# Patient Record
Sex: Female | Born: 1950 | Race: Black or African American | Hispanic: No | Smoking: Never smoker
Health system: Southern US, Community
[De-identification: ages and names within clinical notes are randomized; demographics above are authoritative.]

## PROBLEM LIST (undated history)

## (undated) DIAGNOSIS — F410 Panic disorder [episodic paroxysmal anxiety] without agoraphobia: Secondary | ICD-10-CM

## (undated) DIAGNOSIS — Z9889 Other specified postprocedural states: Secondary | ICD-10-CM

## (undated) DIAGNOSIS — J45909 Unspecified asthma, uncomplicated: Secondary | ICD-10-CM

## (undated) DIAGNOSIS — M1990 Unspecified osteoarthritis, unspecified site: Secondary | ICD-10-CM

## (undated) DIAGNOSIS — M8580 Other specified disorders of bone density and structure, unspecified site: Secondary | ICD-10-CM

## (undated) DIAGNOSIS — F32A Depression, unspecified: Secondary | ICD-10-CM

## (undated) DIAGNOSIS — E669 Obesity, unspecified: Secondary | ICD-10-CM

## (undated) DIAGNOSIS — I10 Essential (primary) hypertension: Secondary | ICD-10-CM

## (undated) DIAGNOSIS — K219 Gastro-esophageal reflux disease without esophagitis: Secondary | ICD-10-CM

## (undated) DIAGNOSIS — K649 Unspecified hemorrhoids: Secondary | ICD-10-CM

## (undated) DIAGNOSIS — R002 Palpitations: Secondary | ICD-10-CM

## (undated) DIAGNOSIS — E7800 Pure hypercholesterolemia, unspecified: Secondary | ICD-10-CM

## (undated) DIAGNOSIS — F419 Anxiety disorder, unspecified: Secondary | ICD-10-CM

## (undated) DIAGNOSIS — F329 Major depressive disorder, single episode, unspecified: Secondary | ICD-10-CM

## (undated) DIAGNOSIS — F4310 Post-traumatic stress disorder, unspecified: Secondary | ICD-10-CM

## (undated) DIAGNOSIS — T7840XA Allergy, unspecified, initial encounter: Secondary | ICD-10-CM

## (undated) HISTORY — DX: Palpitations: R00.2

## (undated) HISTORY — DX: Anxiety disorder, unspecified: F41.9

## (undated) HISTORY — PX: EYE SURGERY: SHX253

## (undated) HISTORY — DX: Allergy, unspecified, initial encounter: T78.40XA

## (undated) HISTORY — DX: Major depressive disorder, single episode, unspecified: F32.9

## (undated) HISTORY — DX: Other specified disorders of bone density and structure, unspecified site: M85.80

## (undated) HISTORY — DX: Essential (primary) hypertension: I10

## (undated) HISTORY — DX: Depression, unspecified: F32.A

## (undated) HISTORY — DX: Other specified postprocedural states: Z98.890

## (undated) HISTORY — DX: Unspecified asthma, uncomplicated: J45.909

## (undated) HISTORY — DX: Unspecified osteoarthritis, unspecified site: M19.90

## (undated) HISTORY — PX: KNEE SURGERY: SHX244

## (undated) HISTORY — DX: Unspecified hemorrhoids: K64.9

---

## 1998-06-30 ENCOUNTER — Other Ambulatory Visit: Admission: RE | Admit: 1998-06-30 | Discharge: 1998-06-30 | Payer: Self-pay | Admitting: Family Medicine

## 2000-03-20 ENCOUNTER — Emergency Department (HOSPITAL_COMMUNITY): Admission: EM | Admit: 2000-03-20 | Discharge: 2000-03-20 | Payer: Self-pay

## 2001-03-22 ENCOUNTER — Other Ambulatory Visit: Admission: RE | Admit: 2001-03-22 | Discharge: 2001-03-22 | Payer: Self-pay | Admitting: Family Medicine

## 2002-05-23 ENCOUNTER — Encounter (INDEPENDENT_AMBULATORY_CARE_PROVIDER_SITE_OTHER): Payer: Self-pay | Admitting: Specialist

## 2002-05-23 ENCOUNTER — Ambulatory Visit (HOSPITAL_COMMUNITY): Admission: RE | Admit: 2002-05-23 | Discharge: 2002-05-23 | Payer: Self-pay | Admitting: *Deleted

## 2003-01-08 ENCOUNTER — Encounter: Payer: Self-pay | Admitting: Orthopaedic Surgery

## 2003-01-08 ENCOUNTER — Ambulatory Visit (HOSPITAL_COMMUNITY): Admission: RE | Admit: 2003-01-08 | Discharge: 2003-01-08 | Payer: Self-pay | Admitting: Orthopaedic Surgery

## 2004-07-12 ENCOUNTER — Ambulatory Visit: Payer: Self-pay | Admitting: Internal Medicine

## 2004-07-19 ENCOUNTER — Ambulatory Visit: Payer: Self-pay

## 2004-07-19 ENCOUNTER — Ambulatory Visit: Payer: Self-pay | Admitting: Internal Medicine

## 2004-08-19 ENCOUNTER — Ambulatory Visit: Payer: Self-pay | Admitting: Internal Medicine

## 2005-03-03 ENCOUNTER — Other Ambulatory Visit: Admission: RE | Admit: 2005-03-03 | Discharge: 2005-03-03 | Payer: Self-pay | Admitting: Family Medicine

## 2005-04-27 ENCOUNTER — Ambulatory Visit (HOSPITAL_COMMUNITY): Admission: RE | Admit: 2005-04-27 | Discharge: 2005-04-27 | Payer: Self-pay | Admitting: Gastroenterology

## 2006-06-16 LAB — HM COLONOSCOPY: HM Colonoscopy: NORMAL

## 2006-08-21 ENCOUNTER — Encounter: Admission: RE | Admit: 2006-08-21 | Discharge: 2006-08-21 | Payer: Self-pay | Admitting: Family Medicine

## 2007-09-19 ENCOUNTER — Encounter: Admission: RE | Admit: 2007-09-19 | Discharge: 2007-09-19 | Payer: Self-pay | Admitting: Nurse Practitioner

## 2007-09-27 ENCOUNTER — Encounter: Admission: RE | Admit: 2007-09-27 | Discharge: 2007-09-27 | Payer: Self-pay | Admitting: Family Medicine

## 2008-06-24 LAB — HM PAP SMEAR: HM Pap smear: NORMAL

## 2008-10-14 LAB — HM MAMMOGRAPHY: HM Mammogram: NORMAL

## 2008-10-17 ENCOUNTER — Encounter: Admission: RE | Admit: 2008-10-17 | Discharge: 2008-10-17 | Payer: Self-pay | Admitting: Family Medicine

## 2009-10-19 ENCOUNTER — Encounter: Admission: RE | Admit: 2009-10-19 | Discharge: 2009-10-19 | Payer: Self-pay | Admitting: Family Medicine

## 2009-10-27 ENCOUNTER — Encounter: Admission: RE | Admit: 2009-10-27 | Discharge: 2009-10-27 | Payer: Self-pay | Admitting: Nurse Practitioner

## 2009-12-28 ENCOUNTER — Ambulatory Visit (HOSPITAL_COMMUNITY): Admission: RE | Admit: 2009-12-28 | Discharge: 2009-12-28 | Payer: Self-pay | Admitting: General Surgery

## 2009-12-28 ENCOUNTER — Encounter: Admission: RE | Admit: 2009-12-28 | Discharge: 2009-12-28 | Payer: Self-pay | Admitting: General Surgery

## 2010-06-06 ENCOUNTER — Encounter: Payer: Self-pay | Admitting: Family Medicine

## 2010-07-30 LAB — COMPREHENSIVE METABOLIC PANEL
Albumin: 3.5 g/dL (ref 3.5–5.2)
GFR calc Af Amer: >60 mL/min (ref >60)
Glucose, Bld: 100 mg/dL — ABNORMAL HIGH (ref 70–99)
Potassium: 4.9 mEq/L (ref 3.5–5.1)

## 2010-07-30 LAB — CBC
HCT: 41.6 % (ref 36.0–46.0)
Hemoglobin: 13.9 g/dL (ref 12.0–15.0)
MCH: 29.3 pg (ref 26.0–34.0)
MCV: 87.6 fL (ref 78.0–100.0)
Platelets: 219 10*3/uL (ref 150–400)
RBC: 4.75 MIL/uL (ref 3.87–5.11)
RDW: 14.1 % (ref 11.5–15.5)
WBC: 5.8 10*3/uL (ref 4.0–10.5)

## 2010-07-30 LAB — SURGICAL PCR SCREEN: MRSA, PCR: NEGATIVE

## 2010-07-30 LAB — DIFFERENTIAL
Eosinophils Relative: 1 % (ref 0–5)
Monocytes Absolute: 0.7 10*3/uL (ref 0.1–1.0)
Monocytes Relative: 12 % (ref 3–12)

## 2010-09-21 ENCOUNTER — Encounter: Payer: Self-pay | Admitting: Nurse Practitioner

## 2010-09-21 DIAGNOSIS — K222 Esophageal obstruction: Secondary | ICD-10-CM

## 2010-09-21 DIAGNOSIS — E785 Hyperlipidemia, unspecified: Secondary | ICD-10-CM | POA: Insufficient documentation

## 2010-09-21 DIAGNOSIS — F32A Depression, unspecified: Secondary | ICD-10-CM | POA: Insufficient documentation

## 2010-09-21 DIAGNOSIS — F329 Major depressive disorder, single episode, unspecified: Secondary | ICD-10-CM | POA: Insufficient documentation

## 2010-09-21 DIAGNOSIS — K649 Unspecified hemorrhoids: Secondary | ICD-10-CM | POA: Insufficient documentation

## 2010-09-21 DIAGNOSIS — E669 Obesity, unspecified: Secondary | ICD-10-CM | POA: Insufficient documentation

## 2010-09-21 DIAGNOSIS — M519 Unspecified thoracic, thoracolumbar and lumbosacral intervertebral disc disorder: Secondary | ICD-10-CM | POA: Insufficient documentation

## 2010-09-21 DIAGNOSIS — J45909 Unspecified asthma, uncomplicated: Secondary | ICD-10-CM | POA: Insufficient documentation

## 2010-09-21 DIAGNOSIS — K224 Dyskinesia of esophagus: Secondary | ICD-10-CM | POA: Insufficient documentation

## 2010-09-21 DIAGNOSIS — M1990 Unspecified osteoarthritis, unspecified site: Secondary | ICD-10-CM

## 2010-09-21 DIAGNOSIS — M8580 Other specified disorders of bone density and structure, unspecified site: Secondary | ICD-10-CM

## 2010-09-21 DIAGNOSIS — I10 Essential (primary) hypertension: Secondary | ICD-10-CM | POA: Insufficient documentation

## 2010-09-22 ENCOUNTER — Other Ambulatory Visit (HOSPITAL_COMMUNITY): Payer: Self-pay | Admitting: Orthopaedic Surgery

## 2010-09-22 DIAGNOSIS — M25571 Pain in right ankle and joints of right foot: Secondary | ICD-10-CM

## 2010-09-27 ENCOUNTER — Ambulatory Visit (HOSPITAL_COMMUNITY)
Admission: RE | Admit: 2010-09-27 | Discharge: 2010-09-27 | Disposition: A | Discharge status: Home / Self Care | Payer: PRIVATE HEALTH INSURANCE | Source: Ambulatory Visit | Attending: Orthopaedic Surgery | Admitting: Orthopaedic Surgery

## 2010-09-27 DIAGNOSIS — M25571 Pain in right ankle and joints of right foot: Secondary | ICD-10-CM

## 2010-09-27 DIAGNOSIS — M7750 Other enthesopathy of unspecified foot: Secondary | ICD-10-CM | POA: Insufficient documentation

## 2010-09-27 DIAGNOSIS — M25579 Pain in unspecified ankle and joints of unspecified foot: Secondary | ICD-10-CM | POA: Insufficient documentation

## 2010-10-01 NOTE — Op Note (Signed)
NAMESYNTHIA, FAIRBANK             ACCOUNT NO.:  [id number]   MEDICAL RECORD NO.:  [id number]          PATIENT TYPE:  AMB   LOCATION:  ENDO                         FACILITY:  [facility]   PHYSICIAN:  Shirley Friar, MDDATE OF BIRTH:  09/28/1950   DATE OF PROCEDURE:  04/27/2005  DATE OF DISCHARGE:                                 OPERATIVE REPORT   REFERRING PHYSICIAN:  Magnus Sinning. Rice, M.D.   INDICATIONS:  Heme-positive stool.   MEDICATIONS:  Fentanyl 100 mcg, Versed 10 mg.   FINDINGS:  Rectal exam revealed small external hemorrhoids that were not  bleeding but on attempted colonoscope insertion, there was bright red blood  on one of the external hemorrhoids that spontaneously resolved.  Digital  rectal exam was normal.  A pediatric adjustable colonoscope was inserted  into a fair-prep colon and advanced to the cecum, where the ileocecal valve  and appendiceal orifice were identified.  The terminal ileum was intubated  and was normal in appearance.  On careful withdrawal, the colonoscope  revealed no polyps and no mucosal abnormalities.  Retroflexion was done and  revealed small internal hemorrhoids.  The insufflated air was withdrawn as  much as possible on withdrawal.  The colonoscope was withdrawn and confirmed  the above findings.   ASSESSMENT:  1.  External and internal hemorrhoids.  2.  Heme-positive stool, likely due to hemorrhoids or from digital rectal      exam performed in the office.  Patient would likely benefit from repeat      Hemoccult check on spontaneously passed stools.   PLAN:  1.  High fiber diet.  2.  Hemorrhoidal cream, as needed.  3.  Check CBC and do a Hemoccult check on spontaneously passed stools.  If      this Hemoccult check is positive or if anemic, then will consider upper      endoscopy, but at this time, I see no indication to do an upper      endoscopy.  4.  Repeat colonoscopy in five years.  5.  Patient should follow up with her primary  care physician to have      Hemoccult cards checked and blood count done.  Will follow up with me as      needed.      Shirley Friar, MD  Electronically Signed     VCS/MEDQ  D:  04/27/2005  T:  04/27/2005  Job:  [id number]   cc:   Magnus Sinning. Rice, M.D.  Fax: [phone]

## 2011-02-22 ENCOUNTER — Other Ambulatory Visit (HOSPITAL_COMMUNITY): Payer: Self-pay | Admitting: Orthopaedic Surgery

## 2011-02-22 DIAGNOSIS — M25562 Pain in left knee: Secondary | ICD-10-CM

## 2011-02-24 ENCOUNTER — Ambulatory Visit (HOSPITAL_COMMUNITY)
Admission: RE | Admit: 2011-02-24 | Discharge: 2011-02-24 | Disposition: A | Discharge status: Home / Self Care | Payer: PRIVATE HEALTH INSURANCE | Source: Ambulatory Visit | Attending: Orthopaedic Surgery | Admitting: Orthopaedic Surgery

## 2011-02-24 DIAGNOSIS — M25562 Pain in left knee: Secondary | ICD-10-CM

## 2011-02-24 DIAGNOSIS — M25569 Pain in unspecified knee: Secondary | ICD-10-CM | POA: Insufficient documentation

## 2012-08-08 ENCOUNTER — Ambulatory Visit (INDEPENDENT_AMBULATORY_CARE_PROVIDER_SITE_OTHER): Payer: Medicare Other | Admitting: Nurse Practitioner

## 2012-08-08 ENCOUNTER — Encounter: Payer: Self-pay | Admitting: Nurse Practitioner

## 2012-08-08 VITALS — BP 132/88 | HR 89 | Temp 96.6°F | Ht 66.0 in | Wt 273.0 lb

## 2012-08-08 DIAGNOSIS — I10 Essential (primary) hypertension: Secondary | ICD-10-CM

## 2012-08-08 DIAGNOSIS — F329 Major depressive disorder, single episode, unspecified: Secondary | ICD-10-CM

## 2012-08-08 DIAGNOSIS — E669 Obesity, unspecified: Secondary | ICD-10-CM

## 2012-08-08 DIAGNOSIS — E785 Hyperlipidemia, unspecified: Secondary | ICD-10-CM

## 2012-08-08 DIAGNOSIS — F411 Generalized anxiety disorder: Secondary | ICD-10-CM

## 2012-08-08 DIAGNOSIS — G4700 Insomnia, unspecified: Secondary | ICD-10-CM

## 2012-08-08 LAB — COMPLETE METABOLIC PANEL WITH GFR
AST: 12 U/L (ref 0–37)
Albumin: 3.9 g/dL (ref 3.5–5.2)
Alkaline Phosphatase: 51 U/L (ref 39–117)
BUN: 11 mg/dL (ref 6–23)
CO2: 24 mEq/L (ref 19–32)
Creat: 0.92 mg/dL (ref 0.50–1.10)
GFR, Est African American: 78 mL/min
GFR, Est Non African American: 67 mL/min
Glucose, Bld: 103 mg/dL — ABNORMAL HIGH (ref 70–99)
Potassium: 4.2 mEq/L (ref 3.5–5.3)
Sodium: 134 mEq/L — ABNORMAL LOW (ref 135–145)
Total Protein: 6.5 g/dL (ref 6.0–8.3)

## 2012-08-08 MED ORDER — ALPRAZOLAM 0.5 MG PO TABS: 0.5000 mg | ORAL_TABLET | Freq: Three times a day (TID) | ORAL | Status: DC | PRN

## 2012-08-08 NOTE — Progress Notes (Signed)
Subjective:    Patient ID: Lauren Harmon, female    DOB: 09/15/1950, 62 y.o.   MRN: [id number]  Hypertension This is a chronic problem. The current episode started more than 1 year ago. The problem has been waxing and waning since onset. The problem is controlled. Pertinent negatives include no blurred vision, chest pain, headaches, palpitations, peripheral edema or shortness of breath. Risk factors for coronary artery disease include dyslipidemia, obesity and post-menopausal state. Past treatments include beta blockers, calcium channel blockers and diuretics. The current treatment provides moderate improvement. Compliance problems include diet and exercise.   Hyperlipidemia This is a chronic problem. The problem is controlled. She has no history of obesity. There are no known factors aggravating her hyperlipidemia. Pertinent negatives include no chest pain, leg pain, myalgias or shortness of breath. Current antihyperlipidemic treatment includes statins. The current treatment provides significant improvement of lipids. Compliance problems include adherence to diet and adherence to exercise.  Risk factors for coronary artery disease include hypertension.  Asthma There is no cough, hoarse voice, shortness of breath or wheezing. This is a chronic problem. The current episode started more than 1 year ago. The problem has been unchanged. Pertinent negatives include no chest pain, headaches, myalgias, rhinorrhea, sneezing, sore throat or trouble swallowing. Her symptoms are aggravated by lying down. Her symptoms are alleviated by nothing. Her past medical history is significant for asthma.  Insomnia Valium 5mg   2 QHS and patient says it is not helping. Wakes up at night. Gets jittery GAD Valium during the day doesn't help. Still stays very nervous. Wants to try something different.  Depression Venlafaxine 75mg  TID. Working ok. May change dose later so doesn't have to take 3X per day.   Review of  Systems  Constitutional: Negative.   HENT: Negative.  Negative for sore throat, hoarse voice, rhinorrhea, sneezing and trouble swallowing.   Eyes: Negative.  Negative for blurred vision.  Respiratory: Negative for cough, shortness of breath and wheezing.   Cardiovascular: Negative for chest pain and palpitations.  Gastrointestinal: Positive for nausea (patient describes as a nervous stomach).  Genitourinary: Negative.   Musculoskeletal: Negative.  Negative for myalgias.  Skin: Negative.   Allergic/Immunologic: Negative.   Neurological: Negative for headaches.  Psychiatric/Behavioral: Positive for sleep disturbance and agitation.   Allergies  Allergen Reactions  . Wellbutrin (Bupropion Hcl) Nausea Only    Outpatient Encounter Prescriptions as of 08/08/2012  Medication Sig Dispense Refill  . albuterol (PROAIR HFA) 108 (90 BASE) MCG/ACT inhaler Inhale 2 puffs into the lungs every 6 (six) hours as needed.        Marland Kitchen amLODipine-benazepril (LOTREL) 5-20 MG per capsule Take 1 capsule by mouth daily.        Marland Kitchen aspirin 325 MG tablet Take 325 mg by mouth daily.        Marland Kitchen atorvastatin (LIPITOR) 40 MG tablet Take 40 mg by mouth daily.        . cyclobenzaprine (FLEXERIL) 10 MG tablet Take 10 mg by mouth 3 (three) times daily as needed.        . diazepam (VALIUM) 5 MG tablet Take 5 mg by mouth every 12 (twelve) hours as needed.        Marland Kitchen esomeprazole (NEXIUM) 40 MG capsule Take 40 mg by mouth daily before breakfast.        . furosemide (LASIX) 20 MG tablet Take 20 mg by mouth 2 (two) times daily.        Marland Kitchen HYDROcodone-acetaminophen (VICODIN ES) 7.5-750 MG  per tablet Take 1 tablet by mouth every 8 (eight) hours as needed.        . montelukast (SINGULAIR) 10 MG tablet Take 10 mg by mouth at bedtime.       . theophylline (UNIPHYL) 400 MG 24 hr tablet Take 400 mg by mouth daily.        Marland Kitchen venlafaxine (EFFEXOR-XR) 150 MG 24 hr capsule Take 150 mg by mouth daily.        . celecoxib (CELEBREX) 200 MG capsule  Take 200 mg by mouth 2 (two) times daily.         No facility-administered encounter medications on file as of 08/08/2012.    Past Medical History  Diagnosis Date  . Anxiety   . H/O: knee surgery   . Hypertension   . Allergy   . Depression   . Asthma   . Osteoarthritis   . Palpitations   . Osteopenia   . Hemorrhoid     Past Surgical History  Procedure Laterality Date  . Eye surgery      History   Social History  . Marital Status: Divorced    Spouse Name: N/A    Number of Children: N/A  . Years of Education: N/A   Occupational History  . Not on file.   Social History Main Topics  . Smoking status: Never Smoker   . Smokeless tobacco: Not on file  . Alcohol Use: No  . Drug Use: No  . Sexually Active: No   Other Topics Concern  . Not on file   Social History Narrative  . No narrative on file          Objective:   Physical Exam  Constitutional: She is oriented to person, place, and time. She appears well-nourished.  HENT:  Head: Normocephalic.  Right Ear: External ear normal.  Left Ear: External ear normal.  Nose: Nose normal.  Mouth/Throat: Oropharynx is clear and moist.  Eyes: Conjunctivae and EOM are normal. Pupils are equal, round, and reactive to light.  Neck: Normal range of motion. Neck supple. No JVD present. Carotid bruit is not present.  Cardiovascular: Normal rate, regular rhythm, normal heart sounds and intact distal pulses.   Pulmonary/Chest: Effort normal and breath sounds normal.  Abdominal: Soft. Bowel sounds are normal. She exhibits no mass. There is no tenderness.  Musculoskeletal: Normal range of motion.  Lymphadenopathy:    She has no cervical adenopathy.  Neurological: She is alert and oriented to person, place, and time.  Skin: Skin is warm and dry.  Psychiatric: Her behavior is normal. Judgment and thought content normal.  Affect sad.    BP 146/89  Pulse 89  Temp(Src) 96.6 F (35.9 C) (Oral)  LMP 08/09/2002         Assessment & Plan:  Health Maintenance updated .Hypertension - Plan: COMPLETE METABOLIC PANEL WITH GFR  Hyperlipidemia - Plan: NMR Lipoprofile with Lipids  Depression  Obesity  GAD (generalized anxiety disorder) - Plan: ALPRAZolam (XANAX) 0.5 MG tablet  Insomnia - Plan: ALPRAZolam (XANAX) 0.5 MG tablet  Continue all meds Valium Changed to xanax- Patient will let me know if helps Stress management discussed Bedtime ritual discussed  Mary-Margaret [name], FNP

## 2012-08-08 NOTE — Patient Instructions (Signed)
Diet for Gastroesophageal Reflux Disease, Adult Reflux (acid reflux) is when acid from your stomach flows up into the esophagus. When acid comes in contact with the esophagus, the acid causes irritation and soreness (inflammation) in the esophagus. When reflux happens often or so severely that it causes damage to the esophagus, it is called gastroesophageal reflux disease (GERD). Nutrition therapy can help ease the discomfort of GERD. FOODS OR DRINKS TO AVOID OR LIMIT  Smoking or chewing tobacco. Nicotine is one of the most potent stimulants to acid production in the gastrointestinal tract.  Caffeinated and decaffeinated coffee and black tea.  Regular or low-calorie carbonated beverages or energy drinks (caffeine-free carbonated beverages are allowed).   Strong spices, such as black pepper, white pepper, red pepper, cayenne, curry powder, and chili powder.  Peppermint or spearmint.  Chocolate.  High-fat foods, including meats and fried foods. Extra added fats including oils, butter, salad dressings, and nuts. Limit these to less than 8 tsp per day.  Fruits and vegetables if they are not tolerated, such as citrus fruits or tomatoes.  Alcohol.  Any food that seems to aggravate your condition. If you have questions regarding your diet, call your caregiver or a registered dietitian. OTHER THINGS THAT MAY HELP GERD INCLUDE:   Eating your meals slowly, in a relaxed setting.  Eating 5 to 6 small meals per day instead of 3 large meals.  Eliminating food for a period of time if it causes distress.  Not lying down until 3 hours after eating a meal.  Keeping the head of your bed raised 6 to 9 inches (15 to 23 cm) by using a foam wedge or blocks under the legs of the bed. Lying flat may make symptoms worse.  Being physically active. Weight loss may be helpful in reducing reflux in overweight or obese adults.  Wear loose fitting clothing EXAMPLE MEAL PLAN This meal plan is approximately  2,000 calories based on https://www.bernard.org/ meal planning guidelines. Breakfast   cup cooked oatmeal.  1 cup strawberries.  1 cup low-fat milk.  1 oz almonds. Snack  1 cup cucumber slices.  6 oz yogurt (made from low-fat or fat-free milk). Lunch  2 slice whole-wheat bread.  2 oz sliced [place].  2 tsp mayonnaise.  1 cup blueberries.  1 cup snap peas. Snack  6 whole-wheat crackers.  1 oz string cheese. Dinner   cup brown rice.  1 cup mixed veggies.  1 tsp olive oil.  3 oz grilled fish. Document Released: 05/02/2005 Document Revised: 07/25/2011 Document Reviewed: 03/18/2011 [facility] Patient Information 2013 [place], [place]. Depression, Adult Depression refers to feeling sad, low, down in the dumps, blue, gloomy, or empty. In general, there are two kinds of depression: 1. Depression that we all experience from time to time because of upsetting life experiences, including the loss of a job or the ending of a relationship (normal sadness or normal grief). This kind of depression is considered normal, is short lived, and resolves within a few days to 2 weeks. (Depression experienced after the loss of a loved one is called bereavement. Bereavement often lasts longer than 2 weeks but normally gets better with time.) 2. Clinical depression, which lasts longer than normal sadness or normal grief or interferes with your ability to function at home, at work, and in school. It also interferes with your personal relationships. It affects almost every aspect of your life. Clinical depression is an illness. Symptoms of depression also can be caused by conditions other than normal sadness  and grief or clinical depression. Examples of these conditions are listed as follows:  Physical illness Some physical illnesses, including underactive thyroid gland (hypothyroidism), severe anemia, specific types of cancer, diabetes, uncontrolled seizures, heart and lung problems, strokes, and chronic  pain are commonly associated with symptoms of depression.  Side effects of some prescription medicine In some people, certain types of prescription medicine can cause symptoms of depression.  Substance abuse Abuse of alcohol and illicit drugs can cause symptoms of depression. SYMPTOMS Symptoms of normal sadness and normal grief include the following:  Feeling sad or crying for short periods of time.  Not caring about anything (apathy).  Difficulty sleeping or sleeping too much.  No longer able to enjoy the things you used to enjoy.  Desire to be by oneself all the time (social isolation).  Lack of energy or motivation.  Difficulty concentrating or remembering.  Change in appetite or weight.  Restlessness or agitation. Symptoms of clinical depression include the same symptoms of normal sadness or normal grief and also the following symptoms:  Feeling sad or crying all the time.  Feelings of guilt or worthlessness.  Feelings of hopelessness or helplessness.  Thoughts of suicide or the desire to harm yourself (suicidal ideation).  Loss of touch with reality (psychotic symptoms). Seeing or hearing things that are not real (hallucinations) or having false beliefs about your life or the people around you (delusions and paranoia). DIAGNOSIS  The diagnosis of clinical depression usually is based on the severity and duration of the symptoms. Your caregiver also will ask you questions about your medical history and substance use to find out if physical illness, use of prescription medicine, or substance abuse is causing your depression. Your caregiver also may order blood tests. TREATMENT  Typically, normal sadness and normal grief do not require treatment. However, sometimes antidepressant medicine is prescribed for bereavement to ease the depressive symptoms until they resolve. The treatment for clinical depression depends on the severity of your symptoms but typically includes  antidepressant medicine, counseling with a mental health professional, or a combination of both. Your caregiver will help to determine what treatment is best for you. Depression caused by physical illness usually goes away with appropriate medical treatment of the illness. If prescription medicine is causing depression, talk with your caregiver about stopping the medicine, decreasing the dose, or substituting another medicine. Depression caused by abuse of alcohol or illicit drugs abuse goes away with abstinence from these substances. Some adults need professional help in order to stop drinking or using drugs. SEEK IMMEDIATE CARE IF:  You have thoughts about hurting yourself or others.  You lose touch with reality (have psychotic symptoms).  You are taking medicine for depression and have a serious side effect. FOR MORE INFORMATION National Alliance on Mental Illness: www.nami.[employer]: http://www.maynard.net/ Document Released: 04/29/2000 Document Revised: 11/01/2011 Document Reviewed: 08/01/2011 [facility] Patient Information 2013 [place], [place].

## 2012-08-09 LAB — NMR LIPOPROFILE WITH LIPIDS
HDL Particle Number: 51 umol/L (ref >=30.5)
LDL (calc): 73 mg/dL (ref <100)
Small LDL Particle Number: 471 nmol/L (ref <=527)
Triglycerides: 50 mg/dL (ref <150)
VLDL Size: 48.2 nm — ABNORMAL HIGH (ref >=46.6)

## 2012-08-14 ENCOUNTER — Telehealth: Payer: Self-pay | Admitting: Nurse Practitioner

## 2012-08-14 NOTE — Telephone Encounter (Signed)
PT STILL C/O PROBLEMS WITH STOMACH FOR 1 MONTH. UPSET  STOMACH IN THE MORNING. NAUSEA IN THE MORNING. BLACK STOOL. SAW YOU LAST WEEK. WHAT DOES SHE NEED TO DO.

## 2012-08-14 NOTE — Telephone Encounter (Signed)
Just seen patient several days ago and we discussed this. GERD- Pateint says she feels better after she eats something. Told patient to put saltine crackers at bedside and eat a few before she gets out of bed in AM.

## 2012-08-14 NOTE — Telephone Encounter (Signed)
PLEASE ADVICE

## 2012-08-15 NOTE — Telephone Encounter (Signed)
Pt just wants to go to the er to be seen. She's worried about the black stool, and otc stomach relief is not helping her "nervous stomach".

## 2012-08-16 ENCOUNTER — Telehealth: Payer: Self-pay | Admitting: Nurse Practitioner

## 2012-08-16 ENCOUNTER — Other Ambulatory Visit: Payer: Self-pay | Admitting: Nurse Practitioner

## 2012-08-16 DIAGNOSIS — I712 Thoracic aortic aneurysm, without rupture: Secondary | ICD-10-CM

## 2012-08-16 NOTE — Telephone Encounter (Signed)
Spoke with patient and sent patient for CT of chest. Patient is aware.

## 2012-08-16 NOTE — Telephone Encounter (Signed)
Please Advise

## 2012-08-21 ENCOUNTER — Encounter: Payer: Self-pay | Admitting: Family Medicine

## 2012-08-22 ENCOUNTER — Telehealth: Payer: Self-pay | Admitting: Nurse Practitioner

## 2012-08-22 NOTE — Telephone Encounter (Signed)
Please advise 

## 2012-08-23 NOTE — Telephone Encounter (Signed)
Lauren Harmon talked to patient today and informed patient that her insurance is pending review for approval of CT  As soon as we get approval we will schedule her CT

## 2012-08-24 ENCOUNTER — Other Ambulatory Visit (HOSPITAL_COMMUNITY): Payer: PRIVATE HEALTH INSURANCE

## 2012-08-29 ENCOUNTER — Telehealth: Payer: Self-pay | Admitting: Nurse Practitioner

## 2012-08-29 ENCOUNTER — Ambulatory Visit (HOSPITAL_COMMUNITY): Payer: PRIVATE HEALTH INSURANCE

## 2012-08-30 ENCOUNTER — Telehealth: Payer: Self-pay | Admitting: Nurse Practitioner

## 2012-08-30 NOTE — Telephone Encounter (Signed)
Please advise 

## 2012-08-30 NOTE — Telephone Encounter (Signed)
No patient didn't say she needed it uped! At appointment

## 2012-09-03 ENCOUNTER — Telehealth: Payer: Self-pay | Admitting: *Deleted

## 2012-09-04 ENCOUNTER — Other Ambulatory Visit: Payer: Self-pay | Admitting: Nurse Practitioner

## 2012-09-04 DIAGNOSIS — G4700 Insomnia, unspecified: Secondary | ICD-10-CM

## 2012-09-04 DIAGNOSIS — F411 Generalized anxiety disorder: Secondary | ICD-10-CM

## 2012-09-04 MED ORDER — ALPRAZOLAM 0.5 MG PO TABS: 0.5000 mg | ORAL_TABLET | Freq: Three times a day (TID) | ORAL | Status: DC | PRN

## 2012-09-04 MED ORDER — VENLAFAXINE HCL ER 75 MG PO CP24: 75.0000 mg | ORAL_CAPSULE | Freq: Every day | ORAL | Status: DC

## 2012-09-04 MED ORDER — VENLAFAXINE HCL ER 75 MG PO CP24: ORAL_CAPSULE | ORAL | Status: DC

## 2012-09-04 NOTE — Telephone Encounter (Signed)
Na- 4/16-pt may call back if appt still needed

## 2012-09-04 NOTE — Telephone Encounter (Signed)
Mmm to address 

## 2012-09-04 NOTE — Telephone Encounter (Signed)
PT AWARE. MMM CAN YOU PLEASE SIGN OFF ON THE ALPRAZOLAM AND I HAVE ALREADY CALLED IT IN. IT JUST WONT LET ME CLOSE ENCOUNTER

## 2012-09-04 NOTE — Telephone Encounter (Signed)
Patient was told has to take to doses of effexor. A 150mg  and a 75mg .

## 2012-09-04 NOTE — Telephone Encounter (Signed)
Pt wanted antidepressant dose increased but MMM says it wasn't discussed at last office visit and it can be addressed at next visit.  Patient aware of this.

## 2012-09-04 NOTE — Telephone Encounter (Signed)
Last seen 04/06/12   Do not see Alprazolam on med list  Been getting Valium 5 mg one BID and 2 qhs  If you approve have nurse call in and notify patient

## 2012-09-04 NOTE — Telephone Encounter (Signed)
Call in Xanax rx. Patient aware

## 2012-09-05 ENCOUNTER — Ambulatory Visit (HOSPITAL_COMMUNITY)
Admission: RE | Admit: 2012-09-05 | Discharge: 2012-09-05 | Disposition: A | Discharge status: Home / Self Care | Payer: PRIVATE HEALTH INSURANCE | Source: Ambulatory Visit | Attending: Nurse Practitioner | Admitting: Nurse Practitioner

## 2012-09-05 ENCOUNTER — Other Ambulatory Visit: Payer: Self-pay

## 2012-09-05 DIAGNOSIS — I712 Thoracic aortic aneurysm, without rupture, unspecified: Secondary | ICD-10-CM | POA: Insufficient documentation

## 2012-09-05 DIAGNOSIS — I10 Essential (primary) hypertension: Secondary | ICD-10-CM | POA: Insufficient documentation

## 2012-09-05 MED ORDER — VENLAFAXINE HCL ER 75 MG PO CP24: ORAL_CAPSULE | ORAL | Status: DC

## 2012-09-05 MED ORDER — IOHEXOL 300 MG/ML  SOLN
80.0000 mL | Freq: Once | INTRAMUSCULAR | Status: AC | PRN
  Administered 2012-09-05: 80 mL via INTRAVENOUS

## 2012-09-07 ENCOUNTER — Other Ambulatory Visit: Payer: Self-pay | Admitting: Family Medicine

## 2012-09-10 NOTE — Telephone Encounter (Signed)
LAST REFILL 08/08/12. LAST OV 04/06/12

## 2012-09-24 NOTE — Telephone Encounter (Signed)
Pt has not been notified of ct results.

## 2012-09-24 NOTE — Telephone Encounter (Signed)
negative

## 2012-09-24 NOTE — Telephone Encounter (Signed)
Pt aware of results 

## 2012-09-24 NOTE — Telephone Encounter (Signed)
This has already been addressed. Why am I getting again?

## 2012-09-26 ENCOUNTER — Other Ambulatory Visit: Payer: Self-pay | Admitting: Nurse Practitioner

## 2012-09-26 NOTE — Telephone Encounter (Signed)
WEEK EARLY ON XANAX. IF APPROVED WILL NEED TO CHANGE TO PHONE IN. LAST RF 09/04/12 FOR #90 AT [facility].

## 2012-09-27 ENCOUNTER — Other Ambulatory Visit: Payer: Self-pay | Admitting: Nurse Practitioner

## 2012-09-27 NOTE — Telephone Encounter (Signed)
Pt aware of meds called [facility]

## 2012-09-27 NOTE — Telephone Encounter (Signed)
Please phone in Rx for xanax

## 2012-10-05 ENCOUNTER — Other Ambulatory Visit: Payer: Self-pay | Admitting: Nurse Practitioner

## 2012-10-23 ENCOUNTER — Other Ambulatory Visit: Payer: Self-pay | Admitting: Nurse Practitioner

## 2012-10-24 ENCOUNTER — Other Ambulatory Visit: Payer: Self-pay | Admitting: Nurse Practitioner

## 2012-10-25 NOTE — Telephone Encounter (Signed)
Last seen 08/08/12, last filled 09/16/12. [facility]

## 2012-10-25 NOTE — Telephone Encounter (Signed)
Called into pharmacy

## 2012-10-25 NOTE — Telephone Encounter (Signed)
Please call in rx for alprazolam 0 refills

## 2012-10-26 ENCOUNTER — Ambulatory Visit (INDEPENDENT_AMBULATORY_CARE_PROVIDER_SITE_OTHER): Payer: Medicare Other | Admitting: Nurse Practitioner

## 2012-10-26 ENCOUNTER — Encounter: Payer: Self-pay | Admitting: Nurse Practitioner

## 2012-10-26 VITALS — BP 160/94 | HR 99 | Temp 97.8°F | Ht 64.0 in | Wt 273.0 lb

## 2012-10-26 DIAGNOSIS — F411 Generalized anxiety disorder: Secondary | ICD-10-CM

## 2012-10-26 DIAGNOSIS — G4700 Insomnia, unspecified: Secondary | ICD-10-CM

## 2012-10-26 MED ORDER — ALPRAZOLAM 1 MG PO TABS: 1.0000 mg | ORAL_TABLET | Freq: Every evening | ORAL | Status: DC | PRN

## 2012-10-26 MED ORDER — ZOLPIDEM TARTRATE 5 MG PO TABS: 5.0000 mg | ORAL_TABLET | Freq: Every evening | ORAL | Status: DC | PRN

## 2012-10-26 MED ORDER — ALPRAZOLAM 1 MG PO TABS: 1.0000 mg | ORAL_TABLET | Freq: Three times a day (TID) | ORAL | Status: DC | PRN

## 2012-10-26 NOTE — Progress Notes (Signed)
  Subjective:    Patient ID: Lauren Harmon, female    DOB: 12-16-1950, 62 y.o.   MRN: [id number]  HPI Patient in today to discuss her nerves- She has been having problems since her boyfriend died 2 years ago- we have tried many things and switched many meds around- She is currently on Xanax 0.5 TID and effexor 75mg  TID. SHe says that has a nervous stomach in the mornings- Wakes her up every  morning around 4 Am. Not sleeping good at all- Xanax worked good for a month but now not helping.     Review of Systems  All other systems reviewed and are negative.       Objective:   Physical Exam  Constitutional: She is oriented to person, place, and time. She appears well-developed and well-nourished.  Cardiovascular: Normal rate and normal heart sounds.   Pulmonary/Chest: Effort normal and breath sounds normal.  Neurological: She is alert and oriented to person, place, and time.  Psychiatric: Judgment and thought content normal. Her mood appears anxious. Her affect is angry. She is agitated. Cognition and memory are normal. She exhibits a depressed mood.     BP 160/94  Pulse 99  Temp(Src) 97.8 F (36.6 C) (Oral)  Ht 5\' 4"  (1.626 m)  Wt 273 lb (123.832 kg)  BMI 46.84 kg/m2  LMP 08/09/2002      Assessment & Plan:  1. GAD (generalized anxiety disorder) Patient not taking meds as she should- She expexts the meds to fix everything and I can't get her to understand that some things she needs to do. Increase xanax to 1mg  TID- take at 7am- 1pm- and 7pm  2. Insomnia ambien 5 mg- take at 9pm -10pm nightly Bedtime ritual  Mary-Margaret [name], FNP

## 2012-10-26 NOTE — Patient Instructions (Signed)

## 2012-10-26 NOTE — Addendum Note (Signed)
Addended by: Bennie Pierini on: 10/26/2012 09:37 AM   Modules accepted: Orders, Medications

## 2012-10-30 ENCOUNTER — Other Ambulatory Visit: Payer: Self-pay | Admitting: Nurse Practitioner

## 2012-11-09 ENCOUNTER — Other Ambulatory Visit (INDEPENDENT_AMBULATORY_CARE_PROVIDER_SITE_OTHER): Payer: Medicare Other

## 2012-11-09 ENCOUNTER — Encounter: Payer: Medicare Other | Admitting: Nurse Practitioner

## 2012-11-09 DIAGNOSIS — E785 Hyperlipidemia, unspecified: Secondary | ICD-10-CM

## 2012-11-09 LAB — NMR LIPOPROFILE WITH LIPIDS
HDL Particle Number: 47.3 umol/L (ref >=30.5)
HDL-C: 69 mg/dL (ref >=40)
LDL Size: 21.1 nm (ref >20.5)
Large HDL-P: 12.4 umol/L (ref >=4.8)
Large VLDL-P: 2.4 nmol/L (ref <=2.7)
Small LDL Particle Number: 421 nmol/L (ref <=527)

## 2012-11-09 LAB — COMPREHENSIVE METABOLIC PANEL
ALT: 12 U/L (ref 0–35)
AST: 14 U/L (ref 0–37)
Albumin: 3.9 g/dL (ref 3.5–5.2)
Alkaline Phosphatase: 45 U/L (ref 39–117)
Chloride: 99 mEq/L (ref 96–112)
Potassium: 4.3 mEq/L (ref 3.5–5.3)
Sodium: 134 mEq/L — ABNORMAL LOW (ref 135–145)
Total Protein: 6.5 g/dL (ref 6.0–8.3)

## 2012-11-09 NOTE — Progress Notes (Signed)
Pt came in for labs only 

## 2012-11-09 NOTE — Progress Notes (Signed)
  Subjective:    Patient ID: Lauren Harmon, female    DOB: Oct 01, 1950, 62 y.o.   MRN: [id number]  HPI    Review of Systems     Objective:   Physical Exam        Assessment & Plan:  Labs only encounter

## 2012-11-14 ENCOUNTER — Telehealth: Payer: Self-pay | Admitting: Nurse Practitioner

## 2012-11-14 MED ORDER — PROMETHAZINE HCL 12.5 MG PO TABS: 12.5000 mg | ORAL_TABLET | Freq: Three times a day (TID) | ORAL | Status: DC | PRN

## 2012-11-14 NOTE — Telephone Encounter (Signed)
Phenergan rx sent to pharmacy 

## 2012-11-20 ENCOUNTER — Other Ambulatory Visit: Payer: Self-pay | Admitting: Nurse Practitioner

## 2012-11-22 ENCOUNTER — Telehealth: Payer: Self-pay | Admitting: Nurse Practitioner

## 2012-11-22 NOTE — Telephone Encounter (Signed)
Last seen and filled 10/26/12. [facility]

## 2012-11-22 NOTE — Telephone Encounter (Signed)
rx corrected with pharmacy

## 2012-11-22 NOTE — Telephone Encounter (Signed)
Please call in xanax rx with 1 refill 

## 2012-11-22 NOTE — Telephone Encounter (Signed)
Called in by [place] R.

## 2012-11-27 ENCOUNTER — Other Ambulatory Visit: Payer: Self-pay | Admitting: Nurse Practitioner

## 2012-11-28 NOTE — Telephone Encounter (Signed)
Ordered 11/14/12, but only for #20

## 2012-11-29 ENCOUNTER — Other Ambulatory Visit: Payer: Self-pay | Admitting: Nurse Practitioner

## 2012-12-05 ENCOUNTER — Other Ambulatory Visit: Payer: Self-pay | Admitting: Nurse Practitioner

## 2012-12-10 ENCOUNTER — Telehealth: Payer: Self-pay | Admitting: *Deleted

## 2012-12-10 NOTE — Telephone Encounter (Signed)
Will have to be seen to discuss 

## 2012-12-10 NOTE — Telephone Encounter (Signed)
PT SAID SHE STILL HAVING PANIC ATTACKS.

## 2012-12-11 ENCOUNTER — Telehealth: Payer: Self-pay | Admitting: Nurse Practitioner

## 2012-12-11 MED ORDER — VENLAFAXINE HCL ER 150 MG PO CP24: 150.0000 mg | ORAL_CAPSULE | Freq: Two times a day (BID) | ORAL | Status: DC

## 2012-12-11 NOTE — Telephone Encounter (Signed)
rx sent to pharmacy- will see if insurance will cover change- Please explain to her that she will only be taking BID now instead of TID

## 2012-12-11 NOTE — Telephone Encounter (Signed)
Since we had to decrease the mg of the effexor she is having more panic attacks. She wants to put it back up to the effexor xr at 150 what she was on previously.

## 2012-12-12 ENCOUNTER — Telehealth: Payer: Self-pay | Admitting: Nurse Practitioner

## 2012-12-12 NOTE — Telephone Encounter (Signed)
Pt aware of new med directions

## 2012-12-12 NOTE — Telephone Encounter (Signed)
Dup note  

## 2012-12-13 ENCOUNTER — Telehealth: Payer: Self-pay | Admitting: Nurse Practitioner

## 2012-12-13 NOTE — Telephone Encounter (Signed)
Wanted to know if she can take her effexor tid with her nerve meds. Advised to take only bid per original order. Patient verbalizes understanding

## 2012-12-17 ENCOUNTER — Other Ambulatory Visit: Payer: Self-pay | Admitting: Nurse Practitioner

## 2012-12-18 NOTE — Telephone Encounter (Signed)
Patient has already spoken with somebody. She said she was good on medication

## 2012-12-21 ENCOUNTER — Encounter: Payer: Self-pay | Admitting: Nurse Practitioner

## 2012-12-21 ENCOUNTER — Ambulatory Visit (INDEPENDENT_AMBULATORY_CARE_PROVIDER_SITE_OTHER): Payer: Medicare Other | Admitting: Nurse Practitioner

## 2012-12-21 VITALS — BP 155/97 | HR 97 | Temp 96.9°F | Ht 64.0 in | Wt 273.0 lb

## 2012-12-21 DIAGNOSIS — L03211 Cellulitis of face: Secondary | ICD-10-CM

## 2012-12-21 DIAGNOSIS — L500 Allergic urticaria: Secondary | ICD-10-CM

## 2012-12-21 MED ORDER — PREDNISONE 20 MG PO TABS: ORAL_TABLET | ORAL | Status: DC

## 2012-12-21 MED ORDER — METHYLPREDNISOLONE ACETATE 80 MG/ML IJ SUSP
80.0000 mg | Freq: Once | INTRAMUSCULAR | Status: AC
  Administered 2012-12-21: 80 mg via INTRAMUSCULAR

## 2012-12-21 MED ORDER — CIPROFLOXACIN HCL 500 MG PO TABS: 500.0000 mg | ORAL_TABLET | Freq: Two times a day (BID) | ORAL | Status: DC

## 2012-12-21 NOTE — Progress Notes (Signed)
  Subjective:    Patient ID: Ander Slade, female    DOB: Jan 21, 1951, 62 y.o.   MRN: [id number]  HPI Patient in C/o left facial swelling and whelps on left upper arm- Got stung by a yellow jacket on her left side of face last Saturday and was red- Didn't start swelling until last night- woke up this morning with increased swelling and itching.    Review of Systems  Constitutional: Negative for fever.  HENT: Positive for facial swelling (left side). Negative for drooling, trouble swallowing and voice change.   Respiratory: Negative for choking and shortness of breath.   Cardiovascular: Negative for chest pain, palpitations and leg swelling.  Skin: Positive for rash.       Objective:   Physical Exam  Constitutional: She is oriented to person, place, and time. She appears well-developed and well-nourished.  HENT:  right facial swelling with lower lid drooping and left upper and lower lip swelling  Eyes: Conjunctivae and EOM are normal. Pupils are equal, round, and reactive to light.  Cardiovascular: Normal rate and normal heart sounds.   Pulmonary/Chest: Effort normal and breath sounds normal.  Neurological: She is alert and oriented to person, place, and time.  Skin:  Erythematous maculo papular lesion on left cheek. Large whelp left upper arm and right upper inner thigh.     BP 155/97  Pulse 97  Temp(Src) 96.9 F (36.1 C) (Oral)  Ht 5\' 4"  (1.626 m)  Wt 273 lb (123.832 kg)  BMI 46.84 kg/m2  LMP 08/09/2002       Assessment & Plan:  1. Allergic urticaria benadrly OTC Q6 hours for itching Avoid scratching - methylPREDNISolone acetate (DEPO-MEDROL) injection 80 mg; Inject 1 mL (80 mg total) into the muscle once. - predniSONE (DELTASONE) 20 MG tablet; 2 pills sametime daily for 5 days-DO NOT START UNTIL SATURDAY  Dispense: 10 tablet; Refill: 0  2. Facial cellulitis *Cool COmpresses If worsens tonight RTO in AM for recheck - ciprofloxacin (CIPRO) 500 MG tablet; Take 1  tablet (500 mg total) by mouth 2 (two) times daily.  Dispense: 20 tablet; Refill: 0  Mary-Margaret [name], FNP

## 2012-12-21 NOTE — Patient Instructions (Signed)

## 2012-12-31 ENCOUNTER — Other Ambulatory Visit: Payer: Self-pay | Admitting: Nurse Practitioner

## 2013-01-01 ENCOUNTER — Other Ambulatory Visit: Payer: Self-pay | Admitting: Nurse Practitioner

## 2013-01-01 ENCOUNTER — Telehealth: Payer: Self-pay | Admitting: Nurse Practitioner

## 2013-01-01 NOTE — Telephone Encounter (Signed)
Pt aware,no extra pill to be taken.

## 2013-01-03 ENCOUNTER — Other Ambulatory Visit: Payer: Self-pay | Admitting: Nurse Practitioner

## 2013-01-03 NOTE — Telephone Encounter (Signed)
Last seen 12/21/12 MMM  Last filled 12/17/12 #20

## 2013-01-08 ENCOUNTER — Other Ambulatory Visit: Payer: Self-pay | Admitting: Nurse Practitioner

## 2013-01-11 ENCOUNTER — Other Ambulatory Visit: Payer: Self-pay | Admitting: Nurse Practitioner

## 2013-01-14 ENCOUNTER — Other Ambulatory Visit: Payer: Self-pay | Admitting: Nurse Practitioner

## 2013-01-15 ENCOUNTER — Encounter: Payer: Self-pay | Admitting: Family Medicine

## 2013-01-15 ENCOUNTER — Ambulatory Visit (INDEPENDENT_AMBULATORY_CARE_PROVIDER_SITE_OTHER): Payer: Medicare Other | Admitting: Family Medicine

## 2013-01-15 VITALS — BP 138/85 | HR 76 | Temp 98.0°F | Ht 64.0 in | Wt 264.0 lb

## 2013-01-15 DIAGNOSIS — F411 Generalized anxiety disorder: Secondary | ICD-10-CM

## 2013-01-15 DIAGNOSIS — M549 Dorsalgia, unspecified: Secondary | ICD-10-CM

## 2013-01-15 MED ORDER — ALPRAZOLAM 1 MG PO TABS: 1.0000 mg | ORAL_TABLET | Freq: Three times a day (TID) | ORAL | Status: DC | PRN

## 2013-01-15 MED ORDER — CYCLOBENZAPRINE HCL 10 MG PO TABS: 10.0000 mg | ORAL_TABLET | Freq: Three times a day (TID) | ORAL | Status: DC | PRN

## 2013-01-15 NOTE — Patient Instructions (Addendum)
Back Pain, Adult  Low back pain is very common. About 1 in 5 people have back pain. The cause of low back pain is rarely dangerous. The pain often gets better over time. About half of people with a sudden onset of back pain feel better in just 2 weeks. About 8 in 10 people feel better by 6 weeks.   CAUSES  Some common causes of back pain include:  · Strain of the muscles or ligaments supporting the spine.  · Wear and tear (degeneration) of the spinal discs.  · Arthritis.  · Direct injury to the back.  DIAGNOSIS  Most of the time, the direct cause of low back pain is not known. However, back pain can be treated effectively even when the exact cause of the pain is unknown. Answering your caregiver's questions about your overall health and symptoms is one of the most accurate ways to make sure the cause of your pain is not dangerous. If your caregiver needs more information, he or she may order lab work or imaging tests (X-rays or MRIs). However, even if imaging tests show changes in your back, this usually does not require surgery.  HOME CARE INSTRUCTIONS  For many people, back pain returns. Since low back pain is rarely dangerous, it is often a condition that people can learn to manage on their own.   · Remain active. It is stressful on the back to sit or stand in one place. Do not sit, drive, or stand in one place for more than 30 minutes at a time. Take short walks on level surfaces as soon as pain allows. Try to increase the length of time you walk each day.  · Do not stay in bed. Resting more than 1 or 2 days can delay your recovery.  · Do not avoid exercise or work. Your body is made to move. It is not dangerous to be active, even though your back may hurt. Your back will likely heal faster if you return to being active before your pain is gone.  · Pay attention to your body when you  bend and lift. Many people have less discomfort when lifting if they bend their knees, keep the load close to their bodies, and  avoid twisting. Often, the most comfortable positions are those that put less stress on your recovering back.  · Find a comfortable position to sleep. Use a firm mattress and lie on your side with your knees slightly bent. If you lie on your back, put a pillow under your knees.  · Only take over-the-counter or prescription medicines as directed by your caregiver. Over-the-counter medicines to reduce pain and inflammation are often the most helpful. Your caregiver may prescribe muscle relaxant drugs. These medicines help dull your pain so you can more quickly return to your normal activities and healthy exercise.  · Put ice on the injured area.  · Put ice in a plastic bag.  · Place a towel between your skin and the bag.  · Leave the ice on for 15-20 minutes, 3-4 times a day for the first 2 to 3 days. After that, ice and heat may be alternated to reduce pain and spasms.  · Ask your caregiver about trying back exercises and gentle massage. This may be of some benefit.  · Avoid feeling anxious or stressed. Stress increases muscle tension and can worsen back pain. It is important to recognize when you are anxious or stressed and learn ways to manage it. Exercise is a great option.  SEEK MEDICAL CARE IF:  · You have pain that is not relieved with rest or   medicine.  · You have pain that does not improve in 1 week.  · You have new symptoms.  · You are generally not feeling well.  SEEK IMMEDIATE MEDICAL CARE IF:   · You have pain that radiates from your back into your legs.  · You develop new bowel or bladder control problems.  · You have unusual weakness or numbness in your arms or legs.  · You develop nausea or vomiting.  · You develop abdominal pain.  · You feel faint.  Document Released: 05/02/2005 Document Revised: 11/01/2011 Document Reviewed: 09/20/2010  Exit[employer]® Patient Information ©2014 [employer].

## 2013-01-15 NOTE — Progress Notes (Signed)
  Subjective:    Patient ID: Lauren Harmon, female    DOB: 1951/01/14, 62 y.o.   MRN: [id number]  HPI This 62 y.o. female presents for evaluation of needing a refill on her xanax.  She states she called In last week to have it refilled and she didn't get it.  She takes xanax 1mg  po tid.  She has back pain and wants refills on her cyclobenzaprine.     Review of Systems C/o anxiety and back pain. No chest pain, SOB, HA, dizziness, vision change, N/V, diarrhea, constipation, dysuria, urinary urgency or frequency or rash.     Objective:   Physical Exam Vital signs noted  Well developed well nourished female.  HEENT - Head atraumatic Normocephalic                Eyes - PERRLA, Conjuctiva - clear Sclera- Clear EOMI                Ears - EAC's Wnl TM's Wnl Gross Hearing WNL                Nose - Nares patent                 Throat - oropharanx wnl Respiratory - Lungs CTA bilateral Cardiac - RRR S1 and S2 without murmur GI - Abdomen soft Nontender and bowel sounds active x 4 Extremities - No edema. Neuro - Grossly intact.       Assessment & Plan:  Anxiety state, unspecified - Plan: ALPRAZolam (XANAX) 1 MG tablet po tid prn.  Discussed With patient she should take tid prn.  Continue with effexor xr 150mg  po bid.  Back pain - Plan: cyclobenzaprine (FLEXERIL) 10 MG tablet po tid prn.

## 2013-01-17 ENCOUNTER — Other Ambulatory Visit: Payer: Self-pay | Admitting: Family Medicine

## 2013-01-21 ENCOUNTER — Other Ambulatory Visit: Payer: Self-pay | Admitting: Nurse Practitioner

## 2013-01-23 NOTE — Telephone Encounter (Signed)
Last seen 01/15/13  B  [place]   If approved route to nurse to call in

## 2013-01-24 NOTE — Telephone Encounter (Signed)
LAST RF 01/03/13. LAST OV 01/15/13.

## 2013-02-07 ENCOUNTER — Telehealth: Payer: Self-pay | Admitting: Nurse Practitioner

## 2013-02-07 NOTE — Telephone Encounter (Signed)
No she is alredy on maximum dose of xanax- patient need sto see psych to see if they can help her- give her list of psych people she can go to

## 2013-02-08 NOTE — Telephone Encounter (Signed)
Pt has appt on Monday 

## 2013-02-08 NOTE — Telephone Encounter (Signed)
Appt on Monday and she will discuss her anxiety with provider.

## 2013-02-11 ENCOUNTER — Encounter: Payer: Self-pay | Admitting: Nurse Practitioner

## 2013-02-11 ENCOUNTER — Ambulatory Visit (INDEPENDENT_AMBULATORY_CARE_PROVIDER_SITE_OTHER): Payer: Medicare Other | Admitting: Nurse Practitioner

## 2013-02-11 VITALS — BP 184/88 | HR 99 | Temp 96.7°F | Ht 64.0 in | Wt 265.0 lb

## 2013-02-11 DIAGNOSIS — I10 Essential (primary) hypertension: Secondary | ICD-10-CM

## 2013-02-11 DIAGNOSIS — G4700 Insomnia, unspecified: Secondary | ICD-10-CM

## 2013-02-11 DIAGNOSIS — F411 Generalized anxiety disorder: Secondary | ICD-10-CM | POA: Insufficient documentation

## 2013-02-11 DIAGNOSIS — E785 Hyperlipidemia, unspecified: Secondary | ICD-10-CM

## 2013-02-11 DIAGNOSIS — F329 Major depressive disorder, single episode, unspecified: Secondary | ICD-10-CM

## 2013-02-11 MED ORDER — ATORVASTATIN CALCIUM 40 MG PO TABS: 40.0000 mg | ORAL_TABLET | Freq: Every day | ORAL | Status: DC

## 2013-02-11 MED ORDER — AMLODIPINE BESY-BENAZEPRIL HCL 5-20 MG PO CAPS: 1.0000 | ORAL_CAPSULE | Freq: Every day | ORAL | Status: DC

## 2013-02-11 MED ORDER — ZOLPIDEM TARTRATE 5 MG PO TABS: 5.0000 mg | ORAL_TABLET | Freq: Every evening | ORAL | Status: DC | PRN

## 2013-02-11 MED ORDER — FUROSEMIDE 20 MG PO TABS: 20.0000 mg | ORAL_TABLET | Freq: Two times a day (BID) | ORAL | Status: DC

## 2013-02-11 MED ORDER — MONTELUKAST SODIUM 10 MG PO TABS: 10.0000 mg | ORAL_TABLET | Freq: Every day | ORAL | Status: DC

## 2013-02-11 MED ORDER — ESOMEPRAZOLE MAGNESIUM 40 MG PO CPDR: 40.0000 mg | DELAYED_RELEASE_CAPSULE | Freq: Every day | ORAL | Status: DC

## 2013-02-11 MED ORDER — ALPRAZOLAM 1 MG PO TABS: 1.0000 mg | ORAL_TABLET | Freq: Three times a day (TID) | ORAL | Status: DC | PRN

## 2013-02-11 NOTE — Patient Instructions (Addendum)

## 2013-02-11 NOTE — Progress Notes (Signed)
Subjective:    Patient ID: Lauren Harmon, female    DOB: 11-Mar-1951, 62 y.o.   MRN: [id number]  Anxiety Presents for follow-up visit. Symptoms include depressed mood, excessive worry, hyperventilation, insomnia, irritability, nervous/anxious behavior, palpitations, panic and restlessness. Patient reports no nausea, shortness of breath or suicidal ideas. Symptoms occur constantly. The severity of symptoms is moderate. The symptoms are aggravated by family issues and work stress. The quality of sleep is fair. Nighttime awakenings: one to two.   Risk factors include a major life event. Her past medical history is significant for anxiety/panic attacks and depression. Past treatments include benzodiazephines and SSRIs. The treatment provided moderate relief. Compliance with prior treatments has been good.  Hypertension This is a chronic problem. The current episode started more than 1 year ago. The problem has been rapidly worsening since onset. The problem is uncontrolled. Associated symptoms include anxiety, malaise/fatigue, palpitations and peripheral edema. Pertinent negatives include no shortness of breath. Risk factors for coronary artery disease include dyslipidemia, family history, post-menopausal state, obesity, stress and sedentary lifestyle. Past treatments include calcium channel blockers and ACE inhibitors. The current treatment provides moderate improvement. Compliance problems include exercise.  There is no history of a thyroid problem. There is no history of sleep apnea.  Gastrophageal Reflux She reports no coughing, no heartburn, no nausea or no sore throat. This is a chronic problem. The current episode started more than 1 year ago. The problem occurs rarely. The problem has been resolved. The symptoms are aggravated by certain foods. Pertinent negatives include no muscle weakness. Risk factors include lack of exercise. She has tried an antacid and a PPI for the symptoms. The treatment  provided significant relief.  Hyperlipidemia This is a chronic problem. The current episode started more than 1 year ago. The problem is controlled. Recent lipid tests were reviewed and are normal. Exacerbating diseases include obesity. She has no history of diabetes. Factors aggravating her hyperlipidemia include fatty foods. Associated symptoms include leg pain and myalgias. Pertinent negatives include no shortness of breath. Current antihyperlipidemic treatment includes statins. The current treatment provides significant improvement of lipids. Risk factors for coronary artery disease include post-menopausal, a sedentary lifestyle, stress, obesity, hypertension and dyslipidemia.   Depression Pt takes Effexor XR- Helps and works well- but pt has ruin out of Xanax and has increased her anxiety and depression  Knee Pain & Lower back Pain Pt takes Vicodin BID and Flexeril daily-Helps a lot with her pain  Peripheral Edema Pt takes Lasix-Works well with no c/o of adverse reactions  Asthma Pt takes takes singulair which helps-She has a rescue inhaler that she uses about 3 times a month-Only when she is doing strenuous activity   Review of Systems  Constitutional: Positive for malaise/fatigue and irritability.  HENT: Negative for sore throat.   Respiratory: Negative for cough and shortness of breath.   Cardiovascular: Positive for palpitations.  Gastrointestinal: Negative for heartburn and nausea.  Musculoskeletal: Positive for myalgias. Negative for muscle weakness.  Psychiatric/Behavioral: Negative for suicidal ideas. The patient is nervous/anxious and has insomnia.   All other systems reviewed and are negative.       Objective:   Physical Exam  Vitals reviewed. Constitutional: She is oriented to person, place, and time. She appears well-developed and well-nourished.  HENT:  Head: Normocephalic.  Right Ear: External ear normal.  Left Ear: External ear normal.  Nose: Nose normal.   Mouth/Throat: Oropharynx is clear and moist.  Eyes: Pupils are equal, round, and reactive to light.  Neck: Normal range of motion. Neck supple. No thyromegaly present.  Cardiovascular: Normal rate, regular rhythm, normal heart sounds and intact distal pulses.   Pulmonary/Chest: Effort normal and breath sounds normal.  Abdominal: Soft. Bowel sounds are normal. She exhibits no distension. There is no tenderness.  Musculoskeletal: Normal range of motion. She exhibits no edema and no tenderness.  Neurological: She is alert and oriented to person, place, and time.  Skin: Skin is warm and dry.  Psychiatric: She has a normal mood and affect. Her behavior is normal. Judgment and thought content normal.    BP 184/88  Pulse 99  Temp(Src) 96.7 F (35.9 C) (Oral)  Ht 5\' 4"  (1.626 m)  Wt 265 lb (120.203 kg)  BMI 45.46 kg/m2  LMP 08/09/2002       Assessment & Plan:   1. Hypertension   2. Hyperlipidemia   3. Depression   4. GAD (generalized anxiety disorder)   5. Insomnia   6. Anxiety state, unspecified    No orders of the defined types were placed in this encounter.   Meds ordered this encounter  Medications  . esomeprazole (NEXIUM) 40 MG capsule    Sig: Take 1 capsule (40 mg total) by mouth daily before breakfast.    Dispense:  30 capsule    Refill:  5    Order Specific Question:  Supervising Provider    Answer:  Ernestina Penna [[id number]]  . montelukast (SINGULAIR) 10 MG tablet    Sig: Take 1 tablet (10 mg total) by mouth at bedtime.    Dispense:  30 tablet    Refill:  5    Order Specific Question:  Supervising Provider    Answer:  Ernestina Penna [[id number]]  . atorvastatin (LIPITOR) 40 MG tablet    Sig: Take 1 tablet (40 mg total) by mouth daily.    Dispense:  30 tablet    Refill:  5    Order Specific Question:  Supervising Provider    Answer:  Ernestina Penna [[id number]]  . amLODipine-benazepril (LOTREL) 5-20 MG per capsule    Sig: Take 1 capsule by mouth daily.    Dispense:  30  capsule    Refill:  5    Order Specific Question:  Supervising Provider    Answer:  Ernestina Penna [[id number]]  . zolpidem (AMBIEN) 5 MG tablet    Sig: Take 1 tablet (5 mg total) by mouth at bedtime as needed for sleep.    Dispense:  30 tablet    Refill:  1    Order Specific Question:  Supervising Provider    Answer:  Ernestina Penna [[id number]]  . furosemide (LASIX) 20 MG tablet    Sig: Take 1 tablet (20 mg total) by mouth 2 (two) times daily.    Dispense:  30 tablet    Refill:  5    Order Specific Question:  Supervising Provider    Answer:  Ernestina Penna [[id number]]  . ALPRAZolam (XANAX) 1 MG tablet    Sig: Take 1 tablet (1 mg total) by mouth 3 (three) times daily as needed for sleep.    Dispense:  90 tablet    Refill:  1    Order Specific Question:  Supervising Provider    Answer:  Ernestina Penna [[id number]]   Patient told not to take more than 3 xanax in a day- encouraged to get counseling but patient refuses Stress management Continue all meds Labs pending Diet and exercise encouraged Health maintenance reviewed  Follow up in 3 month  Mary-Margaret [name], FNP

## 2013-02-13 LAB — CMP14+EGFR
ALT: 18 IU/L (ref 0–32)
AST: 16 IU/L (ref 0–40)
Albumin/Globulin Ratio: 2 (ref 1.1–2.5)
Alkaline Phosphatase: 56 IU/L (ref 39–117)
BUN/Creatinine Ratio: 8 — ABNORMAL LOW (ref 11–26)
CO2: 25 mmol/L (ref 18–29)
Chloride: 93 mmol/L — ABNORMAL LOW (ref 97–108)
Creatinine, Ser: 0.85 mg/dL (ref 0.57–1.00)
Globulin, Total: 2.1 g/dL (ref 1.5–4.5)
Potassium: 4.2 mmol/L (ref 3.5–5.2)
Sodium: 133 mmol/L — ABNORMAL LOW (ref 134–144)
Total Protein: 6.3 g/dL (ref 6.0–8.5)

## 2013-02-13 LAB — NMR, LIPOPROFILE
HDL Cholesterol by NMR: 78 mg/dL (ref >=40)
HDL Particle Number: 47.4 umol/L (ref >=30.5)
LDL Particle Number: 475 nmol/L (ref <1000)
LDLC SERPL CALC-MCNC: 54 mg/dL (ref <100)
LP-IR Score: <25 (ref <=45)
Small LDL Particle Number: <90 nmol/L (ref <=527)

## 2013-02-19 ENCOUNTER — Other Ambulatory Visit: Payer: Self-pay | Admitting: Family Medicine

## 2013-02-27 ENCOUNTER — Other Ambulatory Visit: Payer: Self-pay | Admitting: Family Medicine

## 2013-02-28 ENCOUNTER — Other Ambulatory Visit (HOSPITAL_COMMUNITY): Payer: Self-pay | Admitting: Orthopaedic Surgery

## 2013-02-28 ENCOUNTER — Ambulatory Visit (HOSPITAL_COMMUNITY)
Admission: RE | Admit: 2013-02-28 | Discharge: 2013-02-28 | Disposition: A | Discharge status: Home / Self Care | Payer: PRIVATE HEALTH INSURANCE | Source: Ambulatory Visit | Attending: Orthopaedic Surgery | Admitting: Orthopaedic Surgery

## 2013-02-28 DIAGNOSIS — R52 Pain, unspecified: Secondary | ICD-10-CM

## 2013-02-28 DIAGNOSIS — M1710 Unilateral primary osteoarthritis, unspecified knee: Secondary | ICD-10-CM | POA: Insufficient documentation

## 2013-02-28 DIAGNOSIS — M659 Unspecified synovitis and tenosynovitis, unspecified site: Secondary | ICD-10-CM | POA: Insufficient documentation

## 2013-02-28 DIAGNOSIS — IMO0002 Reserved for concepts with insufficient information to code with codable children: Secondary | ICD-10-CM | POA: Insufficient documentation

## 2013-02-28 DIAGNOSIS — M25469 Effusion, unspecified knee: Secondary | ICD-10-CM | POA: Insufficient documentation

## 2013-02-28 DIAGNOSIS — M23305 Other meniscus derangements, unspecified medial meniscus, unspecified knee: Secondary | ICD-10-CM | POA: Insufficient documentation

## 2013-02-28 DIAGNOSIS — M25569 Pain in unspecified knee: Secondary | ICD-10-CM | POA: Insufficient documentation

## 2013-03-08 ENCOUNTER — Other Ambulatory Visit: Payer: Self-pay | Admitting: Nurse Practitioner

## 2013-03-11 ENCOUNTER — Other Ambulatory Visit: Payer: Self-pay | Admitting: Nurse Practitioner

## 2013-03-11 ENCOUNTER — Telehealth: Payer: Self-pay | Admitting: Nurse Practitioner

## 2013-03-11 MED ORDER — CONJ ESTROG-MEDROXYPROGEST ACE 0.3-1.5 MG PO TABS: 1.0000 | ORAL_TABLET | Freq: Every day | ORAL | Status: DC

## 2013-03-11 MED ORDER — VENLAFAXINE HCL ER 150 MG PO CP24: 150.0000 mg | ORAL_CAPSULE | Freq: Two times a day (BID) | ORAL | Status: DC

## 2013-03-11 NOTE — Telephone Encounter (Signed)
rx sent to pharmacy

## 2013-03-12 NOTE — Telephone Encounter (Signed)
Patient notified that rx sent to pharmacy 

## 2013-03-18 ENCOUNTER — Other Ambulatory Visit: Payer: Self-pay | Admitting: Nurse Practitioner

## 2013-03-20 ENCOUNTER — Telehealth: Payer: Self-pay | Admitting: Nurse Practitioner

## 2013-03-20 ENCOUNTER — Other Ambulatory Visit: Payer: Self-pay | Admitting: Nurse Practitioner

## 2013-03-20 NOTE — Telephone Encounter (Signed)
Last seen 02/11/13  MMM If approved route to nurse to phone into [facility]

## 2013-03-21 NOTE — Telephone Encounter (Signed)
Pt received letter from [facility]. Pt will bring letter to office for MMM to review about prempro.

## 2013-04-02 ENCOUNTER — Other Ambulatory Visit: Payer: Self-pay | Admitting: Family Medicine

## 2013-04-17 ENCOUNTER — Other Ambulatory Visit: Payer: Self-pay | Admitting: Nurse Practitioner

## 2013-05-05 ENCOUNTER — Other Ambulatory Visit: Payer: Self-pay | Admitting: Family Medicine

## 2013-05-06 ENCOUNTER — Telehealth: Payer: Self-pay | Admitting: Nurse Practitioner

## 2013-05-06 DIAGNOSIS — F411 Generalized anxiety disorder: Secondary | ICD-10-CM

## 2013-05-06 MED ORDER — ALPRAZOLAM 1 MG PO TABS: 1.0000 mg | ORAL_TABLET | Freq: Three times a day (TID) | ORAL | Status: DC | PRN

## 2013-05-06 NOTE — Telephone Encounter (Signed)
Please call in xanax 1mg  TID #90 0 refills

## 2013-05-06 NOTE — Telephone Encounter (Signed)
Called in.

## 2013-05-13 ENCOUNTER — Other Ambulatory Visit: Payer: Self-pay | Admitting: Nurse Practitioner

## 2013-05-15 NOTE — Telephone Encounter (Signed)
Please advise 

## 2013-05-22 ENCOUNTER — Telehealth: Payer: Self-pay | Admitting: Nurse Practitioner

## 2013-05-22 NOTE — Telephone Encounter (Signed)
FYI

## 2013-05-22 NOTE — Telephone Encounter (Signed)
That ortho is going to follow up with her knee

## 2013-05-22 NOTE — Telephone Encounter (Signed)
Ortho is going to follow up with her right knee

## 2013-05-22 NOTE — Telephone Encounter (Signed)
Please find out what patient wants to tell me please

## 2013-05-24 ENCOUNTER — Ambulatory Visit: Payer: Medicare Other | Admitting: Nurse Practitioner

## 2013-06-02 ENCOUNTER — Other Ambulatory Visit: Payer: Self-pay | Admitting: Nurse Practitioner

## 2013-06-04 ENCOUNTER — Other Ambulatory Visit: Payer: Self-pay

## 2013-06-04 ENCOUNTER — Telehealth: Payer: Self-pay | Admitting: Nurse Practitioner

## 2013-06-04 DIAGNOSIS — F411 Generalized anxiety disorder: Secondary | ICD-10-CM

## 2013-06-04 MED ORDER — VENLAFAXINE HCL ER 150 MG PO CP24: 150.0000 mg | ORAL_CAPSULE | Freq: Two times a day (BID) | ORAL | Status: DC

## 2013-06-04 MED ORDER — MELOXICAM 15 MG PO TABS: 15.0000 mg | ORAL_TABLET | Freq: Every day | ORAL | Status: DC

## 2013-06-05 MED ORDER — MELOXICAM 15 MG PO TABS: 15.0000 mg | ORAL_TABLET | Freq: Every day | ORAL | Status: DC

## 2013-06-05 MED ORDER — ALPRAZOLAM 1 MG PO TABS: 1.0000 mg | ORAL_TABLET | Freq: Three times a day (TID) | ORAL | Status: DC | PRN

## 2013-06-05 MED ORDER — VENLAFAXINE HCL ER 150 MG PO CP24: 150.0000 mg | ORAL_CAPSULE | Freq: Two times a day (BID) | ORAL | Status: DC

## 2013-06-05 NOTE — Telephone Encounter (Signed)
Please call in alprazolam 1mg  TID #90 0 refills

## 2013-06-05 NOTE — Telephone Encounter (Signed)
[facility]ED [facility] 

## 2013-06-11 ENCOUNTER — Other Ambulatory Visit: Payer: Self-pay | Admitting: Nurse Practitioner

## 2013-06-13 ENCOUNTER — Telehealth: Payer: Self-pay | Admitting: Nurse Practitioner

## 2013-06-13 NOTE — Telephone Encounter (Signed)
Patient last seen in office on 9-29. Rx last filled on 8-4 for #20. Please advise

## 2013-06-13 NOTE — Telephone Encounter (Signed)
Please review

## 2013-06-14 ENCOUNTER — Ambulatory Visit: Payer: Medicare Other | Admitting: Nurse Practitioner

## 2013-06-18 ENCOUNTER — Other Ambulatory Visit: Payer: Self-pay | Admitting: Family Medicine

## 2013-06-28 ENCOUNTER — Telehealth: Payer: Self-pay | Admitting: Nurse Practitioner

## 2013-06-28 NOTE — Telephone Encounter (Signed)
Patient will call back to schedule as soon as she hears from the Dr. At [facility] to see when her surgery is scheduled

## 2013-07-01 ENCOUNTER — Ambulatory Visit: Payer: Medicare Other | Admitting: Nurse Practitioner

## 2013-07-02 ENCOUNTER — Other Ambulatory Visit: Payer: Self-pay | Admitting: Nurse Practitioner

## 2013-07-04 NOTE — Telephone Encounter (Signed)
Please call in xanax with 1 refills 

## 2013-07-04 NOTE — Telephone Encounter (Signed)
Called in.

## 2013-07-11 ENCOUNTER — Other Ambulatory Visit: Payer: Self-pay | Admitting: Nurse Practitioner

## 2013-08-21 ENCOUNTER — Telehealth: Payer: Self-pay | Admitting: Nurse Practitioner

## 2013-08-21 NOTE — Telephone Encounter (Signed)
Patient obviously has no idea what she is talking about. She will call back after her appointment with a different doctor when she knows more about her condition.

## 2013-09-10 ENCOUNTER — Other Ambulatory Visit: Payer: Self-pay

## 2013-09-10 MED ORDER — PROMETHAZINE HCL 12.5 MG PO TABS: 12.5000 mg | ORAL_TABLET | Freq: Four times a day (QID) | ORAL | Status: DC | PRN

## 2013-09-10 NOTE — Telephone Encounter (Signed)
Last seen 02/11/13  MMM if approved route to nurse [facility]

## 2013-09-13 ENCOUNTER — Telehealth: Payer: Self-pay | Admitting: Family Medicine

## 2013-09-13 ENCOUNTER — Telehealth: Payer: Self-pay | Admitting: Nurse Practitioner

## 2013-09-14 MED ORDER — PROMETHAZINE HCL 12.5 MG PO TABS: 12.5000 mg | ORAL_TABLET | Freq: Four times a day (QID) | ORAL | Status: DC | PRN

## 2013-09-14 NOTE — Telephone Encounter (Signed)
phernergan rx sent to pharmacy

## 2013-09-16 ENCOUNTER — Telehealth: Payer: Self-pay | Admitting: Nurse Practitioner

## 2013-09-16 NOTE — Telephone Encounter (Signed)
Called in see notes

## 2013-09-26 ENCOUNTER — Other Ambulatory Visit: Payer: Self-pay

## 2013-09-26 MED ORDER — ALPRAZOLAM 1 MG PO TABS: ORAL_TABLET | ORAL | Status: DC

## 2013-09-26 NOTE — Telephone Encounter (Signed)
Please call in xanax with 1 refills 

## 2013-09-26 NOTE — Telephone Encounter (Signed)
Last seen 02/11/13  MMM if approved route to nurse [facility]

## 2013-09-26 NOTE — Telephone Encounter (Signed)
Patient NTBS for follow up and lab work  

## 2013-09-27 ENCOUNTER — Telehealth: Payer: Self-pay | Admitting: Nurse Practitioner

## 2013-09-27 NOTE — Telephone Encounter (Signed)
This has already been taken care of [facility]

## 2013-09-27 NOTE — Telephone Encounter (Signed)
Called in.

## 2013-09-30 ENCOUNTER — Encounter: Payer: Self-pay | Admitting: Nurse Practitioner

## 2013-09-30 ENCOUNTER — Ambulatory Visit (INDEPENDENT_AMBULATORY_CARE_PROVIDER_SITE_OTHER): Payer: Medicare Other | Admitting: Nurse Practitioner

## 2013-09-30 VITALS — BP 132/89 | HR 95 | Temp 97.9°F | Ht 66.0 in

## 2013-09-30 DIAGNOSIS — M1990 Unspecified osteoarthritis, unspecified site: Secondary | ICD-10-CM

## 2013-09-30 DIAGNOSIS — F329 Major depressive disorder, single episode, unspecified: Secondary | ICD-10-CM

## 2013-09-30 DIAGNOSIS — M8580 Other specified disorders of bone density and structure, unspecified site: Secondary | ICD-10-CM

## 2013-09-30 DIAGNOSIS — M899 Disorder of bone, unspecified: Secondary | ICD-10-CM

## 2013-09-30 DIAGNOSIS — F3289 Other specified depressive episodes: Secondary | ICD-10-CM

## 2013-09-30 DIAGNOSIS — F411 Generalized anxiety disorder: Secondary | ICD-10-CM

## 2013-09-30 DIAGNOSIS — E785 Hyperlipidemia, unspecified: Secondary | ICD-10-CM

## 2013-09-30 DIAGNOSIS — J45909 Unspecified asthma, uncomplicated: Secondary | ICD-10-CM

## 2013-09-30 DIAGNOSIS — F32A Depression, unspecified: Secondary | ICD-10-CM

## 2013-09-30 DIAGNOSIS — E669 Obesity, unspecified: Secondary | ICD-10-CM

## 2013-09-30 DIAGNOSIS — M949 Disorder of cartilage, unspecified: Secondary | ICD-10-CM

## 2013-09-30 DIAGNOSIS — I10 Essential (primary) hypertension: Secondary | ICD-10-CM

## 2013-09-30 NOTE — Patient Instructions (Signed)

## 2013-09-30 NOTE — Progress Notes (Signed)
Subjective:    Patient ID: Lauren Harmon, female    DOB: 1951/03/24, 63 y.o.   MRN: [id number]  Anxiety Presents for follow-up visit. Symptoms include depressed mood, excessive worry, hyperventilation, insomnia, irritability, nervous/anxious behavior, palpitations, panic and restlessness. Patient reports no nausea, shortness of breath or suicidal ideas. Symptoms occur constantly. The severity of symptoms is moderate. The symptoms are aggravated by family issues and work stress. The quality of sleep is fair. Nighttime awakenings: one to two.   Risk factors include a major life event. Her past medical history is significant for anxiety/panic attacks and depression. Past treatments include benzodiazephines and SSRIs. The treatment provided moderate relief. Compliance with prior treatments has been good.  Hypertension This is a chronic problem. The current episode started more than 1 year ago. The problem has been rapidly worsening since onset. The problem is uncontrolled. Associated symptoms include anxiety, malaise/fatigue, palpitations and peripheral edema. Pertinent negatives include no shortness of breath. Risk factors for coronary artery disease include dyslipidemia, family history, post-menopausal state, obesity, stress and sedentary lifestyle. Past treatments include calcium channel blockers and ACE inhibitors. The current treatment provides moderate improvement. Compliance problems include exercise.  There is no history of a thyroid problem. There is no history of sleep apnea.  Gastrophageal Reflux She reports no coughing, no heartburn, no nausea or no sore throat. This is a chronic problem. The current episode started more than 1 year ago. The problem occurs rarely. The problem has been resolved. The symptoms are aggravated by certain foods. Pertinent negatives include no muscle weakness. Risk factors include lack of exercise. She has tried an antacid and a PPI for the symptoms. The treatment  provided significant relief.  Hyperlipidemia This is a chronic problem. The current episode started more than 1 year ago. The problem is controlled. Recent lipid tests were reviewed and are normal. Exacerbating diseases include obesity. She has no history of diabetes. Factors aggravating her hyperlipidemia include fatty foods. Associated symptoms include leg pain and myalgias. Pertinent negatives include no shortness of breath. Current antihyperlipidemic treatment includes statins. The current treatment provides significant improvement of lipids. Risk factors for coronary artery disease include post-menopausal, a sedentary lifestyle, stress, obesity, hypertension and dyslipidemia.  Depression Pt takes Effexor XR- Helps and works well- but pt has ruin out of Xanax and has increased her anxiety and depression Peripheral Edema Pt takes Lasix-Works well with no c/o of adverse reactions Asthma Pt takes takes singulair which helps-She has a rescue inhaler that she uses about 3 times a month-Only when she is doing strenuous activity   * Patient has been in rehab for surgery that sh ehad on her right knee from old fracture that she had to have repaired.  Review of Systems  Constitutional: Positive for malaise/fatigue and irritability.  HENT: Negative for sore throat.   Respiratory: Negative for cough and shortness of breath.   Cardiovascular: Positive for palpitations.  Gastrointestinal: Negative for heartburn and nausea.  Musculoskeletal: Positive for myalgias. Negative for muscle weakness.  Psychiatric/Behavioral: Negative for suicidal ideas. The patient is nervous/anxious and has insomnia.   All other systems reviewed and are negative.      Objective:   Physical Exam  Vitals reviewed. Constitutional: She is oriented to person, place, and time. She appears well-developed and well-nourished.  HENT:  Head: Normocephalic.  Right Ear: External ear normal.  Left Ear: External ear normal.  Nose:  Nose normal.  Mouth/Throat: Oropharynx is clear and moist.  Eyes: Pupils are equal, round, and reactive  to light.  Neck: Normal range of motion. Neck supple. No thyromegaly present.  Cardiovascular: Normal rate, regular rhythm, normal heart sounds and intact distal pulses.   Pulmonary/Chest: Effort normal and breath sounds normal.  Abdominal: Soft. Bowel sounds are normal. She exhibits no distension. There is no tenderness.  Musculoskeletal: Normal range of motion. She exhibits no edema and no tenderness.  Neurological: She is alert and oriented to person, place, and time.  Skin: Skin is warm and dry.  Nice healing wound to right knee- no sign of infection   Psychiatric: She has a normal mood and affect. Her behavior is normal. Judgment and thought content normal.    BP 132/89  Pulse 95  Temp(Src) 97.9 F (36.6 C) (Oral)  LMP 08/09/2002       Assessment & Plan:   1. Osteopenia   2. Osteoarthritis   3. Obesity   4. Hypertension   5. Hyperlipidemia   6. GAD (generalized anxiety disorder)   7. Depression   8. Asthma    Orders Placed This Encounter  Procedures  . CMP14+EGFR  . NMR, lipoprofile   Keep follow up appointment with surgeon Labs pending Health maintenance reviewed Diet and exercise encouraged Continue all meds Follow up  In 3 month  [place], FNP

## 2013-10-01 LAB — CMP14+EGFR
ALT: 17 IU/L (ref 0–32)
AST: 20 IU/L (ref 0–40)
Albumin/Globulin Ratio: 1.6 (ref 1.1–2.5)
Albumin: 3.8 g/dL (ref 3.6–4.8)
Alkaline Phosphatase: 62 IU/L (ref 39–117)
BUN/Creatinine Ratio: 11 (ref 11–26)
BUN: 10 mg/dL (ref 8–27)
CALCIUM: 9.2 mg/dL (ref 8.7–10.3)
CO2: 25 mmol/L (ref 18–29)
Chloride: 100 mmol/L (ref 97–108)
Creatinine, Ser: 0.87 mg/dL (ref 0.57–1.00)
GFR calc Af Amer: 83 mL/min/{1.73_m2} (ref >59)
GFR calc non Af Amer: 72 mL/min/{1.73_m2} (ref >59)
Globulin, Total: 2.4 g/dL (ref 1.5–4.5)
Glucose: 87 mg/dL (ref 65–99)
Potassium: 4.8 mmol/L (ref 3.5–5.2)
Sodium: 139 mmol/L (ref 134–144)
Total Bilirubin: <0.2 mg/dL (ref 0.0–1.2)
Total Protein: 6.2 g/dL (ref 6.0–8.5)

## 2013-10-01 LAB — NMR, LIPOPROFILE
Cholesterol: 148 mg/dL (ref 100–199)
HDL Cholesterol by NMR: 78 mg/dL (ref >39)
HDL PARTICLE NUMBER: 49.9 umol/L (ref >=30.5)
LDL Particle Number: 639 nmol/L (ref <1000)
LDL Size: 19.7 nm (ref >20.5)
LDLC SERPL CALC-MCNC: 57 mg/dL (ref 0–99)
LP-IR Score: 47 — ABNORMAL HIGH (ref <=45)
Small LDL Particle Number: 180 nmol/L (ref <=527)
TRIGLYCERIDES BY NMR: 66 mg/dL (ref 0–149)

## 2013-10-08 ENCOUNTER — Other Ambulatory Visit: Payer: Self-pay

## 2013-10-08 MED ORDER — ATORVASTATIN CALCIUM 40 MG PO TABS: 40.0000 mg | ORAL_TABLET | Freq: Every day | ORAL | Status: DC

## 2013-10-08 MED ORDER — MELOXICAM 15 MG PO TABS: 15.0000 mg | ORAL_TABLET | Freq: Every day | ORAL | Status: DC

## 2013-10-14 ENCOUNTER — Other Ambulatory Visit: Payer: Self-pay | Admitting: Nurse Practitioner

## 2013-10-22 ENCOUNTER — Other Ambulatory Visit: Payer: Self-pay | Admitting: Nurse Practitioner

## 2013-10-23 NOTE — Telephone Encounter (Signed)
Patient last seen in office on 09-30-13. Rx last filled on 5-15 for #90. Please advise. If approved please route to pool B so nurse can phone in to pharmacy

## 2013-10-25 ENCOUNTER — Other Ambulatory Visit: Payer: Self-pay | Admitting: Nurse Practitioner

## 2013-10-25 NOTE — Telephone Encounter (Signed)
Please call in xanax with 1 refills- DO N OT FILL TILL 6/15.15

## 2013-10-25 NOTE — Telephone Encounter (Signed)
Called in [facility]

## 2013-10-31 ENCOUNTER — Other Ambulatory Visit: Payer: Self-pay | Admitting: Nurse Practitioner

## 2013-10-31 ENCOUNTER — Ambulatory Visit (INDEPENDENT_AMBULATORY_CARE_PROVIDER_SITE_OTHER): Payer: Medicare Other | Admitting: Nurse Practitioner

## 2013-10-31 ENCOUNTER — Other Ambulatory Visit: Payer: Self-pay | Admitting: *Deleted

## 2013-10-31 ENCOUNTER — Encounter: Payer: Self-pay | Admitting: Nurse Practitioner

## 2013-10-31 VITALS — BP 140/84 | HR 88 | Temp 98.1°F

## 2013-10-31 DIAGNOSIS — M25569 Pain in unspecified knee: Secondary | ICD-10-CM

## 2013-10-31 DIAGNOSIS — Z9889 Other specified postprocedural states: Secondary | ICD-10-CM

## 2013-10-31 DIAGNOSIS — M25561 Pain in right knee: Secondary | ICD-10-CM

## 2013-10-31 MED ORDER — VENLAFAXINE HCL ER 150 MG PO CP24: 150.0000 mg | ORAL_CAPSULE | Freq: Two times a day (BID) | ORAL | Status: DC

## 2013-10-31 MED ORDER — HYDROCODONE-ACETAMINOPHEN 7.5-750 MG PO TABS: 1.0000 | ORAL_TABLET | Freq: Three times a day (TID) | ORAL | Status: DC | PRN

## 2013-10-31 MED ORDER — AMLODIPINE BESY-BENAZEPRIL HCL 5-20 MG PO CAPS: 1.0000 | ORAL_CAPSULE | Freq: Every day | ORAL | Status: DC

## 2013-10-31 MED ORDER — MONTELUKAST SODIUM 10 MG PO TABS
10.0000 mg | ORAL_TABLET | Freq: Every day | ORAL | Status: DC
Start: 2013-10-31 — End: 2014-04-30

## 2013-10-31 MED ORDER — HYDROCODONE-ACETAMINOPHEN 10-325 MG PO TABS: 1.0000 | ORAL_TABLET | Freq: Four times a day (QID) | ORAL | Status: DC | PRN

## 2013-10-31 NOTE — Patient Instructions (Signed)

## 2013-10-31 NOTE — Progress Notes (Signed)
   Subjective:    Patient ID: Lauren Harmon, female    DOB: 08/31/1950, 63 y.o.   MRN: [id number]  HPI  Patient here today for new prescription. She had surgery done 3 months ago her right [facility] by Dr. Duffy RhodyStanley.   She is seeing an [place]opedic Doctor in [place] Dr. Raymondo BandKeilley. She is taking Norco on as need basis. SHe still says that her leg is hurting. SH e said she was told that bones are not healing well- Has a stimulator that she has to wear 20 minutes a day to encourage bone growth. She is basically just here to get pain meds.   Review of Systems  Constitutional: Negative.   HENT: Negative.   Eyes: Negative.   Respiratory: Negative.   Cardiovascular: Negative.   Gastrointestinal: Negative.   Endocrine: Negative.   Genitourinary: Negative.   Musculoskeletal: Positive for gait problem.       Right Leg Surgery. Non-weight bearing.   Allergic/Immunologic: Negative.   Neurological: Negative.   Hematological: Negative.        Objective:   Physical Exam  Constitutional: She is oriented to person, place, and time. She appears well-developed and well-nourished.  HENT:  Head: Normocephalic.  Eyes: Pupils are equal, round, and reactive to light.  Neck: Normal range of motion.  Cardiovascular: Normal rate and regular rhythm.   Pulmonary/Chest: Effort normal.  Musculoskeletal: She exhibits tenderness.  Patient unable t obear weight on right leg- no edema Pain with any movement.  Neurological: She is alert and oriented to person, place, and time.  Skin: Skin is warm and dry.  Psychiatric: She has a normal mood and affect. Her behavior is normal. Judgment and thought content normal.     BP 140/84  Pulse 88  Temp(Src) 98.1 F (36.7 C) (Oral)  LMP 08/09/2002      Assessment & Plan:   1. Right knee pain   2. Status post knee surgery    Meds ordered this encounter  Medications  . HYDROcodone-acetaminophen (VICODIN ES) 7.5-750 MG per tablet    Sig: Take 1 tablet by  mouth every 8 (eight) hours as needed.    Dispense:  60 tablet    Refill:  0    Order Specific Question:  Supervising Provider    Answer:  Ernestina PennaMOORE, DONALD W [[id number]]   Discuss if patient needs continued to take ain med then will have to be referred to pain management. Dr. Duffy RhodyStanley asked if we would fill her pain meds.  Lauren Daphine DeutscherMartin, FNP

## 2013-10-31 NOTE — Addendum Note (Signed)
Addended by: Azalee CourseFULP, ASHLEY on: 10/31/2013 02:35 PM   Modules accepted: Orders

## 2013-11-01 ENCOUNTER — Other Ambulatory Visit: Payer: Self-pay | Admitting: *Deleted

## 2013-11-01 ENCOUNTER — Other Ambulatory Visit: Payer: Self-pay | Admitting: Nurse Practitioner

## 2013-11-01 MED ORDER — ESOMEPRAZOLE MAGNESIUM 40 MG PO CPDR: 40.0000 mg | DELAYED_RELEASE_CAPSULE | Freq: Every day | ORAL | Status: DC

## 2013-11-13 ENCOUNTER — Other Ambulatory Visit: Payer: Self-pay | Admitting: Nurse Practitioner

## 2013-11-14 NOTE — Telephone Encounter (Signed)
Last seen 10/31/13  MMM

## 2013-11-25 ENCOUNTER — Other Ambulatory Visit: Payer: Self-pay | Admitting: Nurse Practitioner

## 2013-11-25 NOTE — Telephone Encounter (Signed)
Please call in xanax with 1 refills 

## 2013-11-25 NOTE — Telephone Encounter (Signed)
Patient last seen in office on 10-31-13. Rx last filled on 6-15 for #90. Please advise. If approved [facility] so nurse can phone in to pharmacy

## 2013-11-26 ENCOUNTER — Telehealth: Payer: Self-pay | Admitting: Nurse Practitioner

## 2013-11-26 ENCOUNTER — Other Ambulatory Visit: Payer: Self-pay | Admitting: Nurse Practitioner

## 2013-11-26 NOTE — Telephone Encounter (Signed)
Was done yesterday

## 2013-11-26 NOTE — Telephone Encounter (Signed)
Called into pharmacy and left on voicemail 

## 2013-11-26 NOTE — Telephone Encounter (Signed)
[facility] 

## 2013-12-12 ENCOUNTER — Other Ambulatory Visit: Payer: Self-pay | Admitting: Nurse Practitioner

## 2013-12-25 ENCOUNTER — Other Ambulatory Visit: Payer: Self-pay | Admitting: Nurse Practitioner

## 2013-12-25 NOTE — Telephone Encounter (Signed)
Called into pharmacy

## 2013-12-25 NOTE — Telephone Encounter (Signed)
Please call in xanax with 1 refills 

## 2013-12-25 NOTE — Telephone Encounter (Signed)
Last ov 6/15. Last refill 11/26/13. [facility]

## 2014-01-01 ENCOUNTER — Ambulatory Visit (INDEPENDENT_AMBULATORY_CARE_PROVIDER_SITE_OTHER): Payer: Medicare Other | Admitting: Nurse Practitioner

## 2014-01-01 ENCOUNTER — Encounter: Payer: Self-pay | Admitting: Nurse Practitioner

## 2014-01-01 VITALS — BP 145/88 | HR 79 | Temp 97.7°F

## 2014-01-01 DIAGNOSIS — M8580 Other specified disorders of bone density and structure, unspecified site: Secondary | ICD-10-CM

## 2014-01-01 DIAGNOSIS — M949 Disorder of cartilage, unspecified: Secondary | ICD-10-CM

## 2014-01-01 DIAGNOSIS — F411 Generalized anxiety disorder: Secondary | ICD-10-CM

## 2014-01-01 DIAGNOSIS — F32A Depression, unspecified: Secondary | ICD-10-CM

## 2014-01-01 DIAGNOSIS — F3289 Other specified depressive episodes: Secondary | ICD-10-CM

## 2014-01-01 DIAGNOSIS — E669 Obesity, unspecified: Secondary | ICD-10-CM

## 2014-01-01 DIAGNOSIS — M519 Unspecified thoracic, thoracolumbar and lumbosacral intervertebral disc disorder: Secondary | ICD-10-CM

## 2014-01-01 DIAGNOSIS — E785 Hyperlipidemia, unspecified: Secondary | ICD-10-CM

## 2014-01-01 DIAGNOSIS — R609 Edema, unspecified: Secondary | ICD-10-CM

## 2014-01-01 DIAGNOSIS — I10 Essential (primary) hypertension: Secondary | ICD-10-CM

## 2014-01-01 DIAGNOSIS — M899 Disorder of bone, unspecified: Secondary | ICD-10-CM

## 2014-01-01 DIAGNOSIS — R635 Abnormal weight gain: Secondary | ICD-10-CM

## 2014-01-01 DIAGNOSIS — F329 Major depressive disorder, single episode, unspecified: Secondary | ICD-10-CM

## 2014-01-01 MED ORDER — MELOXICAM 15 MG PO TABS: 15.0000 mg | ORAL_TABLET | Freq: Every day | ORAL | Status: DC

## 2014-01-01 MED ORDER — FUROSEMIDE 20 MG PO TABS: 20.0000 mg | ORAL_TABLET | Freq: Two times a day (BID) | ORAL | Status: DC

## 2014-01-01 NOTE — Patient Instructions (Signed)

## 2014-01-01 NOTE — Progress Notes (Signed)
Subjective:    Patient ID: Lauren Harmon, female    DOB: 09/15/1950, 63 y.o.   MRN: [id number]  Anxiety Presents for follow-up visit. Symptoms include depressed mood, excessive worry, hyperventilation, insomnia, irritability, nervous/anxious behavior, palpitations, panic and restlessness. Patient reports no chest pain, nausea, shortness of breath or suicidal ideas. Primary symptoms comment: some days she feels good and otherddays she is depression.. Symptoms occur constantly. The severity of symptoms is moderate. The symptoms are aggravated by family issues and work stress. The quality of sleep is fair. Nighttime awakenings: one to two.   Risk factors include a major life event. Her past medical history is significant for anxiety/panic attacks and depression. Past treatments include benzodiazephines and SSRIs. The treatment provided moderate relief. Compliance with prior treatments has been good.  Hypertension This is a chronic problem. The current episode started more than 1 year ago. The problem has been rapidly worsening since onset. The problem is uncontrolled. Associated symptoms include anxiety, malaise/fatigue, palpitations and peripheral edema. Pertinent negatives include no chest pain or shortness of breath. Risk factors for coronary artery disease include dyslipidemia, family history, post-menopausal state, obesity, stress and sedentary lifestyle. Past treatments include calcium channel blockers and ACE inhibitors. The current treatment provides moderate improvement. Compliance problems include exercise.  There is no history of a thyroid problem. There is no history of sleep apnea.  Gastrophageal Reflux She reports no chest pain, no coughing, no heartburn, no nausea or no sore throat. some days she feels good and otherddays she is depression.. This is a chronic problem. The current episode started more than 1 year ago. The problem occurs rarely. The problem has been resolved. The symptoms are  aggravated by certain foods. Pertinent negatives include no muscle weakness. Risk factors include lack of exercise. She has tried an antacid and a PPI for the symptoms. The treatment provided significant relief.  Hyperlipidemia This is a chronic problem. The current episode started more than 1 year ago. The problem is controlled. Recent lipid tests were reviewed and are normal. Exacerbating diseases include obesity. She has no history of diabetes. Factors aggravating her hyperlipidemia include fatty foods. Associated symptoms include leg pain and myalgias. Pertinent negatives include no chest pain or shortness of breath. Current antihyperlipidemic treatment includes statins. The current treatment provides significant improvement of lipids. Risk factors for coronary artery disease include post-menopausal, a sedentary lifestyle, stress, obesity, hypertension and dyslipidemia.  Depression Pt takes Effexor XR- Helps and works well- but pt has ruin out of Xanax and has increased her anxiety and depression Peripheral Edema Pt takes Lasix-Works well with no c/o of adverse reactions Asthma Pt takes takes singulair which helps-She has a rescue inhaler that she uses about 3 times a month-Only when she is doing strenuous activity   Patient is still in wheel chair and is unable to bear weight on her right knee. SHe is still going to [facility] for follow ups.  *Review of Systems  Constitutional: Positive for malaise/fatigue and irritability.  HENT: Negative for sore throat.   Respiratory: Negative for cough and shortness of breath.   Cardiovascular: Positive for palpitations. Negative for chest pain.  Gastrointestinal: Negative for heartburn and nausea.  Musculoskeletal: Positive for myalgias. Negative for muscle weakness.  Psychiatric/Behavioral: Negative for suicidal ideas. The patient is nervous/anxious and has insomnia.   All other systems reviewed and are negative.      Objective:   Physical Exam   Vitals reviewed. Constitutional: She is oriented to person, place, and time. She appears  well-developed and well-nourished.  HENT:  Head: Normocephalic.  Right Ear: External ear normal.  Left Ear: External ear normal.  Nose: Nose normal.  Mouth/Throat: Oropharynx is clear and moist.  Eyes: Pupils are equal, round, and reactive to light.  Neck: Normal range of motion. Neck supple. No thyromegaly present.  Cardiovascular: Normal rate, regular rhythm, normal heart sounds and intact distal pulses.   Pulmonary/Chest: Effort normal and breath sounds normal.  Abdominal: Soft. Bowel sounds are normal. She exhibits no distension. There is no tenderness.  Musculoskeletal: Normal range of motion. She exhibits no edema and no tenderness.  Neurological: She is alert and oriented to person, place, and time.  Skin: Skin is warm and dry.  Nice healing wound to right knee- no sign of infection   Psychiatric: She has a normal mood and affect. Her behavior is normal. Judgment and thought content normal.   * Patient difficult to assess because she is confined to wheel chair.  BP 153/104  Pulse 79  Temp(Src) 97.7 F (36.5 C) (Oral)  LMP 08/09/2002       Assessment & Plan:

## 2014-01-02 LAB — NMR, LIPOPROFILE
Cholesterol: 148 mg/dL (ref 100–199)
HDL Cholesterol by NMR: 85 mg/dL (ref >39)
HDL Particle Number: 51.1 umol/L (ref >=30.5)
LDL Particle Number: 854 nmol/L (ref <1000)
LDL SIZE: 20.5 nm (ref >20.5)
LDLC SERPL CALC-MCNC: 48 mg/dL (ref 0–99)
LP-IR Score: 39 (ref <=45)
SMALL LDL PARTICLE NUMBER: 536 nmol/L — AB (ref <=527)
TRIGLYCERIDES BY NMR: 73 mg/dL (ref 0–149)

## 2014-01-02 LAB — CMP14+EGFR
A/G RATIO: 1.7 (ref 1.1–2.5)
ALT: 19 IU/L (ref 0–32)
AST: 19 IU/L (ref 0–40)
Albumin: 4.2 g/dL (ref 3.6–4.8)
Alkaline Phosphatase: 63 IU/L (ref 39–117)
BILIRUBIN TOTAL: 0.2 mg/dL (ref 0.0–1.2)
BUN/Creatinine Ratio: 11 (ref 11–26)
BUN: 10 mg/dL (ref 8–27)
CO2: 19 mmol/L (ref 18–29)
Calcium: 9.5 mg/dL (ref 8.7–10.3)
Chloride: 97 mmol/L (ref 97–108)
Creatinine, Ser: 0.87 mg/dL (ref 0.57–1.00)
GFR calc non Af Amer: 71 mL/min/{1.73_m2} (ref >59)
GFR, EST AFRICAN AMERICAN: 82 mL/min/{1.73_m2} (ref >59)
Globulin, Total: 2.5 g/dL (ref 1.5–4.5)
Glucose: 81 mg/dL (ref 65–99)
POTASSIUM: 5.5 mmol/L — AB (ref 3.5–5.2)
Sodium: 139 mmol/L (ref 134–144)
Total Protein: 6.7 g/dL (ref 6.0–8.5)

## 2014-01-07 ENCOUNTER — Other Ambulatory Visit (INDEPENDENT_AMBULATORY_CARE_PROVIDER_SITE_OTHER): Payer: Medicare Other

## 2014-01-07 DIAGNOSIS — R7989 Other specified abnormal findings of blood chemistry: Secondary | ICD-10-CM

## 2014-01-08 LAB — POTASSIUM: Potassium: 4.7 mmol/L (ref 3.5–5.2)

## 2014-01-15 ENCOUNTER — Other Ambulatory Visit: Payer: Self-pay | Admitting: Nurse Practitioner

## 2014-01-28 ENCOUNTER — Telehealth: Payer: Self-pay | Admitting: Family Medicine

## 2014-01-28 DIAGNOSIS — M519 Unspecified thoracic, thoracolumbar and lumbosacral intervertebral disc disorder: Secondary | ICD-10-CM

## 2014-01-28 MED ORDER — ESOMEPRAZOLE MAGNESIUM 40 MG PO CPDR: 40.0000 mg | DELAYED_RELEASE_CAPSULE | Freq: Every day | ORAL | Status: DC

## 2014-01-28 MED ORDER — MELOXICAM 15 MG PO TABS: 15.0000 mg | ORAL_TABLET | Freq: Every day | ORAL | Status: DC

## 2014-01-28 MED ORDER — ATORVASTATIN CALCIUM 40 MG PO TABS: 40.0000 mg | ORAL_TABLET | Freq: Every day | ORAL | Status: DC

## 2014-01-28 NOTE — Telephone Encounter (Signed)
done

## 2014-02-12 ENCOUNTER — Other Ambulatory Visit: Payer: Self-pay | Admitting: Nurse Practitioner

## 2014-02-15 ENCOUNTER — Encounter (HOSPITAL_COMMUNITY): Payer: Self-pay | Admitting: Emergency Medicine

## 2014-02-15 ENCOUNTER — Emergency Department (HOSPITAL_COMMUNITY)
Admission: EM | Admit: 2014-02-15 | Discharge: 2014-02-15 | Disposition: A | Discharge status: Home / Self Care | Payer: PRIVATE HEALTH INSURANCE | Attending: Emergency Medicine | Admitting: Emergency Medicine

## 2014-02-15 DIAGNOSIS — M1990 Unspecified osteoarthritis, unspecified site: Secondary | ICD-10-CM | POA: Diagnosis not present

## 2014-02-15 DIAGNOSIS — Z9889 Other specified postprocedural states: Secondary | ICD-10-CM | POA: Insufficient documentation

## 2014-02-15 DIAGNOSIS — Z791 Long term (current) use of non-steroidal anti-inflammatories (NSAID): Secondary | ICD-10-CM | POA: Diagnosis not present

## 2014-02-15 DIAGNOSIS — K209 Esophagitis, unspecified without bleeding: Secondary | ICD-10-CM

## 2014-02-15 DIAGNOSIS — F329 Major depressive disorder, single episode, unspecified: Secondary | ICD-10-CM | POA: Diagnosis not present

## 2014-02-15 DIAGNOSIS — I10 Essential (primary) hypertension: Secondary | ICD-10-CM | POA: Insufficient documentation

## 2014-02-15 DIAGNOSIS — J45909 Unspecified asthma, uncomplicated: Secondary | ICD-10-CM | POA: Diagnosis not present

## 2014-02-15 DIAGNOSIS — F410 Panic disorder [episodic paroxysmal anxiety] without agoraphobia: Secondary | ICD-10-CM | POA: Insufficient documentation

## 2014-02-15 DIAGNOSIS — E780 Pure hypercholesterolemia: Secondary | ICD-10-CM | POA: Insufficient documentation

## 2014-02-15 DIAGNOSIS — K219 Gastro-esophageal reflux disease without esophagitis: Secondary | ICD-10-CM | POA: Insufficient documentation

## 2014-02-15 DIAGNOSIS — Z79899 Other long term (current) drug therapy: Secondary | ICD-10-CM | POA: Insufficient documentation

## 2014-02-15 DIAGNOSIS — Z7982 Long term (current) use of aspirin: Secondary | ICD-10-CM | POA: Diagnosis not present

## 2014-02-15 DIAGNOSIS — R1013 Epigastric pain: Secondary | ICD-10-CM | POA: Diagnosis present

## 2014-02-15 HISTORY — DX: Pure hypercholesterolemia, unspecified: E78.00

## 2014-02-15 HISTORY — DX: Panic disorder (episodic paroxysmal anxiety): F41.0

## 2014-02-15 HISTORY — DX: Gastro-esophageal reflux disease without esophagitis: K21.9

## 2014-02-15 MED ORDER — GI COCKTAIL ~~LOC~~
30.0000 mL | Freq: Once | ORAL | Status: AC
  Administered 2014-02-15: 30 mL via ORAL
  Filled 2014-02-15: qty 30

## 2014-02-15 NOTE — ED Provider Notes (Signed)
CSN: [id number]     Arrival date & time 02/15/14  [id number] History  This chart was scribed for Lauren MelterElliott L Mahathi Pokorney, MD by Leone PayorSonum Patel, ED Scribe. This patient was seen in room APA07/APA07 and the patient's care was started 10:09 AM.    Chief Complaint  Patient presents with  . Abdominal Pain    The history is provided by the patient. No language interpreter was used.    HPI Comments: Lauren Harmon is a 63 y.o. female with past medical history of GERD who presents to the Emergency Department complaining of daily episodes of unchanged epigastric abdominal discomfort that occurs each morning for the past 2 weeks. Patient describes this as "a shaking, nervous stomach". She reports previous episodes in the past for which she was prescribed Phenergan, but cannot recall exactly when this was. She states food and oral medications are becoming stuck after she swallows them, though she denies this same trouble with consuming fluids. She denies nausea, vomiting, diarrhea, fever.   Past Medical History  Diagnosis Date  . Anxiety   . H/O: knee surgery   . Hypertension   . Allergy   . Depression   . Asthma   . Osteoarthritis   . Palpitations   . Osteopenia   . Hemorrhoid   . Panic attack   . Hypercholesteremia   . GERD (gastroesophageal reflux disease)    Past Surgical History  Procedure Laterality Date  . Eye surgery     Family History  Problem Relation Age of Onset  . Osteoarthritis Mother   . Hypertension Mother   . Hypertension Father   . Hyperlipidemia Father   . Heart disease Father   . Osteoarthritis Father   . Cancer Father     colon  . Cancer Maternal Uncle     lung-smoker   History  Substance Use Topics  . Smoking status: Never Smoker   . Smokeless tobacco: Not on file  . Alcohol Use: No   OB History   Grav Para Term Preterm Abortions TAB SAB Ect Mult Living                 Review of Systems  Constitutional: Negative for fever.  Gastrointestinal: Positive for  abdominal pain. Negative for nausea, vomiting and diarrhea.  All other systems reviewed and are negative.     Allergies  Wellbutrin  Home Medications   Prior to Admission medications   Medication Sig Start Date End Date Taking? Authorizing Provider  ALPRAZolam Prudy Feeler(XANAX) 1 MG tablet Take 1 mg by mouth 3 (three) times daily.   Yes Historical Provider, MD  amLODipine-benazepril (LOTREL) 5-20 MG per capsule Take 1 capsule by mouth daily. 10/31/13  Yes Mary-Margaret Daphine DeutscherMartin, FNP  aspirin EC 81 MG tablet Take 81 mg by mouth daily.   Yes Historical Provider, MD  atorvastatin (LIPITOR) 40 MG tablet Take 1 tablet (40 mg total) by mouth daily. 01/28/14  Yes Mary-Margaret Daphine DeutscherMartin, FNP  cholecalciferol (VITAMIN D) 1000 UNITS tablet Take 1,000 Units by mouth daily.   Yes Historical Provider, MD  cyclobenzaprine (FLEXERIL) 10 MG tablet Take 10 mg by mouth 3 (three) times daily as needed for muscle spasms.   Yes Historical Provider, MD  esomeprazole (NEXIUM) 40 MG capsule Take 1 capsule (40 mg total) by mouth daily. 01/28/14  Yes Mary-Margaret Daphine DeutscherMartin, FNP  furosemide (LASIX) 20 MG tablet Take 1 tablet (20 mg total) by mouth 2 (two) times daily. 01/01/14  Yes Mary-Margaret Daphine DeutscherMartin, FNP  HYDROcodone-acetaminophen (NORCO) 10-325 MG  per tablet Take 1 tablet by mouth every 6 (six) hours as needed for moderate pain.   Yes Historical Provider, MD  meloxicam (MOBIC) 15 MG tablet Take 1 tablet (15 mg total) by mouth daily. 01/28/14  Yes Mary-Margaret [name], FNP  montelukast (SINGULAIR) 10 MG tablet Take 1 tablet (10 mg total) by mouth at bedtime. 10/31/13  Yes Mary-Margaret [name], FNP  Multiple Vitamin (MULTIVITAMIN WITH MINERALS) TABS tablet Take 1 tablet by mouth daily.   Yes Historical Provider, MD  promethazine (PHENERGAN) 12.5 MG tablet Take 12.5 mg by mouth at bedtime as needed for nausea or vomiting.   Yes Historical Provider, MD  senna (SENOKOT) 8.6 MG TABS tablet Take 2 tablets by mouth at bedtime.   Yes  Historical Provider, MD  venlafaxine XR (EFFEXOR-XR) 150 MG 24 hr capsule Take 150 mg by mouth 2 (two) times daily.   Yes Historical Provider, MD   BP 142/97  Pulse 81  Temp(Src) 97.7 F (36.5 C) (Oral)  Resp 16  Ht 5\' 6"  (1.676 m)  Wt 273 lb (123.832 kg)  BMI 44.08 kg/m2  SpO2 98%  LMP 08/09/2002 Physical Exam  Nursing note and vitals reviewed. Constitutional: She is oriented to person, place, and time. She appears well-developed and well-nourished.  HENT:  Head: Normocephalic and atraumatic.  Eyes: Conjunctivae and EOM are normal. Pupils are equal, round, and reactive to light.  Neck: Normal range of motion and phonation normal. Neck supple.  Cardiovascular: Normal rate and regular rhythm.   Pulmonary/Chest: Effort normal and breath sounds normal. She exhibits no tenderness.  Abdominal: Soft. She exhibits no distension. There is no tenderness. There is no guarding.  Musculoskeletal: Normal range of motion.  Neurological: She is alert and oriented to person, place, and time. She exhibits normal muscle tone.  Skin: Skin is warm and dry.  Psychiatric: She has a normal mood and affect. Her behavior is normal. Judgment and thought content normal.    ED Course  Procedures   DIAGNOSTIC STUDIES: Oxygen Saturation is 100% on RA, normal by my interpretation.    COORDINATION OF CARE: 10:16 AM Discussed treatment plan with pt at bedside and pt agreed to plan.  12:25 PM Discussed discharge plans with patient.    Labs Review Labs Reviewed - No data to display  Imaging Review No results found.   EKG Interpretation None      MDM   Final diagnoses:  Esophagitis    Nonspecific symptoms, most likely related to GERD, with possibly esophagitis. She was recently seen by her PCP, and had her prescriptions for meloxicam, refilled, secondary to right knee pain. Doubt ACS, PE, or pneumonia.   Nursing Notes Reviewed/ Care Coordinated Applicable Imaging Reviewed Interpretation of  Laboratory Data incorporated into ED treatment  The patient appears reasonably screened and/or stabilized for discharge and I doubt any other medical condition or other [facility] requiring further screening, evaluation, or treatment in the ED at this time prior to discharge.  Plan: Home Medications- antacids of choice. A.c., at bedtime; Home Treatments- rest; return here if the recommended treatment, does not improve the symptoms; Recommended follow up- PCP. Followup 1 week consider referral to GI \  I personally performed the services described in this documentation, which was scribed in my presence. The recorded information has been reviewed and is accurate.       Lauren Melter, MD 02/15/14 [phone]

## 2014-02-15 NOTE — ED Notes (Signed)
Epigastric  pain for 2 weeks.  Feels like it is difficulty to swallow her food.

## 2014-02-15 NOTE — Discharge Instructions (Signed)
Her symptoms are likely related to the esophagitis. Stop taking the meloxicam, as it may be causing the problem. Take Maalox 2 tablespoons, before meals and at bedtime, to help control the symptoms. Follow up with your primary care doctor for a checkup in 2 or 3 days. He may need to refer you to a gastroenterologist, for further evaluation and treatment.     Esophagitis Esophagitis is inflammation of the esophagus. It can involve swelling, soreness, and pain in the esophagus. This condition can make it difficult and painful to swallow. CAUSES  Most causes of esophagitis are not serious. Many different factors can cause esophagitis, including:  Gastroesophageal reflux disease (GERD). This is when acid from your stomach flows up into the esophagus.  Recurrent vomiting.  An allergic-type reaction.  Certain medicines, especially those that come in large pills.  Ingestion of harmful chemicals, such as household cleaning products.  Heavy alcohol use.  An infection of the esophagus.  Radiation treatment for cancer.  Certain diseases such as sarcoidosis, Crohn's disease, and scleroderma. These diseases may cause recurrent esophagitis. SYMPTOMS   Trouble swallowing.  Painful swallowing.  Chest pain.  Difficulty breathing.  Nausea.  Vomiting.  Abdominal pain. DIAGNOSIS  Your caregiver will take your history and do a physical exam. Depending upon what your caregiver finds, certain tests may also be done, including:  Barium X-ray. You will drink a solution that coats the esophagus, and X-rays will be taken.  Endoscopy. A lighted tube is put down the esophagus so your caregiver can examine the area.  Allergy tests. These can sometimes be arranged through follow-up visits. TREATMENT  Treatment will depend on the cause of your esophagitis. In some cases, steroids or other medicines may be given to help relieve your symptoms or to treat the underlying cause of your condition.  Medicines that may be recommended include:  Viscous lidocaine, to soothe the esophagus.  Antacids.  Acid reducers.  Proton pump inhibitors.  Antiviral medicines for certain viral infections of the esophagus.  Antifungal medicines for certain fungal infections of the esophagus.  Antibiotic medicines, depending on the cause of the esophagitis. HOME CARE INSTRUCTIONS   Avoid foods and drinks that seem to make your symptoms worse.  Eat small, frequent meals instead of large meals.  Avoid eating for the 3 hours prior to your bedtime.  If you have trouble taking pills, use a pill splitter to decrease the size and likelihood of the pill getting stuck or injuring the esophagus on the way down. Drinking water after taking a pill also helps.  Stop smoking if you smoke.  Maintain a healthy weight.  Wear loose-fitting clothing. Do not wear anything tight around your waist that causes pressure on your stomach.  Raise the head of your bed 6 to 8 inches with wood blocks to help you sleep. Extra pillows will not help.  Only take over-the-counter or prescription medicines as directed by your caregiver. SEEK IMMEDIATE MEDICAL CARE IF:  You have severe chest pain that radiates into your arm, neck, or jaw.  You feel sweaty, dizzy, or lightheaded.  You have shortness of breath.  You vomit blood.  You have difficulty or pain with swallowing.  You have bloody or black, tarry stools.  You have a fever.  You have a burning sensation in the chest more than 3 times a week for more than 2 weeks.  You cannot swallow, drink, or eat.  You drool because you cannot swallow your saliva. MAKE SURE YOU:  Understand  these instructions.  Will watch your condition.  Will get help right away if you are not doing well or get worse. Document Released: 06/09/2004 Document Revised: 07/25/2011 Document Reviewed: 12/31/2010 [facility] Patient Information 2015 [place] [place]. This information is not  intended to replace advice given to you by your health care provider. Make sure you discuss any questions you have with your health care provider.

## 2014-02-19 ENCOUNTER — Other Ambulatory Visit: Payer: Self-pay | Admitting: Nurse Practitioner

## 2014-02-20 ENCOUNTER — Telehealth: Payer: Self-pay | Admitting: Nurse Practitioner

## 2014-02-20 NOTE — Telephone Encounter (Signed)
Was done on 02/15/14

## 2014-02-21 MED ORDER — ALPRAZOLAM 1 MG PO TABS: 1.0000 mg | ORAL_TABLET | Freq: Three times a day (TID) | ORAL | Status: DC

## 2014-02-21 NOTE — Telephone Encounter (Signed)
Called into pharmacy

## 2014-02-21 NOTE — Telephone Encounter (Signed)
Please call in xanax 1mg 1 po TID #90 with 1 refills 

## 2014-03-17 ENCOUNTER — Other Ambulatory Visit: Payer: Self-pay | Admitting: Nurse Practitioner

## 2014-03-27 ENCOUNTER — Ambulatory Visit (INDEPENDENT_AMBULATORY_CARE_PROVIDER_SITE_OTHER): Payer: Medicare Other | Admitting: Nurse Practitioner

## 2014-03-27 ENCOUNTER — Ambulatory Visit: Payer: Medicare Other | Admitting: Nurse Practitioner

## 2014-03-27 ENCOUNTER — Encounter: Payer: Self-pay | Admitting: Nurse Practitioner

## 2014-03-27 VITALS — BP 171/98 | HR 94 | Temp 97.6°F

## 2014-03-27 DIAGNOSIS — R1314 Dysphagia, pharyngoesophageal phase: Secondary | ICD-10-CM

## 2014-03-27 NOTE — Progress Notes (Signed)
   Subjective:    Patient ID: Ander Sladeorothy J Prosser, female    DOB: 06/15/1950, 63 y.o.   MRN: [id number]  HPI Patient in this morning c/o trouble swallowing- Says that she has  No trouble swallowing liquids but has trouble swallow her meds and foods. Had to have espphagus stretched many years ago.    Review of Systems  Constitutional: Negative.   HENT: Negative.   Respiratory: Negative.   Cardiovascular: Negative.   Genitourinary: Negative.   Neurological: Negative.   Psychiatric/Behavioral: Negative.   All other systems reviewed and are negative.      Objective:   Physical Exam  Constitutional: She is oriented to person, place, and time. She appears well-developed and well-nourished.  Cardiovascular: Normal rate, regular rhythm and normal heart sounds.   Pulmonary/Chest: Effort normal and breath sounds normal.  Neurological: She is alert and oriented to person, place, and time.  Skin: Skin is warm and dry.  Psychiatric: She has a normal mood and affect. Her behavior is normal. Judgment and thought content normal.   BP 171/98 mmHg  Pulse 94  Temp(Src) 97.6 F (36.4 C) (Oral)  Ht   Wt   LMP 08/09/2002        Assessment & Plan:   1. Dysphagia, pharyngoesophageal phase    Orders Placed This Encounter  Procedures  . Ambulatory referral to Gastroenterology    Referral Priority:  Routine    Referral Type:  Consultation    Referral Reason:  Specialty Services Required    Requested Specialty:  Gastroenterology    Number of Visits Requested:  1   Chew foods very well before swallowing Drinks lots of liquids with meals RTO prn  Mary-Margaret Daphine DeutscherMartin, FNP

## 2014-03-27 NOTE — Patient Instructions (Signed)

## 2014-04-04 ENCOUNTER — Ambulatory Visit (INDEPENDENT_AMBULATORY_CARE_PROVIDER_SITE_OTHER): Payer: Medicare Other | Admitting: Family Medicine

## 2014-04-04 ENCOUNTER — Telehealth: Payer: Self-pay | Admitting: Family Medicine

## 2014-04-04 ENCOUNTER — Encounter: Payer: Self-pay | Admitting: Family Medicine

## 2014-04-04 VITALS — BP 140/79 | HR 105 | Temp 97.6°F | Ht 66.0 in | Wt 273.0 lb

## 2014-04-04 DIAGNOSIS — E785 Hyperlipidemia, unspecified: Secondary | ICD-10-CM

## 2014-04-04 DIAGNOSIS — I10 Essential (primary) hypertension: Secondary | ICD-10-CM

## 2014-04-04 DIAGNOSIS — F329 Major depressive disorder, single episode, unspecified: Secondary | ICD-10-CM

## 2014-04-04 DIAGNOSIS — F32A Depression, unspecified: Secondary | ICD-10-CM

## 2014-04-04 DIAGNOSIS — K222 Esophageal obstruction: Secondary | ICD-10-CM

## 2014-04-04 MED ORDER — CITALOPRAM HYDROBROMIDE 20 MG PO TABS: 20.0000 mg | ORAL_TABLET | Freq: Every day | ORAL | Status: DC

## 2014-04-04 MED ORDER — CLONAZEPAM 0.5 MG PO TABS: 0.5000 mg | ORAL_TABLET | Freq: Two times a day (BID) | ORAL | Status: DC | PRN

## 2014-04-04 NOTE — Telephone Encounter (Signed)
Per [name]- presc. For clon[facility]epam [facility] at around 1[facility].  [facility] Pt made aware

## 2014-04-04 NOTE — Progress Notes (Signed)
   Subjective:    Patient ID: Lauren Harmon, female    DOB: 08/01/1950, 63 y.o.   MRN: [id number]  HPI 63 year old African-American female here to follow-up hypertension. She [facility] but prior to surgery was discovered she had a fracture of the right femur and she is in the process of being treated for that. At this time she is nonweightbearing.  She describes some anxiety and depression accompanied by thoughts of hurting herself. She is on venlafaxine as well as alprazolam. It would seem this combination of psychotropic drugs is not working as expected.    Review of Systems  Musculoskeletal: Positive for arthralgias.  Psychiatric/Behavioral: The patient is nervous/anxious and is hyperactive.        Objective:   Physical Exam  Constitutional: She is oriented to person, place, and time. She appears well-developed and well-nourished.  Seated in wheelchair due to fracture leg and nonweightbearing status  HENT:  Head: Normocephalic.  Eyes: Conjunctivae and EOM are normal. Pupils are equal, round, and reactive to light.  Neck: Normal range of motion. Neck supple.  Cardiovascular: Normal rate, regular rhythm and normal heart sounds.   Pulmonary/Chest: Effort normal and breath sounds normal.  Abdominal: Soft. Bowel sounds are normal.  Musculoskeletal: Normal range of motion.  Neurological: She is alert and oriented to person, place, and time. She has normal reflexes.  Skin: Skin is warm and dry.  Psychiatric: She has a normal mood and affect. Her behavior is normal. Judgment and thought content normal.    BP 140/79 mmHg  Pulse 105  Temp(Src) 97.6 F (36.4 C) (Oral)  Ht 5\' 6"  (1.676 m)  Wt 273 lb (123.832 kg)  BMI 44.08 kg/m2  LMP 08/09/2002      Assessment & Plan:  1. Essential hypertension   2. Hyperlipidemia 1. Essential hypertension   2. Hyperlipidemia   3taper venlaxifene and begin celexa . Depression Also clonazepam for  alprzolam

## 2014-04-07 ENCOUNTER — Telehealth: Payer: Self-pay | Admitting: Family Medicine

## 2014-04-07 NOTE — Telephone Encounter (Signed)
Stp, she was confused on how to taper her Xanax and Effexor and when to start the new regimen of celexa and klonopin, reviewed taper instructions and when to start new regimen, pt voiced understanding, will close call.

## 2014-04-07 NOTE — Telephone Encounter (Signed)
Discussed meds with her.

## 2014-04-08 ENCOUNTER — Encounter: Payer: Self-pay | Admitting: Internal Medicine

## 2014-04-08 NOTE — Telephone Encounter (Signed)
Discussed change in medications again with patient.

## 2014-04-09 ENCOUNTER — Telehealth: Payer: Self-pay | Admitting: *Deleted

## 2014-04-09 ENCOUNTER — Encounter (HOSPITAL_COMMUNITY): Payer: Self-pay | Admitting: Emergency Medicine

## 2014-04-09 ENCOUNTER — Emergency Department (HOSPITAL_COMMUNITY): Payer: PRIVATE HEALTH INSURANCE

## 2014-04-09 ENCOUNTER — Emergency Department (HOSPITAL_COMMUNITY)
Admission: EM | Admit: 2014-04-09 | Discharge: 2014-04-12 | Disposition: A | Discharge status: Home / Self Care | Payer: PRIVATE HEALTH INSURANCE | Attending: Emergency Medicine | Admitting: Emergency Medicine

## 2014-04-09 DIAGNOSIS — E780 Pure hypercholesterolemia: Secondary | ICD-10-CM | POA: Diagnosis not present

## 2014-04-09 DIAGNOSIS — J45909 Unspecified asthma, uncomplicated: Secondary | ICD-10-CM | POA: Diagnosis not present

## 2014-04-09 DIAGNOSIS — G8929 Other chronic pain: Secondary | ICD-10-CM | POA: Insufficient documentation

## 2014-04-09 DIAGNOSIS — R45851 Suicidal ideations: Secondary | ICD-10-CM

## 2014-04-09 DIAGNOSIS — Z7982 Long term (current) use of aspirin: Secondary | ICD-10-CM | POA: Insufficient documentation

## 2014-04-09 DIAGNOSIS — I10 Essential (primary) hypertension: Secondary | ICD-10-CM | POA: Insufficient documentation

## 2014-04-09 DIAGNOSIS — F329 Major depressive disorder, single episode, unspecified: Secondary | ICD-10-CM | POA: Diagnosis not present

## 2014-04-09 DIAGNOSIS — F99 Mental disorder, not otherwise specified: Secondary | ICD-10-CM

## 2014-04-09 DIAGNOSIS — Z79899 Other long term (current) drug therapy: Secondary | ICD-10-CM | POA: Diagnosis not present

## 2014-04-09 DIAGNOSIS — Z791 Long term (current) use of non-steroidal anti-inflammatories (NSAID): Secondary | ICD-10-CM | POA: Insufficient documentation

## 2014-04-09 DIAGNOSIS — K219 Gastro-esophageal reflux disease without esophagitis: Secondary | ICD-10-CM | POA: Diagnosis not present

## 2014-04-09 DIAGNOSIS — Z9889 Other specified postprocedural states: Secondary | ICD-10-CM | POA: Insufficient documentation

## 2014-04-09 DIAGNOSIS — F29 Unspecified psychosis not due to a substance or known physiological condition: Secondary | ICD-10-CM | POA: Insufficient documentation

## 2014-04-09 DIAGNOSIS — M1990 Unspecified osteoarthritis, unspecified site: Secondary | ICD-10-CM | POA: Diagnosis not present

## 2014-04-09 DIAGNOSIS — M25561 Pain in right knee: Secondary | ICD-10-CM | POA: Diagnosis not present

## 2014-04-09 DIAGNOSIS — F419 Anxiety disorder, unspecified: Secondary | ICD-10-CM | POA: Diagnosis not present

## 2014-04-09 DIAGNOSIS — Z046 Encounter for general psychiatric examination, requested by authority: Secondary | ICD-10-CM | POA: Diagnosis present

## 2014-04-09 LAB — COMPREHENSIVE METABOLIC PANEL
ALBUMIN: 3.7 g/dL (ref 3.5–5.2)
ALT: 14 U/L (ref 0–35)
ANION GAP: 13 (ref 5–15)
AST: 15 U/L (ref 0–37)
Alkaline Phosphatase: 64 U/L (ref 39–117)
BILIRUBIN TOTAL: 0.3 mg/dL (ref 0.3–1.2)
BUN: 6 mg/dL (ref 6–23)
CHLORIDE: 94 meq/L — AB (ref 96–112)
CO2: 24 mEq/L (ref 19–32)
CREATININE: 0.82 mg/dL (ref 0.50–1.10)
Calcium: 9.4 mg/dL (ref 8.4–10.5)
GFR calc Af Amer: 86 mL/min — ABNORMAL LOW (ref >90)
GFR calc non Af Amer: 75 mL/min — ABNORMAL LOW (ref >90)
Glucose, Bld: 113 mg/dL — ABNORMAL HIGH (ref 70–99)
POTASSIUM: 3.7 meq/L (ref 3.7–5.3)
Sodium: 131 mEq/L — ABNORMAL LOW (ref 137–147)
Total Protein: 7 g/dL (ref 6.0–8.3)

## 2014-04-09 LAB — CBC WITH DIFFERENTIAL/PLATELET
BASOS ABS: 0 10*3/uL (ref 0.0–0.1)
BASOS PCT: 0 % (ref 0–1)
Eosinophils Absolute: 0 10*3/uL (ref 0.0–0.7)
Eosinophils Relative: 0 % (ref 0–5)
HCT: 39.7 % (ref 36.0–46.0)
Hemoglobin: 13.6 g/dL (ref 12.0–15.0)
Lymphocytes Relative: 20 % (ref 12–46)
Lymphs Abs: 1 10*3/uL (ref 0.7–4.0)
MCH: 29.2 pg (ref 26.0–34.0)
MCHC: 34.3 g/dL (ref 30.0–36.0)
MCV: 85.2 fL (ref 78.0–100.0)
MONO ABS: 0.7 10*3/uL (ref 0.1–1.0)
Monocytes Relative: 13 % — ABNORMAL HIGH (ref 3–12)
NEUTROS ABS: 3.5 10*3/uL (ref 1.7–7.7)
NEUTROS PCT: 67 % (ref 43–77)
Platelets: 196 10*3/uL (ref 150–400)
RBC: 4.66 MIL/uL (ref 3.87–5.11)
RDW: 13.5 % (ref 11.5–15.5)
WBC: 5.2 10*3/uL (ref 4.0–10.5)

## 2014-04-09 LAB — ETHANOL: Alcohol, Ethyl (B): <11 mg/dL (ref 0–11)

## 2014-04-09 MED ORDER — MONTELUKAST SODIUM 10 MG PO TABS
10.0000 mg | ORAL_TABLET | Freq: Every morning | ORAL | Status: DC
  Administered 2014-04-11 – 2014-04-12 (×2): 10 mg via ORAL
  Filled 2014-04-09 (×2): qty 1

## 2014-04-09 MED ORDER — VENLAFAXINE HCL ER 75 MG PO CP24
ORAL_CAPSULE | ORAL | Status: AC
  Filled 2014-04-09: qty 2

## 2014-04-09 MED ORDER — ONDANSETRON HCL 4 MG PO TABS: 4.0000 mg | ORAL_TABLET | Freq: Three times a day (TID) | ORAL | Status: DC | PRN

## 2014-04-09 MED ORDER — IBUPROFEN 400 MG PO TABS: 600.0000 mg | ORAL_TABLET | Freq: Three times a day (TID) | ORAL | Status: DC | PRN

## 2014-04-09 MED ORDER — ALUM & MAG HYDROXIDE-SIMETH 200-200-20 MG/5ML PO SUSP
30.0000 mL | ORAL | Status: DC | PRN
  Administered 2014-04-11: 30 mL via ORAL
  Filled 2014-04-09: qty 30

## 2014-04-09 MED ORDER — CITALOPRAM HYDROBROMIDE 20 MG PO TABS
20.0000 mg | ORAL_TABLET | Freq: Every day | ORAL | Status: DC
  Filled 2014-04-09 (×4): qty 1

## 2014-04-09 MED ORDER — ACETAMINOPHEN 325 MG PO TABS: 650.0000 mg | ORAL_TABLET | ORAL | Status: DC | PRN

## 2014-04-09 MED ORDER — VITAMIN D (ERGOCALCIFEROL) 1.25 MG (50000 UNIT) PO CAPS: 50000.0000 [IU] | ORAL_CAPSULE | ORAL | Status: DC

## 2014-04-09 MED ORDER — ZOLPIDEM TARTRATE 5 MG PO TABS: 10.0000 mg | ORAL_TABLET | Freq: Every evening | ORAL | Status: DC | PRN

## 2014-04-09 MED ORDER — VENLAFAXINE HCL ER 37.5 MG PO CP24
150.0000 mg | ORAL_CAPSULE | Freq: Once | ORAL | Status: AC
  Administered 2014-04-10: 150 mg via ORAL
  Filled 2014-04-09: qty 4

## 2014-04-09 MED ORDER — BENAZEPRIL HCL 10 MG PO TABS
20.0000 mg | ORAL_TABLET | Freq: Every day | ORAL | Status: DC
  Administered 2014-04-10 – 2014-04-12 (×3): 20 mg via ORAL
  Filled 2014-04-09 (×2): qty 2
  Filled 2014-04-09: qty 1
  Filled 2014-04-09: qty 2
  Filled 2014-04-09: qty 1

## 2014-04-09 MED ORDER — CLONAZEPAM 0.5 MG PO TABS
0.5000 mg | ORAL_TABLET | Freq: Two times a day (BID) | ORAL | Status: DC | PRN
  Administered 2014-04-09 – 2014-04-12 (×7): 0.5 mg via ORAL
  Filled 2014-04-09 (×7): qty 1

## 2014-04-09 MED ORDER — VENLAFAXINE HCL ER 37.5 MG PO CP24: 150.0000 mg | ORAL_CAPSULE | Freq: Once | ORAL | Status: DC

## 2014-04-09 MED ORDER — CITALOPRAM HYDROBROMIDE 20 MG PO TABS
ORAL_TABLET | ORAL | Status: AC
  Filled 2014-04-09: qty 1

## 2014-04-09 MED ORDER — AMLODIPINE BESYLATE 5 MG PO TABS
5.0000 mg | ORAL_TABLET | Freq: Every day | ORAL | Status: DC
  Administered 2014-04-10 – 2014-04-12 (×3): 5 mg via ORAL
  Filled 2014-04-09 (×3): qty 1

## 2014-04-09 MED ORDER — ATORVASTATIN CALCIUM 40 MG PO TABS
40.0000 mg | ORAL_TABLET | Freq: Every day | ORAL | Status: DC
  Administered 2014-04-10 – 2014-04-12 (×3): 40 mg via ORAL
  Filled 2014-04-09 (×7): qty 1

## 2014-04-09 MED ORDER — CYCLOBENZAPRINE HCL 10 MG PO TABS
10.0000 mg | ORAL_TABLET | Freq: Three times a day (TID) | ORAL | Status: DC | PRN
  Administered 2014-04-10: 10 mg via ORAL
  Filled 2014-04-09: qty 1

## 2014-04-09 MED ORDER — HYDROCODONE-ACETAMINOPHEN 10-325 MG PO TABS
1.0000 | ORAL_TABLET | Freq: Two times a day (BID) | ORAL | Status: DC | PRN
  Administered 2014-04-10 – 2014-04-11 (×2): 1 via ORAL
  Filled 2014-04-09 (×2): qty 1

## 2014-04-09 MED ORDER — AMLODIPINE BESY-BENAZEPRIL HCL 5-20 MG PO CAPS: 1.0000 | ORAL_CAPSULE | Freq: Every day | ORAL | Status: DC

## 2014-04-09 MED ORDER — PANTOPRAZOLE SODIUM 40 MG PO TBEC
80.0000 mg | DELAYED_RELEASE_TABLET | Freq: Every day | ORAL | Status: DC
Start: 2014-04-10 — End: 2014-04-12
  Administered 2014-04-10 – 2014-04-12 (×3): 80 mg via ORAL
  Filled 2014-04-09 (×3): qty 2

## 2014-04-09 MED ORDER — ASPIRIN EC 81 MG PO TBEC
81.0000 mg | DELAYED_RELEASE_TABLET | Freq: Every morning | ORAL | Status: DC
  Administered 2014-04-10 – 2014-04-12 (×3): 81 mg via ORAL
  Filled 2014-04-09 (×4): qty 1

## 2014-04-09 MED ORDER — BENAZEPRIL HCL 5 MG PO TABS
ORAL_TABLET | ORAL | Status: AC
  Filled 2014-04-09: qty 4

## 2014-04-09 MED ORDER — DOCUSATE SODIUM 100 MG PO CAPS
100.0000 mg | ORAL_CAPSULE | ORAL | Status: DC | PRN
  Administered 2014-04-10: 100 mg via ORAL
  Filled 2014-04-09 (×2): qty 1

## 2014-04-09 MED ORDER — FUROSEMIDE 40 MG PO TABS
20.0000 mg | ORAL_TABLET | ORAL | Status: DC
  Administered 2014-04-09 – 2014-04-11 (×2): 20 mg via ORAL
  Filled 2014-04-09 (×2): qty 1

## 2014-04-09 NOTE — Telephone Encounter (Signed)
Pt called in stating she is very anxious and having suicidal thoughts Instructed pt to go to the ED Pt did call 911, confirmed with [facility] dispatch([phone])

## 2014-04-09 NOTE — BH Assessment (Signed)
Nani SkillernJohn Conrad Withrow, NP recommends inpatient geriatric-psychiatry treatment. Contacted the following facilities:  BED AVAILABLE, FAXED CLINICAL INFORMATION: [facility], per [facility], per [facility], per [facility], per [facility]  AT CAPACITY: [facility], per [facility], per [facility], per Dot LanesKrista  NO RESPONSE: [facility]   [address] Ellis Patsy BaltimoreWarrick Jr, WisconsinLPC, [facility] Triage Specialist [phone]

## 2014-04-09 NOTE — ED Notes (Signed)
Pt requested Klonopin for her anxiety.

## 2014-04-09 NOTE — ED Provider Notes (Addendum)
CSN: [id number]     Arrival date & time 04/09/14  1456 History   First MD Initiated Contact with Patient 04/09/14 1500     Chief Complaint  Patient presents with  . V70.1     (Consider location/radiation/quality/duration/timing/severity/associated sxs/prior Treatment) HPI  Patient reports she has a long history of anxiety and panic attacks. She saw her new PCP for the first time on November 20 and told him she felt like the Effexor and Xanax she was on was not controlling her panic attacks. He has been tapering her off her Effexor, she went from 2 a day to 1 a day for 3 days and now she starting every other day, she will skip tomorrow which is Thursday and also Saturday and will start Celexa on Monday which is 5 days from now. She also has had her Xanax tapered and today she started clonazepam 0.5 mg which she can take twice a day. She states she took one this morning and then about 10 AM she had a panic attack and she took another one. However what she's very concerned about is last night she started having thoughts about harming herself. She states she's never had thoughts like this before. She denies having any type of plan or can't say how she would hurt herself. She states "I don't intend to hurt myself". She states she's never felt this way before. She states her boyfriend died about 3 years ago from cancer. He had diabetes and other medical problems. She feels like she has never felt with his death. She has never had a mental health counselor and when her prior PCP offered her one she refused it several years ago.  Patient states she has chronic knee pain and she was found to have a stress [facility] in [facility] Dr. Davene Costainavid Stanley. [facility]. She is currently in a wheelchair and not allowed to bear any weight on her leg. She is getting some type of bone strengthening treatment which she states when she was seen 2  weeks ago seemed to be having her fracture heal. She is to be rechecked again in another 7 weeks.  PCP Dr Rondel BatonS Miller ([facility])  Past Medical History  Diagnosis Date  . Anxiety   . H/O: knee surgery   . Hypertension   . Allergy   . Depression   . Asthma   . Osteoarthritis   . Palpitations   . Osteopenia   . Hemorrhoid   . Panic attack   . Hypercholesteremia   . GERD (gastroesophageal reflux disease)    Past Surgical History  Procedure Laterality Date  . Eye surgery     Family History  Problem Relation Age of Onset  . Osteoarthritis Mother   . Hypertension Mother   . Hypertension Father   . Hyperlipidemia Father   . Heart disease Father   . Osteoarthritis Father   . Cancer Father     colon  . Cancer Maternal Uncle     lung-smoker   History  Substance Use Topics  . Smoking status: Never Smoker   . Smokeless tobacco: Not on file  . Alcohol Use: No   On disability for back and knee problems Son lives with her  OB History    No data available     Review of Systems  All other systems reviewed and are negative.  Allergies  Wellbutrin  Home Medications   Prior to Admission medications   Medication Sig Start Date End Date Taking? Authorizing Provider  ALPRAZolam Prudy Feeler(XANAX) 1 MG tablet Take 1 mg by mouth every morning.  03/21/14  Yes Historical Provider, MD  amLODipine-benazepril (LOTREL) 5-20 MG per capsule Take 1 capsule by mouth daily. Patient taking differently: Take 1 capsule by mouth every morning.  10/31/13  Yes Mary-Margaret Daphine DeutscherMartin, FNP  aspirin EC 81 MG tablet Take 81 mg by mouth every morning.    Yes Historical Provider, MD  atorvastatin (LIPITOR) 40 MG tablet Take 1 tablet (40 mg total) by mouth daily. Patient taking differently: Take 40 mg by mouth every morning.  01/28/14  Yes Mary-Margaret Daphine DeutscherMartin, FNP  clonazePAM (KLONOPIN) 0.5 MG tablet Take 1 tablet (0.5 mg total) by mouth 2 (two) times daily as needed for anxiety. 04/04/14  Yes  Frederica KusterStephen M Miller, MD  cyclobenzaprine (FLEXERIL) 10 MG tablet Take 10 mg by mouth every morning.    Yes Historical Provider, MD  docusate sodium (COLACE) 100 MG capsule Take 100 mg by mouth every other day as needed for mild constipation.   Yes Historical Provider, MD  esomeprazole (NEXIUM) 40 MG capsule Take 1 capsule (40 mg total) by mouth daily. 01/28/14  Yes Mary-Margaret Daphine DeutscherMartin, FNP  furosemide (LASIX) 20 MG tablet Take 1 tablet (20 mg total) by mouth 2 (two) times daily. Patient taking differently: Take 20 mg by mouth every other day.  01/01/14  Yes Mary-Margaret Daphine DeutscherMartin, FNP  HYDROcodone-acetaminophen (NORCO) 10-325 MG per tablet Take 1 tablet by mouth 2 (two) times daily.    Yes Historical Provider, MD  meloxicam (MOBIC) 15 MG tablet Take 1 tablet (15 mg total) by mouth daily. 01/28/14  Yes Mary-Margaret Daphine DeutscherMartin, FNP  montelukast (SINGULAIR) 10 MG tablet Take 1 tablet (10 mg total) by mouth at bedtime. Patient taking differently: Take 10 mg by mouth every morning.  10/31/13  Yes Mary-Margaret Daphine DeutscherMartin, FNP  promethazine (PHENERGAN) 12.5 MG tablet TAKE 1 TABLET (12.5 MG TOTAL) BY MOUTH EVERY 6 (EIGHT) HOURS AS NEEDED FOR NAUSEA. 03/18/14  Yes Mary-Margaret Daphine DeutscherMartin, FNP  venlafaxine XR (EFFEXOR-XR) 150 MG 24 hr capsule Take 150 mg by mouth every other day.  02/13/14  Yes Historical Provider, MD  Vitamin D, Ergocalciferol, (DRISDOL) 50000 UNITS CAPS capsule Take 50,000 Units by mouth every Tuesday.  02/23/14  Yes Historical Provider, MD  citalopram (CELEXA) 20 MG tablet Take 1 tablet (20 mg total) by mouth daily. 04/04/14   Frederica KusterStephen M Miller, MD   BP 163/92 mmHg  Pulse 92  Temp(Src) 98.6 F (37 C) (Oral)  Resp 26  Ht 5\' 6"  (1.676 m)  Wt 279 lb (126.554 kg)  BMI 45.05 kg/m2  SpO2 98%  LMP 08/09/2002  Vital signs normal   Physical Exam  Constitutional: She is oriented to person, place, and time. She appears well-developed and well-nourished.  Non-toxic appearance. She does not appear ill. No  distress.  HENT:  Head: Normocephalic and atraumatic.  Right Ear: External ear normal.  Left Ear: External ear normal.  Nose: Nose normal. No mucosal edema or rhinorrhea.  Mouth/Throat: Oropharynx is clear and moist and mucous membranes are normal. No dental abscesses or uvula swelling.  Eyes: Conjunctivae and EOM are normal. Pupils are equal, round, and reactive to light.  Neck: Normal range of motion and full passive range of motion without pain. Neck supple.  Cardiovascular: Normal rate, regular rhythm and normal heart sounds.  Exam reveals no gallop and no  friction rub.   No murmur heard. Pulmonary/Chest: Effort normal and breath sounds normal. No respiratory distress. She has no wheezes. She has no rhonchi. She has no rales. She exhibits no tenderness and no crepitus.  Abdominal: Soft. Normal appearance and bowel sounds are normal. She exhibits no distension. There is no tenderness. There is no rebound and no guarding.  Musculoskeletal: Normal range of motion. She exhibits no edema or tenderness.  Moves all extremities well.   Neurological: She is alert and oriented to person, place, and time. She has normal strength. No cranial nerve deficit.  Skin: Skin is warm, dry and intact. No rash noted. No erythema. No pallor.  Psychiatric: She has a normal mood and affect. Her speech is normal and behavior is normal. Her mood appears not anxious.  Nursing note and vitals reviewed.   ED Course  Procedures (including critical care time)  Medications  acetaminophen (TYLENOL) tablet 650 mg (not administered)  ibuprofen (ADVIL,MOTRIN) tablet 600 mg (not administered)  zolpidem (AMBIEN) tablet 10 mg (not administered)  ondansetron (ZOFRAN) tablet 4 mg (not administered)  alum & mag hydroxide-simeth (MAALOX/MYLANTA) 200-200-20 MG/5ML suspension 30 mL (not administered)   15:52 Kristen, TSS called to discuss reason for consult  16:33 Kristen, TSS has talked to the patient who cannot contract  for safety. The patient is agreeable for admission. She is going to try to get her [facility] at [facility]. I'm going to order the extra tests they require for admission.  Labs Review Results for orders placed or performed during the hospital encounter of 04/09/14  CBC WITH DIFFERENTIAL  Result Value Ref Range   WBC 5.2 4.0 - 10.5 K/uL   RBC 4.66 3.87 - 5.11 MIL/uL   Hemoglobin 13.6 12.0 - 15.0 g/dL   HCT 16.1 09.6 - 04.5 %   MCV 85.2 78.0 - 100.0 fL   MCH 29.2 26.0 - 34.0 pg   MCHC 34.3 30.0 - 36.0 g/dL   RDW 40.9 81.1 - 91.4 %   Platelets 196 150 - 400 K/uL   Neutrophils Relative % 67 43 - 77 %   Neutro Abs 3.5 1.7 - 7.7 K/uL   Lymphocytes Relative 20 12 - 46 %   Lymphs Abs 1.0 0.7 - 4.0 K/uL   Monocytes Relative 13 (H) 3 - 12 %   Monocytes Absolute 0.7 0.1 - 1.0 K/uL   Eosinophils Relative 0 0 - 5 %   Eosinophils Absolute 0.0 0.0 - 0.7 K/uL   Basophils Relative 0 0 - 1 %   Basophils Absolute 0.0 0.0 - 0.1 K/uL  Comprehensive metabolic panel  Result Value Ref Range   Sodium 131 (L) 137 - 147 mEq/L   Potassium 3.7 3.7 - 5.3 mEq/L   Chloride 94 (L) 96 - 112 mEq/L   CO2 24 19 - 32 mEq/L   Glucose, Bld 113 (H) 70 - 99 mg/dL   BUN 6 6 - 23 mg/dL   Creatinine, Ser 7.82 0.50 - 1.10 mg/dL   Calcium 9.4 8.4 - 95.6 mg/dL   Total Protein 7.0 6.0 - 8.3 g/dL   Albumin 3.7 3.5 - 5.2 g/dL   AST 15 0 - 37 U/L   ALT 14 0 - 35 U/L   Alkaline Phosphatase 64 39 - 117 U/L   Total Bilirubin 0.3 0.3 - 1.2 mg/dL   GFR calc non Af Amer 75 (L) >90 mL/min   GFR calc Af Amer 86 (L) >90 mL/min   Anion gap 13 5 - 15  Ethanol  Result Value Ref Range   Alcohol, Ethyl (B) <11 0 - 11 mg/dL     Laboratory interpretation all normal except mild hyponatremia and low chloride    Imaging Review Dg Chest 2 View  04/09/2014   CLINICAL DATA:  Chest pain, shortness of breath.  EXAM: CHEST  2 VIEW  COMPARISON:  09/05/2012  FINDINGS: There are low lung volumes without confluent airspace opacity or  effusion. Heart is mildly enlarged. No overt edema. No acute bony abnormality.  IMPRESSION: Low lung volumes.  No acute cardiopulmonary disease.  Mild cardiomegaly.   Electronically Signed   By: Charlett Nose M.D.   On: 04/09/2014 17:59     EKG Interpretation   Date/Time:  Wednesday April 09 2014 17:21:36 EST Ventricular Rate:  79 PR Interval:  147 QRS Duration: 104 QT Interval:  386 QTC Calculation: 442 R Axis:   -6 Text Interpretation:  Sinus rhythm Minimal ST elevation, anterior leads  Otherwise within normal limits No old tracing to compare Confirmed by  Sharion Grieves  MD-I, Kamaiya Antilla ([id number]) on 04/09/2014 5:26:58 PM      MDM   Final diagnoses:  Psychiatric problem  Anxiety  Suicidal ideation    Disposition pending    Devoria Albe, MD, Armando Gang     Ward Givens, MD 04/09/14 1845  Ward Givens, MD 04/10/14 6045
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
## 2014-04-09 NOTE — ED Notes (Signed)
Pt saw MD last Friday and they changed psychiatric meds. States they took her off her Effexor and her xanax and now she is having thoughts of harming self.

## 2014-04-09 NOTE — ED Notes (Signed)
Pt states she can not void at this time 

## 2014-04-09 NOTE — ED Notes (Signed)
AC called for medications not available in Pyxis.

## 2014-04-10 LAB — URINALYSIS, ROUTINE W REFLEX MICROSCOPIC
Bilirubin Urine: NEGATIVE
GLUCOSE, UA: NEGATIVE mg/dL
Hgb urine dipstick: NEGATIVE
KETONES UR: NEGATIVE mg/dL
Leukocytes, UA: NEGATIVE
Nitrite: NEGATIVE
PROTEIN: NEGATIVE mg/dL
Specific Gravity, Urine: 1.01 (ref 1.005–1.030)
Urobilinogen, UA: 0.2 mg/dL (ref 0.0–1.0)
pH: 6 (ref 5.0–8.0)

## 2014-04-10 LAB — RAPID URINE DRUG SCREEN, HOSP PERFORMED
AMPHETAMINES: NOT DETECTED
BENZODIAZEPINES: NOT DETECTED
Barbiturates: NOT DETECTED
Cocaine: NOT DETECTED
OPIATES: POSITIVE — AB
Tetrahydrocannabinol: NOT DETECTED

## 2014-04-10 NOTE — BH Assessment (Signed)
[facility] Assessment Progress Note   The following facilities were contacted in an attempt to place the pt by this clinician:  Faxed Referral For [facility] - beds per [facility] - beds per [facility]   Left Message, No Answer [facility] @ [address] [place] @ [address] [place] @ 1435 (pt referral faxed again) [place] @ 1435  At Capacity [facility] per Ama  TTS will continue to seek placement for the pt.  Casimer LaniusKristen Demetruis Depaul, MS, [facility] Licensed Professional Counselor Therapeutic Triage Specialist [facility] Phone: [phone] Fax: [phone]

## 2014-04-10 NOTE — ED Notes (Signed)
telepsy in progress to reassess.

## 2014-04-10 NOTE — ED Provider Notes (Signed)
Patient resting comfortably this morning without any overnight problems. Continue to evaluate for possible placement secondary to depression and suicidal ideation.  Filed Vitals:   04/10/14 0723  BP: 165/85  Pulse: 81  Temp:   Resp: 16     Gilda Creasehristopher J. Joash Tony, MD 04/10/14 [phone]

## 2014-04-10 NOTE — ED Notes (Signed)
Urine specimen obtained and sent to lab

## 2014-04-10 NOTE — BH Assessment (Signed)
[facility] Assessment Progress Note   Pt seen this day for reassessment.  Pt continues to endorse SI.  She stated she feels like her heart is racing and that she is having panic attacks.  She stated she doesn't feel the new medication is "strong enough," and stated she has asked her nurse for more medication for her "nerves."  Pt denies HI or AVH.  No delusions noted.  Pt also reported her stomach feels "shaky."  Pt had rapid, pressured speech, had depressed and anxious mood with appropriate affect, was oriented x 4, alert, had logical/coherent thoughts and was cooperative.  Pt is pending several inpatient gero facilities.  TTS to continue to seek placement for the pt.  ED and TTS staff updated.  Casimer LaniusKristen Idamay Hosein, MS, [facility] Licensed Professional Counselor Therapeutic Triage Specialist [facility] Phone: [phone] Fax: [phone]

## 2014-04-10 NOTE — ED Notes (Signed)
Pt is anxious, wanting her medication, states she stopped xanax and did not know she was suppose to taper off of it. She feels shaky and anxious

## 2014-04-11 MED ORDER — PROMETHAZINE HCL 12.5 MG PO TABS
12.5000 mg | ORAL_TABLET | ORAL | Status: DC | PRN
  Administered 2014-04-11: 12.5 mg via ORAL
  Filled 2014-04-11: qty 1

## 2014-04-11 NOTE — ED Notes (Signed)
Pt. Allowed phone call.

## 2014-04-11 NOTE — ED Notes (Signed)
Tele-psych at bedside.

## 2014-04-11 NOTE — BH Assessment (Addendum)
[facility] Assessment Progress Note    Pt seen this day for reassessment. Pt continues to endorse SI, stating, "I am still having thoughts I don't like."Pt was concerned that she got one of her medications last night, stating she wants to stay on a schedule with taking her medications.  I expressed this to pt's nurse, Lurena Joinerebecca.  Pt denies HI or AVH. No delusions noted. Pt also reported her stomach feels less "shaky,"and that she felt better after being able to go to the bathroom after a stool softener.  Pt appeared calmer today with normal speech, depressed mood with appropriate affect, was oriented x 4, alert, had logical/coherent thoughts and was cooperative. Pt is pending several inpatient gero facilities.  TTS to continue to seek placement for the pt. ED and TTS staff updated.  Casimer LaniusKristen Hermie Reagor, MS, [facility] Licensed Professional Counselor Therapeutic Triage Specialist [facility] Phone: [phone] Fax: [phone]

## 2014-04-11 NOTE — Progress Notes (Signed)
[facility].  Derrell Lollingoris Milley Vining, MSW  Social Worker [phone]

## 2014-04-11 NOTE — ED Provider Notes (Signed)
Still anxious and depressed, but without suicidal ideation today. Await placement per TTS. [phone]  Hurman HornJohn M Sherria Riemann, MD 04/11/14 [phone]

## 2014-04-11 NOTE — Progress Notes (Signed)
Pt has been assessed and meets inpatient criteria for psychiatric treatment. Pt clinicals faxed to :  [facility]   Will continue to seek placement.  Derrell Lollingoris Mercie Balsley, MSW  Social Worker [phone]

## 2014-04-11 NOTE — BH Assessment (Signed)
Nani SkillernJohn Conrad Withrow, NP recommends inpatient geriatric-psychiatry placement. Contacted the following facilities:  INFORMATION RECEIVED, PT [facility] REVIEW[facility], per [facility], [facility], per Dot LanesKrista  [facility], [facility], per [facility], [facility], per Erskine SquibbJane  NO RESPONSE: [facility]  PT DECLINED: [facility]   Jvion Turgeon Ellis Alaisha Eversley Jr, [facility], [facility] Triage Specialist [phone]

## 2014-04-11 NOTE — ED Notes (Signed)
[facility] who stated that pt would be re-assessed sometime this afternoon. Pt is pending placement at several different places [facility].

## 2014-04-12 DIAGNOSIS — F419 Anxiety disorder, unspecified: Secondary | ICD-10-CM

## 2014-04-12 DIAGNOSIS — F332 Major depressive disorder, recurrent severe without psychotic features: Secondary | ICD-10-CM

## 2014-04-12 MED ORDER — GUAIFENESIN ER 600 MG PO TB12
600.0000 mg | ORAL_TABLET | Freq: Two times a day (BID) | ORAL | Status: DC
  Administered 2014-04-12: 600 mg via ORAL
  Filled 2014-04-12 (×3): qty 1

## 2014-04-12 MED ORDER — HYDROXYZINE HCL 25 MG PO TABS: 25.0000 mg | ORAL_TABLET | Freq: Three times a day (TID) | ORAL | Status: DC | PRN

## 2014-04-12 MED ORDER — HYDROXYZINE HCL 25 MG PO TABS
25.0000 mg | ORAL_TABLET | Freq: Once | ORAL | Status: AC
  Administered 2014-04-12: 25 mg via ORAL
  Filled 2014-04-12: qty 1

## 2014-04-12 NOTE — Consult Note (Signed)
[facility]   Subjective: Pt seen and chart reviewed. Pt reports that she misunderstood the taper ordered by her PCP and that she accidentally quit taking some of her medication without tapering it as directed. Pt reports that this caused a lot of anxiety to build up and that she reported to the emergency room. During this assessment, this NP reviewed the tapering instructions and medical records and discussed with pt that the Effexor taper ended yesterday and that she is to start Celexa Monday per the notes in her chart. Pt in agreement. Pt does report passive SI without plan reporting that she "feels overwhelmed by all this" but is in agreement that she can be safe if she goes home and her medications are managed properly. Pt in agreement to use Vistaril 25mg  tid PRN for anxiety in between her newly-initiated low-dose Klonopin in the event that her anxiety increases beyond what is comfortable for her. Pt contracts for safety at this time. Pt is also able to deny HI and AVH. Pt reports a good support system and agrees that this can be managed on an outpatient basis after spending 4 days in the ED. No evidence of psychosis objectively and no objective evidence presents for risk of self-harm during this assessment or in any ED staff notes over the past 4 days. Pt is calm and cooperative and ready for discharge. Pt may benefit from astute presentation of discharge medication instructions by nursing staff.   HPI:  Lauren Harmon is an 63 y.o. female that was referred for a tele assessment.  [facility] and gathered clinical information on the pt @ 1555 and tele assessment completed at 1600 by this clinician after scheduling with pt's nurse.  Pt presents to [facility] due to having suicidal ideation since last night.  Pt stated her PCP changed her psychiatric medications (Effexor and Xanax) to Celexa and Klonopin on Friday and pt didn't taper off of them correctly on accident.  Pt stated  she had a panic attack, "gor more scared" and started having thoughts of hurting herself.  Pt denies a current plan, but stated she was afraid of having these feelings, as she has never had them before.  Pt stated she has struggled with depression since the death of her boyfriend 3 years ago.  Pt stated, "I keep a lot in."  Pt unable to contract for safety at this time.  Pt stated she wants to "get my medicine straight."  Pt denies HI or AVH.  No delusions noted.  Pt endorses sx of depression and anxiety.  Pt reported that current stressors are not being able to go out of her home due to recent surgery on her leg for a stress fracture and ongoing bereavement issues.  Pt's son is her main support and he lives with her.  Pt is on disability.  Pt has no previous mental health treatment.  Pt denies SA.  Pt anxious, cooperative, oriented x 4, had logical/coherent thought processes and normal speech.    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  Axis II: Deferred Axis III:  Past Medical History  Diagnosis Date  . Anxiety   . H/O: knee surgery   . Hypertension   . Allergy   . Depression   . Asthma   . Osteoarthritis   . Palpitations   . Osteopenia   . Hemorrhoid   . Panic attack   .  Hypercholesteremia   . GERD (gastroesophageal reflux disease)    Axis IV: other psychosocial or environmental problems Axis V: 51-60 moderate symptoms  Psychiatric Specialty Exam: Physical Exam  ROS  Blood pressure 137/64, pulse 94, temperature 98.6 F (37 C), temperature source Oral, resp. rate 20, height 5\' 6"  (1.676 m), weight 126.554 kg (279 lb), last menstrual period 08/09/2002, SpO2 100 %.Body mass index is 45.05 kg/(m^2).  General Appearance: Casual and Fairly Groomed  Patent attorneyye Contact::  Good  Speech:  Clear and Coherent and Slow  Volume:  Normal  Mood:  Euthymic  Affect:  Appropriate and Congruent   Thought Process:  Coherent and Goal Directed  Orientation:  Full (Time, Place, and Person)  Thought Content:  WDL  Suicidal Thoughts:  No  Homicidal Thoughts:  No  Memory:  Immediate;   Fair Recent;   Fair Remote;   Fair  Judgement:  Fair  Insight:  Fair  Psychomotor Activity:  Normal  Concentration:  Good  Recall:  Good  Akathisia:  No  Handed:    AIMS (if indicated):     Assets:  Communication Skills Desire for Improvement Resilience  Sleep:         Past Medical History:  Past Medical History  Diagnosis Date  . Anxiety   . H/O: knee surgery   . Hypertension   . Allergy   . Depression   . Asthma   . Osteoarthritis   . Palpitations   . Osteopenia   . Hemorrhoid   . Panic attack   . Hypercholesteremia   . GERD (gastroesophageal reflux disease)     Past Surgical History  Procedure Laterality Date  . Eye surgery      Family History:  Family History  Problem Relation Age of Onset  . Osteoarthritis Mother   . Hypertension Mother   . Hypertension Father   . Hyperlipidemia Father   . Heart disease Father   . Osteoarthritis Father   . Cancer Father     colon  . Cancer Maternal Uncle     lung-smoker    Social History:  reports that she has never smoked. She does not have any smokeless tobacco history on file. She reports that she does not drink alcohol or use illicit drugs.  Additional Social History:  Alcohol / Drug Use Pain Medications: see med list Prescriptions: see med list Over the Counter: see med list History of alcohol / drug use?: No history of alcohol / drug abuse Longest period of sobriety (when/how long):  (na) Negative Consequences of Use:  (na) Withdrawal Symptoms:  (na)  CIWA: CIWA-Ar BP: 137/64 mmHg Pulse Rate: 94 COWS:    PATIENT STRENGTHS: (choose at least two) Ability for insight Average or above average intelligence Communication skills Financial means General fund of knowledge Motivation for  treatment/growth Supportive family/friends  Allergies:  Allergies  Allergen Reactions  . Wellbutrin [Bupropion Hcl] Nausea Only    Home Medications:  (Not in a hospital admission)  OB/GYN Status:  Patient's last menstrual period was 08/09/2002.  Plan: -Discharge home with outpatient resources. [facility] TTS to assist with followup with psychiatry/counseling. -Vistaril 25mg  tid PRN anxiety x 5 days #15   Beau FannyWITHROW, Ala Capri C, FNP-BC 04/12/2014 3:22 PM

## 2014-04-12 NOTE — BH Assessment (Signed)
Consulted with Claudette Headonrad Withrow who will reassess patient today to determine disposition.  Glorious PeachNajah Srinidhi Landers, MS, LCASA Assessment Counselor

## 2014-04-12 NOTE — Progress Notes (Signed)
Patient declined at [facility] due to multiple medical issues.  Will continue to seek placement.   Adelene AmasEdith Medlin, LCSW.  [phone]

## 2014-04-12 NOTE — ED Notes (Signed)
Pt states she feels like she is getting a "cold".  States she feels like she needs a breathing treatment.   Lungs sounds clear when auscultated.  Sats97%.  Pt no distress.

## 2014-04-12 NOTE — Discharge Instructions (Signed)
[facility]  Social worker will contact you to a arrange a follow-up appointment with a mental health provider. You are receiving a prescription for Vistaril which he can use with or instead of your Klonopin, as needed for anxiety.     Stress Stress-related medical problems are becoming increasingly common. The body has a built-in physical response to stressful situations. Faced with pressure, challenge or danger, we need to react quickly. Our bodies release hormones such as cortisol and adrenaline to help do this. These hormones are part of the "fight or flight" response and affect the metabolic rate, heart rate and blood pressure, resulting in a heightened, stressed state that prepares the body for optimum performance in dealing with a stressful situation. It is likely that early man required these mechanisms to stay alive, but usually modern stresses do not call for this, and the same hormones released in today's world can damage health and reduce coping ability. CAUSES  Pressure to perform at work, at school or in sports.  Threats of physical violence.  Money worries.  Arguments.  Family conflicts.  Divorce or separation from significant other.  Bereavement.  New job or unemployment.  Changes in location.  Alcohol or drug abuse. SOMETIMES, THERE IS NO PARTICULAR REASON FOR DEVELOPING STRESS. Almost all people are at risk of being stressed at some time in their lives. It is important to know that some stress is temporary and some is long term.  Temporary stress will go away when a situation is resolved. Most people can cope with short periods of stress, and it can often be relieved by relaxing, taking a walk or getting any type of exercise, chatting through issues with friends, or having a good night's sleep.  Chronic (long-term, continuous) stress is much harder to deal with. It can be psychologically and emotionally damaging. It can be harmful both for an individual and for  friends and family. SYMPTOMS Everyone reacts to stress differently. There are some common effects that help us recognize it. In times of extreme stress, people may:  Shake uncontrollably.  Breathe faster and deeper than normal (hyperventilate).  Vomit.  For people with asthma, stress can trigger an attack.  For some people, stress may trigger migraine headaches, ulcers, and body pain. PHYSICAL EFFECTS OF STRESS MAY INCLUDE:  Loss of energy.  Skin problems.  Aches and pains resulting from tense muscles, including neck ache, backache and tension headaches.  Increased pain from arthritis and other conditions.  Irregular heart beat (palpitations).  Periods of irritability or anger.  Apathy or depression.  Anxiety (feeling uptight or worrying).  Unusual behavior.  Loss of appetite.  Comfort eating.  Lack of concentration.  Loss of, or decreased, sex-drive.  Increased smoking, drinking, or recreational drug use.  For women, missed periods.  Ulcers, joint pain, and muscle pain. Post-traumatic stress is the stress caused by any serious accident, strong emotional damage, or extremely difficult or violent experience such as rape or war. Post-traumatic stress victims can experience mixtures of emotions such as fear, shame, depression, guilt or anger. It may include recurrent memories or images that may be haunting. These feelings can last for weeks, months or even years after the traumatic event that triggered them. Specialized treatment, possibly with medicines and psychological therapies, is available. If stress is causing physical symptoms, severe distress or making it difficult for you to function as normal, it is worth seeing your caregiver. It is important to remember that although stress is a usual part of life,  extreme or prolonged stress can lead to other illnesses that will need treatment. It is better to visit a doctor sooner rather than later. Stress has been linked  to the development of high blood pressure and heart disease, as well as insomnia and depression. There is no diagnostic test for stress since everyone reacts to it differently. But a caregiver will be able to spot the physical symptoms, such as:  Headaches.  Shingles.  Ulcers. Emotional distress such as intense worry, low mood or irritability should be detected when the doctor asks pertinent questions to identify any underlying problems that might be the cause. In case there are physical reasons for the symptoms, the doctor may also want to do some tests to exclude certain conditions. If you feel that you are suffering from stress, try to identify the aspects of your life that are causing it. Sometimes you may not be able to change or avoid them, but even a small change can have a positive ripple effect. A simple lifestyle change can make all the difference. STRATEGIES THAT CAN HELP DEAL WITH STRESS:  Delegating or sharing responsibilities.  Avoiding confrontations.  Learning to be more assertive.  Regular exercise.  Avoid using alcohol or street drugs to cope.  Eating a healthy, balanced diet, rich in fruit and vegetables and proteins.  Finding humor or absurdity in stressful situations.  Never taking on more than you know you can handle comfortably.  Organizing your time better to get as much done as possible.  Talking to friends or family and sharing your thoughts and fears.  Listening to music or relaxation tapes.  Relaxation techniques like deep breathing, meditation, and yoga.  Tensing and then relaxing your muscles, starting at the toes and working up to the head and neck. If you think that you would benefit from help, either in identifying the things that are causing your stress or in learning techniques to help you relax, see a caregiver who is capable of helping you with this. Rather than relying on medications, it is usually better to try and identify the things in  your life that are causing stress and try to deal with them. There are many techniques of managing stress including counseling, psychotherapy, aromatherapy, yoga, and exercise. Your caregiver can help you determine what is best for you. Document Released: 07/23/2002 Document Revised: 05/07/2013 Document Reviewed: 06/19/2007 [facility] Patient Information 2015 [place], [place]. This information is not intended to replace advice given to you by your health care provider. Make sure you discuss any questions you have with your health care provider.  Social Anxiety Disorder Social anxiety disorder, previously called social phobia, is a mental disorder. People with social anxiety disorder frequently feel nervous, afraid, or embarrassed when around other people in social situations. They constantly worry that other people are judging or criticizing them for how they look, what they say, or how they act. They may worry that other people might reject them because of their appearance or behavior. Social anxiety disorder is more than just occasional shyness or self-consciousness. It can cause severe emotional distress. It can interfere with daily life activities. Social anxiety disorder also may lead to excessive alcohol or drug use and even suicide.  Social anxiety disorder is actually one of the most common mental disorders. It can develop at any time but usually starts in the teenage years. Women are more commonly affected than men. Social anxiety disorder is also more common in people who have family members with anxiety disorders. It  also is more common in people who have physical deformities or conditions with characteristics that are obvious to others, such as stuttered speech or movement abnormalities (Parkinson disease).  SYMPTOMS  In addition to feeling anxious or fearful in social situations, people with social anxiety disorder frequently have physical symptoms. Examples include:  Red face  (blushing).  Racing heart.  Sweating.  Shaky hands or voice.  Confusion.  Light-headedness.  Upset stomach and diarrhea. DIAGNOSIS  Social anxiety disorder is diagnosed through an assessment by your health care provider. Your health care provider will ask you questions about your mood, thoughts, and reactions in social situations. Your health care provider may ask you about your medical history and use of alcohol or drugs, including prescription medicines. Certain medical conditions and the use of certain substances, including caffeine, can cause symptoms similar to social anxiety disorder. Your health care provider may refer you to a mental health specialist for further evaluation or treatment. The criteria for diagnosis of social anxiety disorder are:  Marked fear or anxiety in one or more social situations in which you may be closely watched or studied by others. Examples of such situations include:  Interacting socially (having a conversation with others, going to a party, or meeting strangers).  Being observed (eating or drinking in public or being called on in class).  Performing in front of others (giving a speech).  The social situations of concern almost always cause fear or anxiety, not just occasionally.  People with social anxiety disorder fear that they will be viewed negatively in a way that will be embarrassing, will lead to rejection, or will offend others. This fear is out of proportion to the actual threat posed by the social situation.  Often the triggering social situations are avoided, or they are endured with intense fear or anxiety. The fear, anxiety, or avoidance is persistent and lasts for 6 months or longer.  The anxiety causes difficulty functioning in at least some parts of your daily life. TREATMENT  Several types of treatment are available for social anxiety disorder. These treatments are often used in combination and include:   Talk therapy. Group  talk therapy allows you to see that you are not alone with these problems. Individual talk therapy helps you address your specific anxiety issues with a caring professional. The most effective forms of talk therapy for social anxiety disorder are cognitive-behavioral therapy and exposure therapy. Cognitive-behavioral therapy helps you to identify and change negative thoughts and beliefs that are at the root of the disorder. Exposure therapy allows you to gradually face the situations that you fear most.  Relaxation and coping techniques. These include deep breathing, self-talk, meditation, visual imagery, and yoga. Relaxation techniques help to keep you calm in social situations.  Social Optician, dispensingskills training.Social skills can be learned on your own or with the help of a talk therapist. They can help you feel more confident and comfortable in social situations.  Medicine. For anxiety limited to performance situations (performance anxiety), medicine called beta blockers can help by reducing or preventing the physical symptoms of social anxiety disorder. For more persistent and generalized social anxiety, antidepressant medicine may be prescribed to help control symptoms. In severe cases of social anxiety disorder, strong antianxiety medicine, called benzodiazepines, may be prescribed on a limited basis and for a short time. Document Released: 03/31/2005 Document Revised: 09/16/2013 Document Reviewed: 07/31/2012 [facility] Patient Information 2015 [place]. This information is not intended to replace advice given to you by your health care  provider. Make sure you discuss any questions you have with your health care provider. ° °

## 2014-04-12 NOTE — ED Provider Notes (Signed)
17:00- she was seen once again, by tele-psychiatry.  They report that she has passive suicidal ideation, and that she is stable for discharge with outpatient management.  They plan on having social work contact the patient to arrange a follow-up appointment.  They would like her discharged with a prescription for Vistaril 25 mg #15 3 times a day when necessary anxiety.  Findings were discussed with the patient, she is agreeable with this discharge plan.  She does not express any additional concerns.  Flint MelterElliott L Takahiro Godinho, MD 04/12/14 [id number]

## 2014-04-14 ENCOUNTER — Telehealth: Payer: Self-pay | Admitting: Family Medicine

## 2014-04-15 NOTE — Telephone Encounter (Signed)
Stp she had questions regarding how to take her meds, reviewed meds and when to take them, pt was seen in the ER this past weekend for panic/anxiety attacks. Pt wanted to know if we could change her medications, advised pt she would need to be seen to discuss any medication changes as they were just changed. Pt states she will CB to schedule an appointment if she thinks she needs to.

## 2014-04-16 ENCOUNTER — Telehealth: Payer: Self-pay | Admitting: Family Medicine

## 2014-04-16 ENCOUNTER — Other Ambulatory Visit: Payer: Self-pay | Admitting: Nurse Practitioner

## 2014-04-16 NOTE — Telephone Encounter (Signed)
Patient advised to double her dose and she will call us to let us know if it helped. Patient verbalized understanding.

## 2014-04-16 NOTE — Telephone Encounter (Signed)
I would double the dose from 0.5-1 mg

## 2014-04-16 NOTE — Telephone Encounter (Signed)
Patient wants to know if you will increase her anxiety medication Klonopin. She states that she dies not fill llike it is working. She states she has no appetite and that she is having anxiety attacks. Please advise

## 2014-04-17 ENCOUNTER — Telehealth: Payer: Self-pay | Admitting: Family Medicine

## 2014-04-17 NOTE — Telephone Encounter (Signed)
Explained to pt Dr. Garen LahMillers recommendations and pt verbalized understanding.

## 2014-04-17 NOTE — Telephone Encounter (Signed)
done

## 2014-04-17 NOTE — Telephone Encounter (Signed)
See other note

## 2014-04-17 NOTE — Clinical Social Work Note (Signed)
CSW spoke with pt and [facility] for outpatient follow up. Pt aware.   Derenda FennelKara Venecia Mehl, [place] [phone]

## 2014-04-18 ENCOUNTER — Telehealth: Payer: Self-pay | Admitting: Family Medicine

## 2014-04-18 NOTE — Telephone Encounter (Signed)
Aware,script for phenergan was not denied.

## 2014-04-23 ENCOUNTER — Telehealth: Payer: Self-pay | Admitting: Family Medicine

## 2014-04-23 ENCOUNTER — Encounter: Payer: Self-pay | Admitting: *Deleted

## 2014-04-23 MED ORDER — CLONAZEPAM 1 MG PO TABS: 1.0000 mg | ORAL_TABLET | Freq: Two times a day (BID) | ORAL | Status: DC | PRN

## 2014-04-23 NOTE — Telephone Encounter (Signed)
Script for clonazepam 1 mg, take BID , qty 60 [facility] per Dr. Hyacinth MeekerMiller.

## 2014-04-28 ENCOUNTER — Telehealth: Payer: Self-pay | Admitting: Family Medicine

## 2014-04-28 NOTE — Telephone Encounter (Signed)
Patient wants to know if you can up her celexa. Please advise patient is aware you will not be back in office til Wednesday.

## 2014-04-30 ENCOUNTER — Other Ambulatory Visit: Payer: Self-pay | Admitting: Nurse Practitioner

## 2014-04-30 NOTE — Telephone Encounter (Signed)
Pt wants to increase Celexa Please advise

## 2014-05-01 NOTE — Telephone Encounter (Signed)
She has been called numerous times about this question

## 2014-05-02 NOTE — Telephone Encounter (Signed)
She may increase the Celexa to 40 mg a day it's 2 tablets at the same time. She should be reminded that it takes 2-4 weeks to feel the effect of a new medicine like this

## 2014-05-02 NOTE — Telephone Encounter (Signed)
Spoke with pt this morning about the celexa-   She feels like in the evenings she is getting depressed - she feels that the 20 mg is not strong enough - and is not lasting as long as it should.  Clonazepam works well  Could the celexa be increased?

## 2014-05-05 NOTE — Telephone Encounter (Signed)
Aware, can start 2 pills of medicine.

## 2014-05-07 ENCOUNTER — Encounter: Payer: Self-pay | Admitting: Family Medicine

## 2014-05-07 ENCOUNTER — Ambulatory Visit (INDEPENDENT_AMBULATORY_CARE_PROVIDER_SITE_OTHER): Payer: Medicare Other | Admitting: Family Medicine

## 2014-05-07 VITALS — BP 135/87 | HR 102 | Temp 98.4°F | Ht 66.0 in | Wt 279.0 lb

## 2014-05-07 DIAGNOSIS — R3589 Other polyuria: Secondary | ICD-10-CM

## 2014-05-07 DIAGNOSIS — E559 Vitamin D deficiency, unspecified: Secondary | ICD-10-CM

## 2014-05-07 DIAGNOSIS — R358 Other polyuria: Secondary | ICD-10-CM

## 2014-05-07 LAB — POCT GLYCOSYLATED HEMOGLOBIN (HGB A1C): Hemoglobin A1C: 5.6

## 2014-05-07 MED ORDER — CITALOPRAM HYDROBROMIDE 20 MG PO TABS: ORAL_TABLET | ORAL | Status: DC

## 2014-05-07 MED ORDER — VITAMIN D (ERGOCALCIFEROL) 1.25 MG (50000 UNIT) PO CAPS: 50000.0000 [IU] | ORAL_CAPSULE | ORAL | Status: DC

## 2014-05-07 NOTE — Progress Notes (Signed)
Subjective:    Patient ID: Lauren Harmon, female    DOB: 09/20/1950, 63 y.o.   MRN: [id number]  HPI 63 year old female here for follow-up anxiety depression. [facility] and has been on some sort of bone strengthening device but was recently started on therapeutic doses of vitamin D because CT scan suggested she had osteoporosis. This is surprising given the fact that she is not underweight and that she is African-American.  We talked in general about her nerve medicines. She has called back several times since her last visit here trying to understand how I wanted her to take the medicines. Basically we tapered her off of the Effexor and started citalopram. At the same time we started Klonopin in favor of alprazolam. Now the doses should be as follows: Klonopin 1 mg twice a day and 1/2 mg when necessary anxiety. Citalopram 20 mg 1 at lunch and one at bedtime or 40 mg per day.  She is also complaining today of polyuria. She denies any Disch area. I would be concerned that she might have some diabetes.          Patient Active Problem List   Diagnosis Date Noted  . Anxiety   . GAD (generalized anxiety disorder) 02/11/2013  . Hypertension 09/21/2010  . Hyperlipidemia 09/21/2010  . Osteoarthritis 09/21/2010  . Depression 09/21/2010  . Asthma 09/21/2010  . Obesity 09/21/2010  . Osteopenia 09/21/2010  . Hemorrhoids 09/21/2010  . Lumbar disc disease 09/21/2010  . Esophageal stricture 09/21/2010   Outpatient Encounter Prescriptions as of 05/07/2014  Medication Sig  . amLODipine-benazepril (LOTREL) 5-20 MG per capsule TAKE 1 CAPSULE BY MOUTH DAILY.  Marland Kitchen. aspirin EC 81 MG tablet Take 81 mg by mouth every morning.   Marland Kitchen. atorvastatin (LIPITOR) 40 MG tablet Take 1 tablet (40 mg total) by mouth daily. (Patient taking differently: Take 40 mg by mouth every morning. )  . citalopram (CELEXA) 20 MG tablet Take 1 tablet (20 mg total) by mouth daily.  . clonazePAM (KLONOPIN) 1 MG  tablet Take 1 tablet (1 mg total) by mouth 2 (two) times daily as needed for anxiety.  . cyclobenzaprine (FLEXERIL) 10 MG tablet Take 10 mg by mouth every morning.   . docusate sodium (COLACE) 100 MG capsule Take 100 mg by mouth every other day as needed for mild constipation.  Marland Kitchen. esomeprazole (NEXIUM) 40 MG capsule Take 1 capsule (40 mg total) by mouth daily.  . furosemide (LASIX) 20 MG tablet Take 1 tablet (20 mg total) by mouth 2 (two) times daily. (Patient taking differently: Take 20 mg by mouth every other day. )  . HYDROcodone-acetaminophen (NORCO) 10-325 MG per tablet Take 1 tablet by mouth 2 (two) times daily.   . hydrOXYzine (ATARAX/VISTARIL) 25 MG tablet Take 1 tablet (25 mg total) by mouth every 8 (eight) hours as needed for anxiety.  . meloxicam (MOBIC) 15 MG tablet TAKE 1 TABLET (15 MG TOTAL) BY MOUTH DAILY.  . montelukast (SINGULAIR) 10 MG tablet TAKE 1 TABLET (10 MG TOTAL) BY MOUTH AT BEDTIME.  . promethazine (PHENERGAN) 12.5 MG tablet TAKE 1 TABLET (12.5 MG TOTAL) BY MOUTH EVERY 6 (EIGHT) HOURS AS NEEDED FOR NAUSEA.  . Vitamin D, Ergocalciferol, (DRISDOL) 50000 UNITS CAPS capsule Take 50,000 Units by mouth every Tuesday.   . [DISCONTINUED] ALPRAZolam (XANAX) 1 MG tablet Take 1 mg by mouth every morning.   . [DISCONTINUED] clonazePAM (KLONOPIN) 0.5 MG tablet   . [DISCONTINUED] venlafaxine XR (EFFEXOR-XR) 150 MG  24 hr capsule Take 150 mg by mouth every other day.      Review of Systems  Constitutional: Negative.   HENT: Negative.   Respiratory: Negative.   Cardiovascular: Negative.   Gastrointestinal: Negative.   Endocrine: Positive for polyuria.       Objective:   Physical Exam  Constitutional: She is oriented to person, place, and time. She appears well-developed and well-nourished.  Eyes: Pupils are equal, round, and reactive to light.  Cardiovascular: Normal rate and regular rhythm.   Pulmonary/Chest: Effort normal.  Musculoskeletal:  Seated in wheelchair. [facility] early next month  Neurological: She is alert and oriented to person, place, and time.   BP 135/87 mmHg  Pulse 102  Temp(Src) 98.4 F (36.9 C) (Oral)  Ht 5\' 6"  (1.676 m)  Wt 279 lb (126.554 kg)  BMI 45.05 kg/m2  LMP 08/09/2002        Assessment & Plan:  1. Polyuria  - POCT glycosylated hemoglobin (Hb A1C)  2. Hypovitaminosis D  - Vit D  25 hydroxy (rtn osteoporosis monitoring)  Frederica KusterStephen M Leslieann Whisman MD

## 2014-05-08 LAB — VITAMIN D 25 HYDROXY (VIT D DEFICIENCY, FRACTURES): VIT D 25 HYDROXY: 41.1 ng/mL (ref 30.0–100.0)

## 2014-05-11 ENCOUNTER — Other Ambulatory Visit: Payer: Self-pay | Admitting: Nurse Practitioner

## 2014-05-14 ENCOUNTER — Other Ambulatory Visit: Payer: Self-pay | Admitting: Nurse Practitioner

## 2014-05-14 NOTE — Telephone Encounter (Signed)
Last seen 05/07/14 Dr Hyacinth MeekerMiller

## 2014-05-16 ENCOUNTER — Other Ambulatory Visit: Payer: Self-pay | Admitting: Family Medicine

## 2014-05-20 ENCOUNTER — Other Ambulatory Visit: Payer: Self-pay

## 2014-05-20 NOTE — Telephone Encounter (Signed)
Last seen 05/07/14 Dr Hyacinth MeekerMiller   If approved route to nursing pool [facility]

## 2014-05-21 ENCOUNTER — Other Ambulatory Visit: Payer: Self-pay

## 2014-05-21 DIAGNOSIS — Z967 Presence of other bone and tendon implants: Secondary | ICD-10-CM | POA: Diagnosis not present

## 2014-05-21 DIAGNOSIS — Z743 Need for continuous supervision: Secondary | ICD-10-CM | POA: Diagnosis not present

## 2014-05-21 DIAGNOSIS — R531 Weakness: Secondary | ICD-10-CM | POA: Diagnosis not present

## 2014-05-21 DIAGNOSIS — S72431K Displaced fracture of medial condyle of right femur, subsequent encounter for closed fracture with nonunion: Secondary | ICD-10-CM | POA: Diagnosis not present

## 2014-05-21 DIAGNOSIS — M818 Other osteoporosis without current pathological fracture: Secondary | ICD-10-CM | POA: Diagnosis not present

## 2014-05-21 DIAGNOSIS — R404 Transient alteration of awareness: Secondary | ICD-10-CM | POA: Diagnosis not present

## 2014-05-21 DIAGNOSIS — S72421K Displaced fracture of lateral condyle of right femur, subsequent encounter for closed fracture with nonunion: Secondary | ICD-10-CM | POA: Diagnosis not present

## 2014-05-21 DIAGNOSIS — R279 Unspecified lack of coordination: Secondary | ICD-10-CM | POA: Diagnosis not present

## 2014-05-21 DIAGNOSIS — Z8781 Personal history of (healed) traumatic fracture: Secondary | ICD-10-CM | POA: Diagnosis not present

## 2014-05-21 DIAGNOSIS — R0902 Hypoxemia: Secondary | ICD-10-CM | POA: Diagnosis not present

## 2014-05-21 NOTE — Telephone Encounter (Signed)
Lasts seen 05/07/14 DR Hyacinth MeekerMiller  If approved route to nurse pool [facility]

## 2014-05-22 ENCOUNTER — Telehealth: Payer: Self-pay | Admitting: Family Medicine

## 2014-05-22 MED ORDER — CLONAZEPAM 1 MG PO TABS: 1.0000 mg | ORAL_TABLET | Freq: Two times a day (BID) | ORAL | Status: DC | PRN

## 2014-05-22 NOTE — Telephone Encounter (Signed)
Please call in klonopin 1mg 1 po BID #60 with 0 refills  

## 2014-05-22 NOTE — Telephone Encounter (Signed)
[facility] for [facility]ep[facility] Pt notified

## 2014-05-22 NOTE — Telephone Encounter (Signed)
Please review and advise.

## 2014-05-25 ENCOUNTER — Other Ambulatory Visit: Payer: Self-pay | Admitting: Nurse Practitioner

## 2014-05-27 ENCOUNTER — Encounter: Payer: Self-pay | Admitting: Gastroenterology

## 2014-06-03 ENCOUNTER — Ambulatory Visit (INDEPENDENT_AMBULATORY_CARE_PROVIDER_SITE_OTHER): Payer: Medicare Other | Admitting: Internal Medicine

## 2014-06-03 ENCOUNTER — Encounter: Payer: Self-pay | Admitting: *Deleted

## 2014-06-03 ENCOUNTER — Encounter: Payer: Self-pay | Admitting: Internal Medicine

## 2014-06-03 VITALS — BP 136/80 | HR 81 | Ht 66.0 in | Wt 273.0 lb

## 2014-06-03 DIAGNOSIS — F329 Major depressive disorder, single episode, unspecified: Secondary | ICD-10-CM | POA: Diagnosis not present

## 2014-06-03 DIAGNOSIS — R1310 Dysphagia, unspecified: Secondary | ICD-10-CM

## 2014-06-03 DIAGNOSIS — F411 Generalized anxiety disorder: Secondary | ICD-10-CM

## 2014-06-03 DIAGNOSIS — K589 Irritable bowel syndrome without diarrhea: Secondary | ICD-10-CM | POA: Diagnosis not present

## 2014-06-03 DIAGNOSIS — F32A Depression, unspecified: Secondary | ICD-10-CM

## 2014-06-03 MED ORDER — DICYCLOMINE HCL 20 MG PO TABS: 20.0000 mg | ORAL_TABLET | Freq: Every day | ORAL | Status: DC

## 2014-06-03 NOTE — Assessment & Plan Note (Signed)
Continue Tx Some occasional thoughts of death but not suicidal at all  Advised to mention this to PCP

## 2014-06-03 NOTE — Assessment & Plan Note (Signed)
Advised she needs to give meds time to work

## 2014-06-03 NOTE — Progress Notes (Signed)
   Subjective:    Patient ID: Lauren Harmon, female    DOB: 07/09/1950, 64 y.o.   MRN: [id number]  HPI   Middle-aged woman here with following complaints.  Dysphagia to solids and pills - mid sternal stick relieved by drinking and occasional neck dysphagia with pills  Hx of esophageal dilation 20 yrs ago  No heartburn  Nervous stomach, not nausea, periumbilical sensation awakens her at 4 AM - then defecates and is ok  transitioning from Effexor and alprazolam to Celexa and clonazepam  Medications, allergies, past medical history, past surgical history, family history and social history are reviewed and updated in the EMR.  Review of Systems Wheelchair bound after knee surgery As above otherwise    Objective:   Physical Exam Obese in W/C NAD BP 136/80 mmHg  Pulse 81  Ht 5\' 6"  (1.676 m)  Wt 273 lb (123.832 kg)  BMI 44.08 kg/m2  SpO2 97%  LMP 08/09/2002 Lungs CTA Cor S1S2 no rmg abd obese soft and NT Ext no edema A and o x 3  Mood appropriate  Data:  Normal colonoscopy 2008      Assessment & Plan:  Dysphagia - Plan: DG Esophagus  IBS (irritable bowel syndrome)  GAD (generalized anxiety disorder)  Depression  1. Evaluate dysphagia with ba swallow first - then possible EGD 2. Dicyclomine for IBS 3. Continue working with PCP on Anxiety/depression  I appreciate the opportunity to care for this patient. WU:[id number]:MILLER, Bertram MillardSTEPHEN M, MD

## 2014-06-03 NOTE — Patient Instructions (Addendum)
We have sent the following medications to your pharmacy for you to pick up at your convenience: Dicyclomine   You have been scheduled for a [facility] (1st floor of the hospital) on 06/12/14 at 10:30am. Please arrive 15 minutes prior to your appointment for registration. Make certain not to have anything to eat or drink 3 hours prior to your test. If you need to reschedule for any reason, please contact radiology at [phone] to do so. __________________________________________________________________ A barium swallow is an examination that concentrates on views of the esophagus. This tends to be a double contrast exam (barium and two liquids which, when combined, create a gas to distend the wall of the oesophagus) or single contrast (non-ionic iodine based). The study is usually tailored to your symptoms so a good history is essential. Attention is paid during the study to the form, structure and configuration of the esophagus, looking for functional disorders (such as aspiration, dysphagia, achalasia, motility and reflux) EXAMINATION You may be asked to change into a gown, depending on the type of swallow being performed. A radiologist and radiographer will perform the procedure. The radiologist will advise you of the type of contrast selected for your procedure and direct you during the exam. You will be asked to stand, sit or lie in several different positions and to hold a small amount of fluid in your mouth before being asked to swallow while the imaging is performed .In some instances you may be asked to swallow barium coated marshmallows to assess the motility of a solid food bolus. The exam can be recorded as a digital or video fluoroscopy procedure. POST PROCEDURE It will take 1-2 days for the barium to pass through your system. To facilitate this, it is important, unless otherwise directed, to increase your fluids for the next 24-48hrs and to resume your normal  diet.  This test typically takes about 30 minutes to perform. __________________________________________________________________________________  I appreciate the opportunity to care for you. Stan Headarl Gessner, M.D., [facility]

## 2014-06-11 ENCOUNTER — Encounter: Payer: Self-pay | Admitting: Family

## 2014-06-11 ENCOUNTER — Ambulatory Visit (INDEPENDENT_AMBULATORY_CARE_PROVIDER_SITE_OTHER): Payer: Medicare Other | Admitting: Family

## 2014-06-11 VITALS — BP 137/87 | HR 91 | Temp 97.6°F | Ht 66.0 in | Wt 296.6 lb

## 2014-06-11 DIAGNOSIS — N390 Urinary tract infection, site not specified: Secondary | ICD-10-CM

## 2014-06-11 DIAGNOSIS — R339 Retention of urine, unspecified: Secondary | ICD-10-CM

## 2014-06-11 LAB — POCT URINALYSIS DIPSTICK
BILIRUBIN UA: NEGATIVE
Blood, UA: NEGATIVE
Glucose, UA: NEGATIVE
Ketones, UA: NEGATIVE
Leukocytes, UA: NEGATIVE
NITRITE UA: NEGATIVE
PH UA: 7
PROTEIN UA: NEGATIVE
Spec Grav, UA: <=1.005
Urobilinogen, UA: NEGATIVE

## 2014-06-11 LAB — POCT UA - MICROSCOPIC ONLY
CASTS, UR, LPF, POC: NEGATIVE
CRYSTALS, UR, HPF, POC: NEGATIVE
MUCUS UA: NEGATIVE
RBC, urine, microscopic: NEGATIVE
Yeast, UA: NEGATIVE

## 2014-06-11 MED ORDER — SULFAMETHOXAZOLE-TRIMETHOPRIM 800-160 MG PO TABS: 1.0000 | ORAL_TABLET | Freq: Two times a day (BID) | ORAL | Status: DC

## 2014-06-11 NOTE — Patient Instructions (Signed)

## 2014-06-11 NOTE — Progress Notes (Signed)
   Subjective:    Patient ID: Lauren Harmon, female    DOB: 08/29/1950, 64 y.o.   MRN: [id number]  Dysuria  This is a new problem. The current episode started 1 to 4 weeks ago. The problem occurs intermittently. The problem has been waxing and waning. Associated symptoms include frequency, hesitancy and urgency. Pertinent negatives include no discharge, flank pain, hematuria, nausea or vomiting. She has tried increased fluids for the symptoms. The treatment provided mild relief.    Review of Systems  Constitutional: Negative.   HENT: Negative.   Eyes: Negative.   Respiratory: Negative.  Negative for shortness of breath.   Cardiovascular: Negative.  Negative for palpitations.  Gastrointestinal: Negative.  Negative for nausea and vomiting.  Endocrine: Negative.   Genitourinary: Positive for dysuria, hesitancy, urgency and frequency. Negative for hematuria and flank pain.  Musculoskeletal: Negative.   Neurological: Negative.  Negative for headaches.  Hematological: Negative.   Psychiatric/Behavioral: Negative.   All other systems reviewed and are negative.      Objective:   Physical Exam  Constitutional: She is oriented to person, place, and time. She appears well-developed and well-nourished. No distress.  Cardiovascular: Normal rate, regular rhythm, normal heart sounds and intact distal pulses.   No murmur heard. Pulmonary/Chest: Effort normal and breath sounds normal. No respiratory distress. She has no wheezes.  Abdominal: Soft. Bowel sounds are normal. She exhibits no distension. There is no tenderness.  Musculoskeletal: Normal range of motion. She exhibits no edema or tenderness.  Negative CVA tenderness  Neurological: She is alert and oriented to person, place, and time. She has normal reflexes. No cranial nerve deficit.  Skin: Skin is warm and dry.  Psychiatric: She has a normal mood and affect. Her behavior is normal. Judgment and thought content normal.  Vitals  reviewed.   BP 137/87 mmHg  Pulse 91  Temp(Src) 97.6 F (36.4 C) (Oral)  Ht 5\' 6"  (1.676 m)  Wt 296 lb 9.6 oz (134.537 kg)  BMI 47.90 kg/m2  LMP 08/09/2002       Assessment & Plan:  1. Urinary retention - POCT UA - Microscopic Only - POCT urinalysis dipstick  2. Urinary tract infection without hematuria, site unspecified -Force fluids AZO over the counter X2 days RTO prn - sulfamethoxazole-trimethoprim (BACTRIM DS,SEPTRA DS) 800-160 MG per tablet; Take 1 tablet by mouth 2 (two) times daily.  Dispense: 10 tablet; Refill: 0  Jannifer Rodneyhristy Riggin Cuttino, FNP

## 2014-06-12 ENCOUNTER — Ambulatory Visit (HOSPITAL_COMMUNITY)
Admission: RE | Admit: 2014-06-12 | Discharge: 2014-06-12 | Disposition: A | Discharge status: Home / Self Care | Payer: Medicare Other | Source: Ambulatory Visit | Attending: Internal Medicine | Admitting: Internal Medicine

## 2014-06-12 DIAGNOSIS — F458 Other somatoform disorders: Secondary | ICD-10-CM | POA: Insufficient documentation

## 2014-06-12 DIAGNOSIS — R1310 Dysphagia, unspecified: Secondary | ICD-10-CM

## 2014-06-12 DIAGNOSIS — K222 Esophageal obstruction: Secondary | ICD-10-CM | POA: Diagnosis not present

## 2014-06-12 NOTE — Progress Notes (Signed)
Quick Note:  Either esophageal stricture or possibly achalasia Needs EGD with possible dilation or possible Botox at hospital  MAC or moderate ok   ______

## 2014-06-13 ENCOUNTER — Other Ambulatory Visit: Payer: Self-pay

## 2014-06-13 DIAGNOSIS — K222 Esophageal obstruction: Secondary | ICD-10-CM

## 2014-06-18 ENCOUNTER — Telehealth: Payer: Self-pay | Admitting: Family Medicine

## 2014-06-19 ENCOUNTER — Telehealth: Payer: Self-pay | Admitting: *Deleted

## 2014-06-19 ENCOUNTER — Encounter (HOSPITAL_COMMUNITY): Payer: Self-pay | Admitting: *Deleted

## 2014-06-19 ENCOUNTER — Other Ambulatory Visit: Payer: Self-pay

## 2014-06-19 ENCOUNTER — Emergency Department (HOSPITAL_COMMUNITY)
Admission: EM | Admit: 2014-06-19 | Discharge: 2014-06-20 | Disposition: A | Discharge status: Home / Self Care | Payer: Medicare Other | Attending: Emergency Medicine | Admitting: Emergency Medicine

## 2014-06-19 DIAGNOSIS — R45851 Suicidal ideations: Secondary | ICD-10-CM | POA: Diagnosis not present

## 2014-06-19 DIAGNOSIS — F32A Depression, unspecified: Secondary | ICD-10-CM

## 2014-06-19 DIAGNOSIS — Z79899 Other long term (current) drug therapy: Secondary | ICD-10-CM | POA: Insufficient documentation

## 2014-06-19 DIAGNOSIS — Z008 Encounter for other general examination: Secondary | ICD-10-CM | POA: Diagnosis present

## 2014-06-19 DIAGNOSIS — Z7982 Long term (current) use of aspirin: Secondary | ICD-10-CM | POA: Insufficient documentation

## 2014-06-19 DIAGNOSIS — F329 Major depressive disorder, single episode, unspecified: Secondary | ICD-10-CM | POA: Insufficient documentation

## 2014-06-19 DIAGNOSIS — J45909 Unspecified asthma, uncomplicated: Secondary | ICD-10-CM | POA: Diagnosis not present

## 2014-06-19 DIAGNOSIS — E780 Pure hypercholesterolemia: Secondary | ICD-10-CM | POA: Insufficient documentation

## 2014-06-19 DIAGNOSIS — K219 Gastro-esophageal reflux disease without esophagitis: Secondary | ICD-10-CM | POA: Diagnosis not present

## 2014-06-19 DIAGNOSIS — Z791 Long term (current) use of non-steroidal anti-inflammatories (NSAID): Secondary | ICD-10-CM | POA: Diagnosis not present

## 2014-06-19 DIAGNOSIS — Z8719 Personal history of other diseases of the digestive system: Secondary | ICD-10-CM | POA: Insufficient documentation

## 2014-06-19 DIAGNOSIS — M1990 Unspecified osteoarthritis, unspecified site: Secondary | ICD-10-CM | POA: Diagnosis not present

## 2014-06-19 DIAGNOSIS — Z792 Long term (current) use of antibiotics: Secondary | ICD-10-CM | POA: Diagnosis not present

## 2014-06-19 DIAGNOSIS — F410 Panic disorder [episodic paroxysmal anxiety] without agoraphobia: Secondary | ICD-10-CM | POA: Insufficient documentation

## 2014-06-19 DIAGNOSIS — I10 Essential (primary) hypertension: Secondary | ICD-10-CM | POA: Diagnosis not present

## 2014-06-19 LAB — URINALYSIS, ROUTINE W REFLEX MICROSCOPIC
Bilirubin Urine: NEGATIVE
GLUCOSE, UA: NEGATIVE mg/dL
Hgb urine dipstick: NEGATIVE
KETONES UR: NEGATIVE mg/dL
LEUKOCYTES UA: NEGATIVE
NITRITE: NEGATIVE
PH: 5.5 (ref 5.0–8.0)
Protein, ur: NEGATIVE mg/dL
Specific Gravity, Urine: 1.01 (ref 1.005–1.030)
Urobilinogen, UA: 0.2 mg/dL (ref 0.0–1.0)

## 2014-06-19 LAB — CBC WITH DIFFERENTIAL/PLATELET
BASOS ABS: 0 10*3/uL (ref 0.0–0.1)
Basophils Relative: 0 % (ref 0–1)
EOS PCT: 1 % (ref 0–5)
Eosinophils Absolute: 0 10*3/uL (ref 0.0–0.7)
HEMATOCRIT: 39.2 % (ref 36.0–46.0)
HEMOGLOBIN: 12.7 g/dL (ref 12.0–15.0)
LYMPHS PCT: 29 % (ref 12–46)
Lymphs Abs: 1.9 10*3/uL (ref 0.7–4.0)
MCH: 28.3 pg (ref 26.0–34.0)
MCHC: 32.4 g/dL (ref 30.0–36.0)
MCV: 87.5 fL (ref 78.0–100.0)
MONOS PCT: 11 % (ref 3–12)
Monocytes Absolute: 0.7 10*3/uL (ref 0.1–1.0)
NEUTROS ABS: 3.8 10*3/uL (ref 1.7–7.7)
Neutrophils Relative %: 59 % (ref 43–77)
Platelets: 211 10*3/uL (ref 150–400)
RBC: 4.48 MIL/uL (ref 3.87–5.11)
RDW: 14.3 % (ref 11.5–15.5)
WBC: 6.4 10*3/uL (ref 4.0–10.5)

## 2014-06-19 LAB — RAPID URINE DRUG SCREEN, HOSP PERFORMED
Amphetamines: NOT DETECTED
Barbiturates: NOT DETECTED
Benzodiazepines: NOT DETECTED
COCAINE: NOT DETECTED
Opiates: POSITIVE — AB
Tetrahydrocannabinol: NOT DETECTED

## 2014-06-19 LAB — BASIC METABOLIC PANEL
Anion gap: 4 — ABNORMAL LOW (ref 5–15)
BUN: 10 mg/dL (ref 6–23)
CO2: 28 mmol/L (ref 19–32)
CREATININE: 0.9 mg/dL (ref 0.50–1.10)
Calcium: 8.8 mg/dL (ref 8.4–10.5)
Chloride: 98 mmol/L (ref 96–112)
GFR, EST AFRICAN AMERICAN: 77 mL/min — AB (ref >90)
GFR, EST NON AFRICAN AMERICAN: 67 mL/min — AB (ref >90)
GLUCOSE: 106 mg/dL — AB (ref 70–99)
Potassium: 3.7 mmol/L (ref 3.5–5.1)
Sodium: 130 mmol/L — ABNORMAL LOW (ref 135–145)

## 2014-06-19 LAB — ETHANOL: Alcohol, Ethyl (B): <5 mg/dL (ref 0–9)

## 2014-06-19 NOTE — BH Assessment (Signed)
Tele Assessment Note   Lauren Harmon is a 64 y.o. female who voluntarily presents to [facility] at the request of her family practitioner.  Pt states she recently had medication change x49month ago---increased celexa meds from  - .  Pt reports since the med adjustment, she's had SI thoughts x1 month; no plan to harm herself.  Pt has no past hx of SI attempts, no past inpt hospitalizations.  Pt denies outpatient services at this time because of insurance issues.  Pt reports increased mood swings and anhedonia, stating--"I just don't feel good since the med change".  Pt contracted for safety and wants help with adjusting her medications.  This Clinical research associate discussed disposition with Janann August, NP and she agreed to with safety contract, to d/c patient with outpatient resources.    Axis I: Anxiety Disorder NOS and Depressive Disorder NOS Axis II: Deferred Axis III:  Past Medical History  Diagnosis Date  . Anxiety   . H/O: knee surgery   . Hypertension   . Allergy   . Depression   . Asthma   . Osteoarthritis   . Palpitations   . Osteopenia   . Hemorrhoid   . Panic attack   . Hypercholesteremia   . GERD (gastroesophageal reflux disease)    Axis IV: other psychosocial or environmental problems, problems related to social environment and problems with primary support group Axis V: 31-40 impairment in reality testing  Past Medical History:  Past Medical History  Diagnosis Date  . Anxiety   . H/O: knee surgery   . Hypertension   . Allergy   . Depression   . Asthma   . Osteoarthritis   . Palpitations   . Osteopenia   . Hemorrhoid   . Panic attack   . Hypercholesteremia   . GERD (gastroesophageal reflux disease)     Past Surgical History  Procedure Laterality Date  . Eye surgery    . Knee surgery      Family History:  Family History  Problem Relation Age of Onset  . Osteoarthritis Mother   . Hypertension Mother   . Hypertension Father   . Hyperlipidemia Father   . Heart  disease Father   . Osteoarthritis Father   . Colon cancer Father   . Lung cancer Maternal Uncle     smoker    Social History:  reports that she has never smoked. She does not have any smokeless tobacco history on file. She reports that she does not drink alcohol or use illicit drugs.  Additional Social History:  Alcohol / Drug Use Pain Medications: See MAR  Prescriptions: See MAR  Over the Counter: See MAR  History of alcohol / drug use?: No history of alcohol / drug abuse Longest period of sobriety (when/how long): None   CIWA: CIWA-Ar BP: 156/72 mmHg Pulse Rate: 75 COWS:    PATIENT STRENGTHS: (choose at least two) Communication skills Motivation for treatment/growth  Allergies:  Allergies  Allergen Reactions  . Wellbutrin [Bupropion Hcl] Nausea Only    Home Medications:  (Not in a hospital admission)  OB/GYN Status:  Patient's last menstrual period was 08/09/2002.  General Assessment Data Location of Assessment: [facility] Is this a Tele or Face-to-Face Assessment?: Tele Assessment Is this an Initial Assessment or a Re-assessment for this encounter?: Initial Assessment Living Arrangements: Children (Son lives with pt ) Can pt return to current living arrangement?: Yes Admission Status: Voluntary Is patient capable of signing voluntary admission?: Yes Transfer from: Home Referral Source: Self/Family/Friend  Medical Screening Exam [facility] Walk-in ONLY) Medical Exam completed: No Reason for MSE not completed: Other: (None )  [facility] Crisis Care Plan Living Arrangements: Children (Son lives with pt ) Name of Psychiatrist: None  Name of Therapist: None   Education Status Is patient currently in school?: No Current Grade: None  Highest grade of school patient has completed: None  Name of school: None  Contact person: None   Risk to self with the past 6 months Suicidal Ideation: Yes-Currently Present Suicidal Intent: No Is patient at risk for suicide?: No Suicidal  Plan?: No Access to Means: Yes Specify Access to Suicidal Means: Medications  What has been your use of drugs/alcohol within the last 12 months?: Denies  Previous Attempts/Gestures: No How many times?: 0 Other Self Harm Risks: None  Triggers for Past Attempts: None known Intentional Self Injurious Behavior: None Recent stressful life event(s): Other (Comment) (Medication change x1 month ) Persecutory voices/beliefs?: No Depression: Yes Depression Symptoms: Loss of interest in usual pleasures Substance abuse history and/or treatment for substance abuse?: No Suicide prevention information given to non-admitted patients: Not applicable  Risk to Others within the past 6 months Homicidal Ideation: No Thoughts of Harm to Others: No Current Homicidal Intent: No Current Homicidal Plan: No Access to Homicidal Means: No Identified Victim: None  History of harm to others?: No Assessment of Violence: None Noted Violent Behavior Description: None  Does patient have access to weapons?: No Criminal Charges Pending?: No Does patient have a court date: No  Psychosis Hallucinations: None noted Delusions: None noted  Mental Status Report Appear/Hygiene: In scrubs Eye Contact: Good Motor Activity: Unremarkable Speech: Logical/coherent Level of Consciousness: Alert Mood: Depressed Affect: Depressed Anxiety Level: Minimal Thought Processes: Coherent, Relevant Judgement: Partial Orientation: Person, Place, Time, Situation Obsessive Compulsive Thoughts/Behaviors: None  Cognitive Functioning Concentration: Normal Memory: Recent Intact, Remote Intact IQ: Average Insight: Poor Impulse Control: Good Appetite: Good Weight Loss: 0 Weight Gain: 0 Sleep: No Change Total Hours of Sleep: 7 Vegetative Symptoms: None  ADLScreening [facility] Assessment Services) Patient's cognitive ability adequate to safely complete daily activities?: Yes Patient able to express need for assistance with ADLs?:  Yes Independently performs ADLs?: Yes (appropriate for developmental age)  Prior Inpatient Therapy Prior Inpatient Therapy: No Prior Therapy Dates: None  Prior Therapy Facilty/Provider(s): None  Reason for Treatment: None   Prior Outpatient Therapy Prior Outpatient Therapy: No Prior Therapy Dates: None  Prior Therapy Facilty/Provider(s): None  Reason for Treatment: None   ADL Screening (condition at time of admission) Patient's cognitive ability adequate to safely complete daily activities?: Yes Is the patient deaf or have difficulty hearing?: No Does the patient have difficulty seeing, even when wearing glasses/contacts?: No Does the patient have difficulty concentrating, remembering, or making decisions?: No Patient able to express need for assistance with ADLs?: Yes Does the patient have difficulty dressing or bathing?: No Independently performs ADLs?: Yes (appropriate for developmental age) Does the patient have difficulty walking or climbing stairs?: No Weakness of Legs: None Weakness of Arms/Hands: None  Home Assistive Devices/Equipment Home Assistive Devices/Equipment: None  Therapy Consults (therapy consults require a physician order) PT Evaluation Needed: No OT Evalulation Needed: No SLP Evaluation Needed: No Abuse/Neglect Assessment (Assessment to be complete while patient is alone) Physical Abuse: Denies Verbal Abuse: Denies Sexual Abuse: Denies Exploitation of patient/patient's resources: Denies Self-Neglect: Denies Values / Beliefs Cultural Requests During Hospitalization: None Spiritual Requests During Hospitalization: None Consults Spiritual Care Consult Needed: No Social Work Consult Needed: No Merchant navy officer (For  Healthcare) Does patient have an advance directive?: No Would patient like information on creating an advanced directive?: No - patient declined information    Additional Information 1:1 In Past 12 Months?: No CIRT Risk: No Elopement  Risk: No Does patient have medical clearance?: Yes     Disposition:  Disposition Initial Assessment Completed for this Encounter: Yes Disposition of Patient: Referred to (Per Janann Augustori Burkett, NP ) Patient referred to: Other (Comment) (Per Janann Augustori Burkett, NP )  Beatrix ShipperSimmons, [place] Voytko C 06/19/2014 10:42 PM

## 2014-06-19 NOTE — ED Notes (Signed)
Suicidal thoughts for 1 month.  Alert, cooperative.

## 2014-06-19 NOTE — ED Provider Notes (Signed)
Pt evaluated by TTS. Feel that appropriate for outpt FU. No ride until am. Plan to be discharged in morning.   Raeford RazorStephen Lekendrick Alpern, MD 06/19/14 [phone]

## 2014-06-19 NOTE — Telephone Encounter (Signed)
Last seen 06/11/14 Lauren BonitoChristy  If approved route [facility]

## 2014-06-19 NOTE — ED Notes (Addendum)
Pt has completed Telepsych assessment.  Per report, pt does not meet criteria for IP admission.  Recommended outpatient followup,

## 2014-06-19 NOTE — Telephone Encounter (Signed)
[facility] and [facility] @ 5:17 for evaluation.

## 2014-06-19 NOTE — ED Provider Notes (Signed)
CSN: [id number]     Arrival date & time 06/19/14  1723 History   First MD Initiated Contact with Patient 06/19/14 1925     Chief Complaint  Patient presents with  . V70.1    HPI Pt was seen at 2000. Per pt, c/o gradual onset and worsening of persistent depression and SI for the past 1 month. Pt states her PMD has been increasing her dose of celexa and klonopin without improvement in her symptoms. Pt endorses having generalized SI, but is without a plan. Denies HI, no hallucinations.    Past Medical History  Diagnosis Date  . Anxiety   . H/O: knee surgery   . Hypertension   . Allergy   . Depression   . Asthma   . Osteoarthritis   . Palpitations   . Osteopenia   . Hemorrhoid   . Panic attack   . Hypercholesteremia   . GERD (gastroesophageal reflux disease)    Past Surgical History  Procedure Laterality Date  . Eye surgery    . Knee surgery     Family History  Problem Relation Age of Onset  . Osteoarthritis Mother   . Hypertension Mother   . Hypertension Father   . Hyperlipidemia Father   . Heart disease Father   . Osteoarthritis Father   . Colon cancer Father   . Lung cancer Maternal Uncle     smoker   History  Substance Use Topics  . Smoking status: Never Smoker   . Smokeless tobacco: Not on file  . Alcohol Use: No    Review of Systems ROS: Statement: All systems negative except as marked or noted in the HPI; Constitutional: Negative for fever and chills. ; ; Eyes: Negative for eye pain, redness and discharge. ; ; ENMT: Negative for ear pain, hoarseness, nasal congestion, sinus pressure and sore throat. ; ; Cardiovascular: Negative for chest pain, palpitations, diaphoresis, dyspnea and peripheral edema. ; ; Respiratory: Negative for cough, wheezing and stridor. ; ; Gastrointestinal: Negative for nausea, vomiting, diarrhea, abdominal pain, blood in stool, hematemesis, jaundice and rectal bleeding. . ; ; Genitourinary: Negative for dysuria, flank pain and hematuria. ;  ; Musculoskeletal: Negative for back pain and neck pain. Negative for swelling and trauma.; ; Skin: Negative for pruritus, rash, abrasions, blisters, bruising and skin lesion.; ; Neuro: Negative for headache, lightheadedness and neck stiffness. Negative for weakness, altered level of consciousness , altered mental status, extremity weakness, paresthesias, involuntary movement, seizure and syncope; Psych:  +depression, +SI. No SA, no HI, no hallucinations.   Allergies  Wellbutrin  Home Medications   Prior to Admission medications   Medication Sig Start Date End Date Taking? Authorizing Provider  amLODipine-benazepril (LOTREL) 5-20 MG per capsule TAKE 1 CAPSULE BY MOUTH DAILY. 04/30/14  Yes Frederica Kuster, MD  aspirin EC 81 MG tablet Take 81 mg by mouth every morning.    Yes Historical Provider, MD  atorvastatin (LIPITOR) 40 MG tablet Take 1 tablet (40 mg total) by mouth daily. Patient taking differently: Take 40 mg by mouth every morning.  01/28/14  Yes Mary-Margaret [name], FNP  citalopram (CELEXA) 20 MG tablet Take 1 tab at lunch and 1 tab at bedtime 05/07/14  Yes Frederica Kuster, MD  clonazePAM (KLONOPIN) 1 MG tablet Take 1 tablet (1 mg total) by mouth 2 (two) times daily as needed for anxiety. Patient taking differently: Take 1 mg by mouth 2 (two) times daily as needed for anxiety. Am & 6pm 05/22/14  Yes Mary-Margaret [name],  FNP  cyclobenzaprine (FLEXERIL) 10 MG tablet Take 10 mg by mouth every morning.    Yes Historical Provider, MD  dicyclomine (BENTYL) 20 MG tablet Take 1 tablet (20 mg total) by mouth at bedtime. 06/03/14  Yes Iva Booparl E Gessner, MD  docusate sodium (COLACE) 100 MG capsule Take 100 mg by mouth daily as needed for mild constipation.    Yes Historical Provider, MD  esomeprazole (NEXIUM) 40 MG capsule Take 1 capsule (40 mg total) by mouth daily. 01/28/14  Yes Mary-Margaret Daphine DeutscherMartin, FNP  furosemide (LASIX) 20 MG tablet Take 1 tablet (20 mg total) by mouth 2 (two) times  daily. Patient taking differently: Take 20 mg by mouth every other day.  01/01/14  Yes Mary-Margaret Daphine DeutscherMartin, FNP  HYDROcodone-acetaminophen (NORCO) 10-325 MG per tablet Take 1 tablet by mouth 2 (two) times daily. Am & 2pm   Yes Historical Provider, MD  meloxicam (MOBIC) 15 MG tablet TAKE 1 TABLET (15 MG TOTAL) BY MOUTH DAILY. 04/30/14  Yes Frederica KusterStephen M Miller, MD  montelukast (SINGULAIR) 10 MG tablet TAKE 1 TABLET (10 MG TOTAL) BY MOUTH AT BEDTIME. 04/30/14  Yes Frederica KusterStephen M Miller, MD  NEXIUM 40 MG capsule TAKE 1 CAPSULE (40 MG TOTAL) BY MOUTH DAILY. Patient not taking: Reported on 06/19/2014 05/26/14   Frederica KusterStephen M Miller, MD  promethazine (PHENERGAN) 12.5 MG tablet TAKE 1 TABLET (12.5 MG TOTAL) BY MOUTH EVERY 6 (EIGHT) HOURS AS NEEDED FOR NAUSEA. Patient not taking: Reported on 06/19/2014 05/15/14   Frederica KusterStephen M Miller, MD  promethazine (PHENERGAN) 12.5 MG tablet TAKE 1 TABLET (12.5 MG TOTAL) BY MOUTH EVERY 6 (EIGHT) HOURS AS NEEDED FOR NAUSEA. Patient not taking: Reported on 06/19/2014 05/19/14   Frederica KusterStephen M Miller, MD  sulfamethoxazole-trimethoprim (BACTRIM DS,SEPTRA DS) 800-160 MG per tablet Take 1 tablet by mouth 2 (two) times daily. Patient not taking: Reported on 06/19/2014 06/11/14   Junie Spencerhristy A Hawks, FNP  Vitamin D, Ergocalciferol, (DRISDOL) 50000 UNITS CAPS capsule Take 1 capsule (50,000 Units total) by mouth every Tuesday. 05/07/14   Frederica KusterStephen M Miller, MD   BP 156/72 mmHg  Pulse 75  Temp(Src) 98 F (36.7 C) (Oral)  Resp 16  Ht 5\' 6"  (1.676 m)  Wt 273 lb (123.832 kg)  BMI 44.08 kg/m2  SpO2 100%  LMP 08/09/2002 Physical Exam  2005: Physical examination:  Nursing notes reviewed; Vital signs and O2 SAT reviewed;  Constitutional: Well developed, Well nourished, Well hydrated, In no acute distress; Head:  Normocephalic, atraumatic; Eyes: EOMI, PERRL, No scleral icterus; ENMT: Mouth and pharynx normal, Mucous membranes moist; Neck: Supple, Full range of motion, No lymphadenopathy; Cardiovascular: Regular rate  and rhythm, No murmur, rub, or gallop; Respiratory: Breath sounds clear & equal bilaterally, No rales, rhonchi, wheezes.  Speaking full sentences with ease, Normal respiratory effort/excursion; Chest: Nontender, Movement normal; Abdomen: Soft, Nontender, Nondistended, Normal bowel sounds; Genitourinary: No CVA tenderness; Extremities: Pulses normal, No tenderness, No edema, No calf edema or asymmetry.; Neuro: AA&Ox3, Major CN grossly intact.  Speech clear. No gross focal motor or sensory deficits in extremities.; Skin: Color normal, Warm, Dry.; Psych:  Affect flat, poor eye contact. Endorses depression and SI.     ED Course  Procedures     EKG Interpretation None      MDM  MDM Reviewed: previous chart, nursing note and vitals Reviewed previous: labs Interpretation: labs      Results for orders placed or performed during the hospital encounter of 06/19/14  CBC with Differential  Result Value Ref Range  WBC 6.4 4.0 - 10.5 K/uL   RBC 4.48 3.87 - 5.11 MIL/uL   Hemoglobin 12.7 12.0 - 15.0 g/dL   HCT 16.1 09.6 - 04.5 %   MCV 87.5 78.0 - 100.0 fL   MCH 28.3 26.0 - 34.0 pg   MCHC 32.4 30.0 - 36.0 g/dL   RDW 40.9 81.1 - 91.4 %   Platelets 211 150 - 400 K/uL   Neutrophils Relative % 59 43 - 77 %   Neutro Abs 3.8 1.7 - 7.7 K/uL   Lymphocytes Relative 29 12 - 46 %   Lymphs Abs 1.9 0.7 - 4.0 K/uL   Monocytes Relative 11 3 - 12 %   Monocytes Absolute 0.7 0.1 - 1.0 K/uL   Eosinophils Relative 1 0 - 5 %   Eosinophils Absolute 0.0 0.0 - 0.7 K/uL   Basophils Relative 0 0 - 1 %   Basophils Absolute 0.0 0.0 - 0.1 K/uL  Basic metabolic panel  Result Value Ref Range   Sodium 130 (L) 135 - 145 mmol/L   Potassium 3.7 3.5 - 5.1 mmol/L   Chloride 98 96 - 112 mmol/L   CO2 28 19 - 32 mmol/L   Glucose, Bld 106 (H) 70 - 99 mg/dL   BUN 10 6 - 23 mg/dL   Creatinine, Ser 7.82 0.50 - 1.10 mg/dL   Calcium 8.8 8.4 - 95.6 mg/dL   GFR calc non Af Amer 67 (L) >90 mL/min   GFR calc Af Amer 77 (L) >90  mL/min   Anion gap 4 (L) 5 - 15  Ethanol  Result Value Ref Range   Alcohol, Ethyl (B) <5 0 - 9 mg/dL  Drug screen panel, emergency  Result Value Ref Range   Opiates POSITIVE (A) NONE DETECTED   Cocaine NONE DETECTED NONE DETECTED   Benzodiazepines NONE DETECTED NONE DETECTED   Amphetamines NONE DETECTED NONE DETECTED   Tetrahydrocannabinol NONE DETECTED NONE DETECTED   Barbiturates NONE DETECTED NONE DETECTED  Urinalysis, Routine w reflex microscopic  Result Value Ref Range   Color, Urine YELLOW YELLOW   APPearance CLEAR CLEAR   Specific Gravity, Urine 1.010 1.005 - 1.030   pH 5.5 5.0 - 8.0   Glucose, UA NEGATIVE NEGATIVE mg/dL   Hgb urine dipstick NEGATIVE NEGATIVE   Bilirubin Urine NEGATIVE NEGATIVE   Ketones, ur NEGATIVE NEGATIVE mg/dL   Protein, ur NEGATIVE NEGATIVE mg/dL   Urobilinogen, UA 0.2 0.0 - 1.0 mg/dL   Nitrite NEGATIVE NEGATIVE   Leukocytes, UA NEGATIVE NEGATIVE     2000:  Labs near baseline. TTS eval pending.     Samuel Jester, DO 06/19/14 2211

## 2014-06-19 NOTE — Telephone Encounter (Signed)
Per Natalia LeatherwoodKatherine at [facility] patient states that she is having suicidal thoughts. I called patient and she states that the celexa does not seem to be helping her. She states that she is having thoughts of hurting herself. Patient states that she feels like it worked better when she took it in the am with her clonazepam. [facility] been [facility] of her suicidal  thoughts.

## 2014-06-20 ENCOUNTER — Telehealth: Payer: Self-pay | Admitting: Family Medicine

## 2014-06-20 MED ORDER — ACETAMINOPHEN 325 MG PO TABS
650.0000 mg | ORAL_TABLET | Freq: Once | ORAL | Status: AC
  Administered 2014-06-20: 650 mg via ORAL

## 2014-06-20 MED ORDER — ACETAMINOPHEN 325 MG PO TABS
ORAL_TABLET | ORAL | Status: AC
  Filled 2014-06-20: qty 2

## 2014-06-20 MED ORDER — CLONAZEPAM 1 MG PO TABS: 1.0000 mg | ORAL_TABLET | Freq: Two times a day (BID) | ORAL | Status: DC | PRN

## 2014-06-20 NOTE — Telephone Encounter (Signed)
rx called into pharmacy and patient aware.  

## 2014-06-20 NOTE — Telephone Encounter (Signed)
Looks like MMM has ordered

## 2014-06-20 NOTE — Discharge Instructions (Signed)
Follow-up the as per directed by behavioral health. Return for any new or worse symptoms. Behavioral health has cleared you for discharge home.

## 2014-06-20 NOTE — ED Notes (Signed)
Pt up to [facility] with assistance of 1.

## 2014-06-20 NOTE — Telephone Encounter (Signed)
[facility]oke with patient and [facility]

## 2014-06-20 NOTE — ED Notes (Signed)
[facility] Discharge will be made today.

## 2014-07-01 ENCOUNTER — Other Ambulatory Visit: Payer: Self-pay | Admitting: *Deleted

## 2014-07-01 MED ORDER — CITALOPRAM HYDROBROMIDE 20 MG PO TABS: ORAL_TABLET | ORAL | Status: DC

## 2014-07-02 ENCOUNTER — Encounter (HOSPITAL_COMMUNITY): Payer: Self-pay | Admitting: *Deleted

## 2014-07-02 ENCOUNTER — Ambulatory Visit (HOSPITAL_COMMUNITY)
Admission: RE | Admit: 2014-07-02 | Discharge: 2014-07-02 | Disposition: A | Discharge status: Home / Self Care | Payer: Medicare Other | Source: Ambulatory Visit | Attending: Internal Medicine | Admitting: Internal Medicine

## 2014-07-02 ENCOUNTER — Encounter (HOSPITAL_COMMUNITY)
Admission: RE | Disposition: A | Discharge status: Home / Self Care | Payer: Self-pay | Source: Ambulatory Visit | Attending: Internal Medicine

## 2014-07-02 DIAGNOSIS — I10 Essential (primary) hypertension: Secondary | ICD-10-CM | POA: Diagnosis not present

## 2014-07-02 DIAGNOSIS — K449 Diaphragmatic hernia without obstruction or gangrene: Secondary | ICD-10-CM | POA: Insufficient documentation

## 2014-07-02 DIAGNOSIS — F419 Anxiety disorder, unspecified: Secondary | ICD-10-CM | POA: Insufficient documentation

## 2014-07-02 DIAGNOSIS — Z79899 Other long term (current) drug therapy: Secondary | ICD-10-CM | POA: Insufficient documentation

## 2014-07-02 DIAGNOSIS — F329 Major depressive disorder, single episode, unspecified: Secondary | ICD-10-CM | POA: Diagnosis not present

## 2014-07-02 DIAGNOSIS — M1990 Unspecified osteoarthritis, unspecified site: Secondary | ICD-10-CM | POA: Insufficient documentation

## 2014-07-02 DIAGNOSIS — M8580 Other specified disorders of bone density and structure, unspecified site: Secondary | ICD-10-CM | POA: Diagnosis not present

## 2014-07-02 DIAGNOSIS — E780 Pure hypercholesterolemia: Secondary | ICD-10-CM | POA: Insufficient documentation

## 2014-07-02 DIAGNOSIS — K224 Dyskinesia of esophagus: Secondary | ICD-10-CM | POA: Diagnosis not present

## 2014-07-02 DIAGNOSIS — K228 Other specified diseases of esophagus: Secondary | ICD-10-CM | POA: Diagnosis not present

## 2014-07-02 DIAGNOSIS — K219 Gastro-esophageal reflux disease without esophagitis: Secondary | ICD-10-CM | POA: Diagnosis not present

## 2014-07-02 DIAGNOSIS — K222 Esophageal obstruction: Secondary | ICD-10-CM | POA: Diagnosis not present

## 2014-07-02 DIAGNOSIS — R609 Edema, unspecified: Secondary | ICD-10-CM

## 2014-07-02 DIAGNOSIS — R1310 Dysphagia, unspecified: Secondary | ICD-10-CM | POA: Insufficient documentation

## 2014-07-02 DIAGNOSIS — Z7982 Long term (current) use of aspirin: Secondary | ICD-10-CM | POA: Insufficient documentation

## 2014-07-02 DIAGNOSIS — Z800 Family history of malignant neoplasm of digestive organs: Secondary | ICD-10-CM | POA: Diagnosis not present

## 2014-07-02 DIAGNOSIS — J45909 Unspecified asthma, uncomplicated: Secondary | ICD-10-CM | POA: Diagnosis not present

## 2014-07-02 HISTORY — PX: BALLOON DILATION: SHX5330

## 2014-07-02 HISTORY — PX: BOTOX INJECTION: SHX5754

## 2014-07-02 HISTORY — PX: ESOPHAGOGASTRODUODENOSCOPY: SHX5428

## 2014-07-02 SURGERY — EGD (ESOPHAGOGASTRODUODENOSCOPY)
Anesthesia: Moderate Sedation

## 2014-07-02 MED ORDER — FENTANYL CITRATE 0.05 MG/ML IJ SOLN
INTRAMUSCULAR | Status: DC | PRN
  Administered 2014-07-02 (×3): 25 ug via INTRAVENOUS

## 2014-07-02 MED ORDER — FENTANYL CITRATE 0.05 MG/ML IJ SOLN
INTRAMUSCULAR | Status: AC
  Filled 2014-07-02: qty 2

## 2014-07-02 MED ORDER — FUROSEMIDE 20 MG PO TABS: 20.0000 mg | ORAL_TABLET | ORAL | Status: DC

## 2014-07-02 MED ORDER — BUTAMBEN-TETRACAINE-BENZOCAINE 2-2-14 % EX AERO
INHALATION_SPRAY | CUTANEOUS | Status: DC | PRN
  Administered 2014-07-02: 2 via TOPICAL

## 2014-07-02 MED ORDER — MIDAZOLAM HCL 10 MG/2ML IJ SOLN
INTRAMUSCULAR | Status: DC | PRN
  Administered 2014-07-02: 2 mg via INTRAVENOUS
  Administered 2014-07-02: 1 mg via INTRAVENOUS
  Administered 2014-07-02 (×2): 2 mg via INTRAVENOUS
  Administered 2014-07-02: 1 mg via INTRAVENOUS

## 2014-07-02 MED ORDER — SODIUM CHLORIDE 0.9 % IJ SOLN
100.0000 [IU] | Freq: Once | INTRAMUSCULAR | Status: DC
  Filled 2014-07-02: qty 100

## 2014-07-02 MED ORDER — MIDAZOLAM HCL 10 MG/2ML IJ SOLN
INTRAMUSCULAR | Status: AC
  Filled 2014-07-02: qty 2

## 2014-07-02 MED ORDER — SODIUM CHLORIDE 0.9 % IV SOLN: INTRAVENOUS | Status: DC

## 2014-07-02 MED ORDER — DIPHENHYDRAMINE HCL 50 MG/ML IJ SOLN
INTRAMUSCULAR | Status: AC
  Filled 2014-07-02: qty 1

## 2014-07-02 NOTE — Op Note (Addendum)
[facility] [address] [place], [id number]   ENDOSCOPY PROCEDURE REPORT  PATIENT: Ander SladeHampton, Rolonda J  MR#: [id number] BIRTHDATE: 04/18/1951 , 63  yrs. old GENDER: female ENDOSCOPIST: Iva Booparl E Roger Fasnacht, MD, [facility] PROCEDURE DATE:  07/02/2014 PROCEDURE:  EGD w/ balloon dilation ASA CLASS:     Class III INDICATIONS:  dysphagia and ? stricture on Ba swallow. MEDICATIONS: Fentanyl 75 mcg IV and Versed 8 mg IV TOPICAL ANESTHETIC: Cetacaine Spray  DESCRIPTION OF PROCEDURE: After the risks benefits and alternatives of the procedure were thoroughly explained, informed consent was obtained.  The Pentax Gastroscope [id number] endoscope was introduced through the mouth and advanced to the second portion of the duodenum , Without limitations.  The instrument was slowly withdrawn as the mucosa was fully examined.  1) Tortuous esophagus (mild) suggesting dysmotility. 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  Retroflexed views revealed a hiatal hernia.     The scope was then withdrawn from the patient and the procedure completed.  COMPLICATIONS: There were no immediate complications.  ENDOSCOPIC IMPRESSION: 1) Tortuous esophagus (mild) suggesting dysmotility. 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  Clear liquids until 11 AM, then soft foods rest of day.  Resume prior diet tomorrow. 2.  Call back if persistent dysphagia - next step would most likely be manometry 3.  Try to use MAC- deep sedation for any future procedures   eSigned:  Iva Booparl E Shaquia Berkley, MD, [facility] 07/02/2014 10:14 AM Revised: 07/02/2014 10:14 AM   CC:The Patient

## 2014-07-02 NOTE — H&P (Signed)
[facility] History and Physical   Primary Care Physician:  Frederica KusterMILLER, STEPHEN M, MD   Reason for Procedure:   Evaluate and treat dysphagia - stricture vs dysmotility/achalasia  Plan:    EGD, possible esophageal dilation or Botox injection     The risks and benefits as well as alternatives of endoscopic procedure(s) have been discussed and reviewed. All questions answered. The patient agrees to proceed.      HPI: Lauren Harmon is a 64 y.o. female with intermittent dysphagia. 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   Past Medical History  Diagnosis Date  . Anxiety   . H/O: knee surgery   . Hypertension   . Allergy   . Depression   . Asthma   . Osteoarthritis   . Palpitations   . Osteopenia   . Hemorrhoid   . Panic attack   . Hypercholesteremia   . GERD (gastroesophageal reflux disease)     Past Surgical History  Procedure Laterality Date  . Eye surgery    . Knee surgery      Prior to Admission medications   Medication Sig Start Date End Date Taking? Authorizing Provider  amLODipine-benazepril (LOTREL) 5-20 MG per capsule TAKE 1 CAPSULE BY MOUTH DAILY. 04/30/14  Yes Frederica KusterStephen M Miller, MD  aspirin EC 81 MG tablet Take 81 mg by mouth every morning.    Yes Historical Provider, MD  atorvastatin (LIPITOR) 40 MG tablet Take 1 tablet (40 mg total) by mouth daily. Patient taking differently: Take 40 mg by mouth every morning.  01/28/14  Yes Mary-Margaret Daphine DeutscherMartin, FNP  citalopram (CELEXA) 20 MG tablet Take 1 tab at lunch and 1 tab at bedtime 07/01/14  Yes Frederica KusterStephen M Miller, MD  clonazePAM (KLONOPIN) 1 MG tablet Take 1 tablet (1 mg total) by mouth 2 (two) times daily as needed for anxiety. Am & 6pm 06/20/14  Yes Junie Spencerhristy A Hawks, FNP  cyclobenzaprine (FLEXERIL) 10 MG tablet Take 10 mg by mouth every morning.    Yes Historical Provider, MD  dicyclomine (BENTYL) 20 MG tablet Take 1 tablet (20 mg total) by mouth at bedtime. 06/03/14  Yes Iva Booparl E Prachi Oftedahl, MD   esomeprazole (NEXIUM) 40 MG capsule Take 1 capsule (40 mg total) by mouth daily. 01/28/14  Yes Mary-Margaret Daphine DeutscherMartin, FNP  furosemide (LASIX) 20 MG tablet Take 1 tablet (20 mg total) by mouth 2 (two) times daily. Patient taking differently: Take 20 mg by mouth every other day.  01/01/14  Yes Mary-Margaret Daphine DeutscherMartin, FNP  HYDROcodone-acetaminophen (NORCO) 10-325 MG per tablet Take 1 tablet by mouth 2 (two) times daily. Am & 2pm   Yes Historical Provider, MD  meloxicam (MOBIC) 15 MG tablet TAKE 1 TABLET (15 MG TOTAL) BY MOUTH DAILY. 04/30/14  Yes Frederica KusterStephen M Miller, MD  montelukast (SINGULAIR) 10 MG tablet TAKE 1 TABLET (10 MG TOTAL) BY MOUTH AT BEDTIME. 04/30/14  Yes Frederica KusterStephen M Miller, MD  sulfamethoxazole-trimethoprim (BACTRIM DS,SEPTRA DS) 800-160 MG per tablet Take 1 tablet by mouth 2 (two) times daily. 06/11/14  Yes Junie Spencerhristy A Hawks, FNP  Vitamin D, Ergocalciferol, (DRISDOL) 50000 UNITS CAPS capsule Take 1 capsule (50,000 Units total) by mouth every Tuesday. 05/07/14  Yes Frederica KusterStephen M Miller, MD  docusate sodium (COLACE) 100 MG capsule Take 100 mg by mouth daily as needed for mild constipation.     Historical Provider, MD  NEXIUM 40 MG capsule TAKE 1 CAPSULE (40 MG TOTAL) BY MOUTH DAILY. Patient not taking: Reported on 06/19/2014 05/26/14   Bertram MillardStephen M  Hyacinth Meeker, MD  promethazine (PHENERGAN) 12.5 MG tablet TAKE 1 TABLET (12.5 MG TOTAL) BY MOUTH EVERY 6 (EIGHT) HOURS AS NEEDED FOR NAUSEA. Patient not taking: Reported on 06/19/2014 05/15/14   Frederica Kuster, MD  promethazine (PHENERGAN) 12.5 MG tablet TAKE 1 TABLET (12.5 MG TOTAL) BY MOUTH EVERY 6 (EIGHT) HOURS AS NEEDED FOR NAUSEA. Patient not taking: Reported on 06/19/2014 05/19/14   Frederica Kuster, MD    Current Facility-Administered Medications  Medication Dose Route Frequency Provider Last Rate Last Dose  . 0.9 %  sodium chloride infusion   Intravenous Continuous Iva Boop, MD      . botox 100 units/NS 4 mL for final conc. 25 units/mL mixture  100 Units  Submucosal Once Iva Boop, MD        Allergies as of 06/13/2014 - Review Complete 06/11/2014  Allergen Reaction Noted  . Wellbutrin [bupropion hcl] Nausea Only 09/21/2010    Family History  Problem Relation Age of Onset  . Osteoarthritis Mother   . Hypertension Mother   . Hypertension Father   . Hyperlipidemia Father   . Heart disease Father   . Osteoarthritis Father   . Colon cancer Father   . Lung cancer Maternal Uncle     smoker    History   Social History  . Marital Status: Divorced    Spouse Name: N/A  . Number of Children: N/A  . Years of Education: N/A   Occupational History  . Not on file.   Social History Main Topics  . Smoking status: Never Smoker   . Smokeless tobacco: Not on file  . Alcohol Use: No  . Drug Use: No  . Sexual Activity: No   Other Topics Concern  . Not on file   Social History Narrative    Review of Systems: Positive for depression All other review of systems negative except as mentioned in the HPI.  Physical Exam: Vital signs in last 24 hours: Temp:  [97.9 F (36.6 C)] 97.9 F (36.6 C) (02/17 0914) Pulse Rate:  [88] 88 (02/17 0914) Resp:  [19] 19 (02/17 0914) BP: (190)/(97) 190/97 mmHg (02/17 0914) SpO2:  [99 %] 99 % (02/17 0914)   General:   Alert,  Well-developed, well-nourished, pleasant and cooperative in NAD Lungs:  Clear throughout to auscultation.   Heart:  Regular rate and rhythm; no murmurs, clicks, rubs,  or gallops. Abdomen:  Obese, Soft, nontender and nondistended. Normal bowel sounds.   Neuro/Psych:  Alert and cooperative. Normal mood and affect. A and O x 3    Sena Slate, MD, [facility] [phone] (pager) 07/02/2014 9:17 AM@

## 2014-07-02 NOTE — Discharge Instructions (Signed)
° °  No scar tissue or stricture seen. You do have a hiatal hernia (not a bad problem) and some narrowing where esophagus and stomach meet so I dilated that. If your swallowing problems continue call back - would plan to do a test called esophageal manometry. I appreciate the opportunity to care for you. Iva Booparl E. Zipporah Finamore, MD, FACG   YOU HAD AN ENDOSCOPIC PROCEDURE TODAY: Refer to the procedure report and other information in the discharge instructions given to you for any specific questions about what was found during the examination. If this information does not answer your questions, please call Dr. Marvell FullerGessner's office at [phone] to clarify.   YOU SHOULD EXPECT: Some feelings of bloating in the abdomen. Passage of more gas than usual. Walking can help get rid of the air that was put into your GI tract during the procedure and reduce the bloating. If you had a lower endoscopy (such as a colonoscopy or flexible sigmoidoscopy) you may notice spotting of blood in your stool or on the toilet paper. Some abdominal soreness may be present for a day or two, also.  DIET:   Start with clear liquids only until 11 AM then soft foods today. Normal foods tomorrow. Drink plenty of fluids but you should avoid alcoholic beverages for 24 hours.   ACTIVITY: Your care partner should take you home directly after the procedure. You should plan to take it easy, moving slowly for the rest of the day. You can resume normal activity the day after the procedure however YOU SHOULD NOT DRIVE, use power tools, machinery or perform tasks that involve climbing or major physical exertion for 24 hours (because of the sedation medicines used during the test).   SYMPTOMS TO REPORT IMMEDIATELY: A gastroenterologist can be reached at any hour. Please call [phone]  for any of the following symptoms:  Following upper endoscopy (EGD, EUS, ERCP, esophageal dilation) Vomiting of blood or coffee ground material  New,  significant abdominal pain  New, significant chest pain or pain under the shoulder blades  Painful or persistently difficult swallowing  New shortness of breath  Black, tarry-looking or red, bloody stools

## 2014-07-03 ENCOUNTER — Encounter (HOSPITAL_COMMUNITY): Payer: Self-pay | Admitting: Internal Medicine

## 2014-07-04 ENCOUNTER — Ambulatory Visit (INDEPENDENT_AMBULATORY_CARE_PROVIDER_SITE_OTHER): Payer: Medicare Other | Admitting: Family Medicine

## 2014-07-04 ENCOUNTER — Encounter: Payer: Self-pay | Admitting: Family Medicine

## 2014-07-04 VITALS — BP 118/76 | HR 82 | Temp 98.1°F | Ht 66.0 in | Wt 290.0 lb

## 2014-07-04 DIAGNOSIS — F32A Depression, unspecified: Secondary | ICD-10-CM

## 2014-07-04 DIAGNOSIS — F419 Anxiety disorder, unspecified: Secondary | ICD-10-CM | POA: Diagnosis not present

## 2014-07-04 DIAGNOSIS — F329 Major depressive disorder, single episode, unspecified: Secondary | ICD-10-CM

## 2014-07-04 MED ORDER — DULOXETINE HCL 30 MG PO CPEP: ORAL_CAPSULE | ORAL | Status: DC

## 2014-07-04 MED ORDER — CLONAZEPAM 1 MG PO TABS: ORAL_TABLET | ORAL | Status: DC

## 2014-07-04 NOTE — Progress Notes (Signed)
Subjective:    Patient ID: Lauren Harmon, female    DOB: Aug 21, 1950, 64 y.o.   MRN: [id number]  HPI  64 year old female who is here to follow-up with her anxiety and depression. Since her last visit here she has been in the emergency room twice for problems related to these diagnoses. The last time she was in the emergency room they gave her some hydroxyzine which she thought was really helpful but I explained to her that it was merely an antihistamine and sedated her and really did not help her anxiety like clonazepam might. We spent a fair amount of time talking about her medicines. When I first saw her she was on Effexor and alprazolam and stated at that time that these medicines did not seem to be really effective, so I switched her to citalopram and clonazepam. Now she is telling me the same finding that is these medicines are not effective. It sounds like when I really listen to her though that the clonazepam is effective it may be if she were taking it more frequently it would work better and I have suggested that we do that. I did suggest we could stop the citalopram in favor of Cymbalta, so we will begin with 30 mg a day for 1 week and then increase to 60 mg a day as a maintenance dose and see how she does with that.  She has an appointment at [facility] next week to see if her leg has healed and should be able to start physical therapy but it seems that we have been saying that same pain for several months now so I am not optimistic.  She still continues to have bladder symptoms without positive findings. She thinks the citalopram may be causing her bladder symptoms but time will tell since we have stopped it.  Patient Active Problem List   Diagnosis Date Noted  . Dysphagia   . Esophageal stenosis   . Anxiety   . GAD (generalized anxiety disorder) 02/11/2013  . Hypertension 09/21/2010  . Hyperlipidemia 09/21/2010  . Osteoarthritis 09/21/2010  . Depression 09/21/2010  . Asthma 09/21/2010    . Obesity 09/21/2010  . Osteopenia 09/21/2010  . Hemorrhoids 09/21/2010  . Lumbar disc disease 09/21/2010  . Esophageal dysmotility 09/21/2010   Outpatient Encounter Prescriptions as of 07/04/2014  Medication Sig  . amLODipine-benazepril (LOTREL) 5-20 MG per capsule TAKE 1 CAPSULE BY MOUTH DAILY.  Marland Kitchen aspirin EC 81 MG tablet Take 81 mg by mouth every morning.   Marland Kitchen atorvastatin (LIPITOR) 40 MG tablet Take 1 tablet (40 mg total) by mouth daily. (Patient taking differently: Take 40 mg by mouth every morning. )  . citalopram (CELEXA) 20 MG tablet Take 1 tab at lunch and 1 tab at bedtime  . clonazePAM (KLONOPIN) 1 MG tablet Take 1 tablet (1 mg total) by mouth 2 (two) times daily as needed for anxiety. Am & 6pm  . cyclobenzaprine (FLEXERIL) 10 MG tablet Take 10 mg by mouth every morning.   . dicyclomine (BENTYL) 20 MG tablet Take 1 tablet (20 mg total) by mouth at bedtime.  . docusate sodium (COLACE) 100 MG capsule Take 100 mg by mouth daily as needed for mild constipation.   Marland Kitchen esomeprazole (NEXIUM) 40 MG capsule Take 1 capsule (40 mg total) by mouth daily.  . furosemide (LASIX) 20 MG tablet Take 1 tablet (20 mg total) by mouth every other day.  Marland Kitchen HYDROcodone-acetaminophen (NORCO) 10-325 MG per tablet Take 1 tablet by mouth 2 (two)  times daily. Am & 2pm  . meloxicam (MOBIC) 15 MG tablet TAKE 1 TABLET (15 MG TOTAL) BY MOUTH DAILY.  . montelukast (SINGULAIR) 10 MG tablet TAKE 1 TABLET (10 MG TOTAL) BY MOUTH AT BEDTIME.  Marland Kitchen. Vitamin D, Ergocalciferol, (DRISDOL) 50000 UNITS CAPS capsule Take 1 capsule (50,000 Units total) by mouth every Tuesday.  . [DISCONTINUED] promethazine (PHENERGAN) 12.5 MG tablet TAKE 1 TABLET (12.5 MG TOTAL) BY MOUTH EVERY 6 (EIGHT) HOURS AS NEEDED FOR NAUSEA.  . [DISCONTINUED] sulfamethoxazole-trimethoprim (BACTRIM DS,SEPTRA DS) 800-160 MG per tablet Take 1 tablet by mouth 2 (two) times daily.    Review of Systems  Constitutional: Negative.   Respiratory: Negative.    Cardiovascular: Negative.   Gastrointestinal: Positive for abdominal pain.  Psychiatric/Behavioral: Positive for suicidal ideas and confusion. The patient is nervous/anxious.        Objective:   Physical Exam  Cardiovascular: Normal rate and regular rhythm.   Pulmonary/Chest: Effort normal and breath sounds normal.  Psychiatric:  She does not appear overtly depressed with crying and poor eye contact nor does she appear really anxious or fidgety.    BP 118/76 mmHg  Pulse 82  Temp(Src) 98.1 F (36.7 C) (Oral)  Ht 5\' 6"  (1.676 m)  Wt 290 lb (131.543 kg)  BMI 46.83 kg/m2  LMP 08/09/2002       Assessment & Plan:  1. Depression Begin Cymbalta, 30 mg building to 60 mg  2. Anxiety Increase frequency dosing from BID to QID  Frederica KusterStephen M Miller MD

## 2014-07-04 NOTE — Patient Instructions (Signed)
Stop Citalopram Take Klonopin at 6 am, 12 noon, 6pm, 10pm We are starting Cymbalta 30mg  once a day for a week and then 2 pills after the first week

## 2014-07-22 ENCOUNTER — Telehealth: Payer: Self-pay | Admitting: Family Medicine

## 2014-07-22 NOTE — Telephone Encounter (Signed)
Patient states that when she takes the 2 cymbalta together that she gets real drowsy. Patient is going to give it 2 weeks to see if symptoms improve.

## 2014-07-23 ENCOUNTER — Telehealth: Payer: Self-pay | Admitting: Family Medicine

## 2014-07-25 ENCOUNTER — Other Ambulatory Visit: Payer: Self-pay | Admitting: *Deleted

## 2014-07-25 MED ORDER — CITALOPRAM HYDROBROMIDE 20 MG PO TABS: ORAL_TABLET | ORAL | Status: DC

## 2014-07-26 ENCOUNTER — Other Ambulatory Visit: Payer: Self-pay | Admitting: Nurse Practitioner

## 2014-07-28 DIAGNOSIS — M1712 Unilateral primary osteoarthritis, left knee: Secondary | ICD-10-CM | POA: Diagnosis not present

## 2014-07-28 DIAGNOSIS — M25562 Pain in left knee: Secondary | ICD-10-CM | POA: Diagnosis not present

## 2014-07-28 DIAGNOSIS — I10 Essential (primary) hypertension: Secondary | ICD-10-CM | POA: Diagnosis not present

## 2014-07-30 DIAGNOSIS — R279 Unspecified lack of coordination: Secondary | ICD-10-CM | POA: Diagnosis not present

## 2014-07-30 DIAGNOSIS — S72401D Unspecified fracture of lower end of right femur, subsequent encounter for closed fracture with routine healing: Secondary | ICD-10-CM | POA: Diagnosis not present

## 2014-07-30 DIAGNOSIS — Z8781 Personal history of (healed) traumatic fracture: Secondary | ICD-10-CM | POA: Diagnosis not present

## 2014-07-30 DIAGNOSIS — Z743 Need for continuous supervision: Secondary | ICD-10-CM | POA: Diagnosis not present

## 2014-07-30 DIAGNOSIS — Z967 Presence of other bone and tendon implants: Secondary | ICD-10-CM | POA: Diagnosis not present

## 2014-07-30 DIAGNOSIS — R0902 Hypoxemia: Secondary | ICD-10-CM | POA: Diagnosis not present

## 2014-08-01 NOTE — Telephone Encounter (Signed)
Several attempts have been made to contact patient this encounter will be closed.  

## 2014-08-07 ENCOUNTER — Ambulatory Visit (INDEPENDENT_AMBULATORY_CARE_PROVIDER_SITE_OTHER): Payer: Medicare Other | Admitting: Family Medicine

## 2014-08-07 ENCOUNTER — Encounter: Payer: Self-pay | Admitting: Family Medicine

## 2014-08-07 VITALS — BP 142/78 | HR 93 | Temp 97.6°F

## 2014-08-07 DIAGNOSIS — F419 Anxiety disorder, unspecified: Secondary | ICD-10-CM

## 2014-08-07 DIAGNOSIS — F32A Depression, unspecified: Secondary | ICD-10-CM

## 2014-08-07 DIAGNOSIS — E785 Hyperlipidemia, unspecified: Secondary | ICD-10-CM

## 2014-08-07 DIAGNOSIS — F329 Major depressive disorder, single episode, unspecified: Secondary | ICD-10-CM | POA: Diagnosis not present

## 2014-08-07 DIAGNOSIS — I10 Essential (primary) hypertension: Secondary | ICD-10-CM

## 2014-08-07 NOTE — Progress Notes (Signed)
Subjective:    Patient ID: Ander Sladeorothy J Ehrmann, female    DOB: 10/26/1950, 64 y.o.   MRN: [id number]  HPI 64 year old female here to follow-up anxiety and depression. Since her last visit we instituted Cymbalta, beginning at 30 mg with current dose at 60 mg. She takes this midmorning and it does cause some drowsiness but she does not want to take it at night with her other medicines. Klonopin was also increased to 4 times a day and that does seem to be helping.  Since her last visit she is seen the orthopedist at [facility] who has her now on a walker, out of the wheelchair and hopefully physical therapy will begin soon.  She also complains of some mild urinary retention is most likely related to one or more of her medicines. She thinks it may be the dicyclomine that she takes from the gastroenterologist but she feels the benefits outweigh the side effects and plans to continue taking this bad.  . Patient Active Problem List   Diagnosis Date Noted  . Dysphagia   . Esophageal stenosis   . Anxiety   . GAD (generalized anxiety disorder) 02/11/2013  . Hypertension 09/21/2010  . Hyperlipidemia 09/21/2010  . Osteoarthritis 09/21/2010  . Depression 09/21/2010  . Asthma 09/21/2010  . Obesity 09/21/2010  . Osteopenia 09/21/2010  . Hemorrhoids 09/21/2010  . Lumbar disc disease 09/21/2010  . Esophageal dysmotility 09/21/2010   Outpatient Encounter Prescriptions as of 08/07/2014  Medication Sig  . amLODipine-benazepril (LOTREL) 5-20 MG per capsule TAKE 1 CAPSULE BY MOUTH DAILY.  Marland Kitchen. aspirin EC 81 MG tablet Take 81 mg by mouth every morning.   Marland Kitchen. atorvastatin (LIPITOR) 40 MG tablet TAKE 1 TABLET (40 MG TOTAL) BY MOUTH DAILY.  . clonazePAM (KLONOPIN) 1 MG tablet Take one at 6 am, 12 noon, 6 pm, and 10 pm  . dicyclomine (BENTYL) 20 MG tablet Take 1 tablet (20 mg total) by mouth at bedtime.  . docusate sodium (COLACE) 100 MG capsule Take 100 mg by mouth daily as needed for mild constipation.   . DULoxetine  (CYMBALTA) 30 MG capsule 1 pill daily around 10 am for the first week and the 2 pills daily around 10 am thereafter  . esomeprazole (NEXIUM) 40 MG capsule Take 1 capsule (40 mg total) by mouth daily.  . furosemide (LASIX) 20 MG tablet Take 1 tablet (20 mg total) by mouth every other day.  Marland Kitchen. HYDROcodone-acetaminophen (NORCO) 10-325 MG per tablet Take 1 tablet by mouth 2 (two) times daily. Am & 2pm  . meloxicam (MOBIC) 15 MG tablet TAKE 1 TABLET (15 MG TOTAL) BY MOUTH DAILY.  . montelukast (SINGULAIR) 10 MG tablet TAKE 1 TABLET (10 MG TOTAL) BY MOUTH AT BEDTIME.  Marland Kitchen. Vitamin D, Ergocalciferol, (DRISDOL) 50000 UNITS CAPS capsule Take 1 capsule (50,000 Units total) by mouth every Tuesday.  . [DISCONTINUED] citalopram (CELEXA) 20 MG tablet Take 1 tab at lunch and 1 tab at bedtime  . [DISCONTINUED] cyclobenzaprine (FLEXERIL) 10 MG tablet Take 10 mg by mouth every morning.        Review of Systems  Constitutional: Negative.   HENT: Negative.   Respiratory: Negative.   Cardiovascular: Negative.   Gastrointestinal: Positive for abdominal pain.  Psychiatric/Behavioral: Positive for suicidal ideas and dysphoric mood.       Objective:   Physical Exam  Constitutional: She is oriented to person, place, and time. She appears well-developed and well-nourished.  Cardiovascular: Normal rate.   Pulmonary/Chest: Effort normal.  Neurological: She  is alert and oriented to person, place, and time.  Psychiatric: She has a normal mood and affect. Her behavior is normal. Judgment and thought content normal.     BP 142/78 mmHg  Pulse 93  Temp(Src) 97.6 F (36.4 C) (Oral)  Wt   LMP 08/09/2002         Assessment & Plan:  1. Anxiety Improved on increased dose of Klonopin. Continue same regimen  2. Depression Depression also has improved. There are less thoughts of self injury or suicide and plan is to continue  3. Essential hypertension Blood pressure remains well controlled on current regimen  of amlodipine and benazepril  4. Hyperlipidemia We'll need to check lipids when she returns at her next visit  Frederica Kuster MD

## 2014-08-11 ENCOUNTER — Other Ambulatory Visit: Payer: Self-pay | Admitting: *Deleted

## 2014-08-11 ENCOUNTER — Ambulatory Visit: Payer: Medicare Other | Admitting: Family Medicine

## 2014-08-11 MED ORDER — CLONAZEPAM 1 MG PO TABS: ORAL_TABLET | ORAL | Status: DC

## 2014-08-11 NOTE — Telephone Encounter (Signed)
Medication was not called in at appt on 3/24.  Left refill authorization on pharmacy voicemail.

## 2014-08-15 ENCOUNTER — Other Ambulatory Visit: Payer: Self-pay | Admitting: *Deleted

## 2014-08-15 MED ORDER — MELOXICAM 15 MG PO TABS: ORAL_TABLET | ORAL | Status: DC

## 2014-08-20 ENCOUNTER — Encounter: Payer: Self-pay | Admitting: Family Medicine

## 2014-08-20 ENCOUNTER — Ambulatory Visit (INDEPENDENT_AMBULATORY_CARE_PROVIDER_SITE_OTHER): Payer: Medicare Other | Admitting: Family Medicine

## 2014-08-20 VITALS — BP 146/92 | HR 78 | Temp 97.5°F | Ht 66.0 in

## 2014-08-20 DIAGNOSIS — R350 Frequency of micturition: Secondary | ICD-10-CM

## 2014-08-20 DIAGNOSIS — F411 Generalized anxiety disorder: Secondary | ICD-10-CM

## 2014-08-20 DIAGNOSIS — I10 Essential (primary) hypertension: Secondary | ICD-10-CM

## 2014-08-20 DIAGNOSIS — E785 Hyperlipidemia, unspecified: Secondary | ICD-10-CM | POA: Diagnosis not present

## 2014-08-20 DIAGNOSIS — F329 Major depressive disorder, single episode, unspecified: Secondary | ICD-10-CM | POA: Diagnosis not present

## 2014-08-20 DIAGNOSIS — F32A Depression, unspecified: Secondary | ICD-10-CM

## 2014-08-20 NOTE — Progress Notes (Signed)
Subjective:    Patient ID: Lauren Harmon, female    DOB: 03/08/1951, 64 y.o.   MRN: [id number]  HPI 64 year old female here to follow-up depression anxiety and urinary retention. She was seen only 2 weeks ago not sure why she is back to us soon but she tells me today that she thinks 60 mg of Cymbalta does not agree with her and she would like to cut back to 30. That is okay with me but I think we were trying a higher dose because he has some neuropathic type pain in her legs. She is very difficult to treat. I think her expectations for medicines may not be as realistic as in some folks. She also complains of palpitations and thinks this is related to her Klonopin but palpitations are not associated with syncope, near syncope or chest pain. I tried to reassure her that palpitations are not necessarily dangerous after very brief with no associated symptoms.  Regarding the urinary retention it's really hard to get a handle on what she is talking about she really doesn't have frequency. Possible that the dicyclomine could is a smooth muscle rule out relaxant could be related to this complaint and she is agreeable to holding the medicine for one week to see if symptoms improve    Review of Systems  Psychiatric/Behavioral: Positive for suicidal ideas and sleep disturbance.       Objective:   Physical Exam  Constitutional: She appears well-developed and well-nourished.  Cardiovascular: Normal rate and regular rhythm.   Pulmonary/Chest: Effort normal and breath sounds normal.  Psychiatric:  Expectations are not realistic in regards medicine. I'm not optimistic that we will find a medicine which will really relieve all her symptoms as she expects          Assessment & Plan:   Patient Active Problem List   Diagnosis Date Noted  . Dysphagia   . Esophageal stenosis   . Anxiety   . GAD (generalized anxiety disorder) 02/11/2013  . Hypertension 09/21/2010  . Hyperlipidemia 09/21/2010  .  Osteoarthritis 09/21/2010  . Depression 09/21/2010  . Asthma 09/21/2010  . Obesity 09/21/2010  . Osteopenia 09/21/2010  . Hemorrhoids 09/21/2010  . Lumbar disc disease 09/21/2010  . Esophageal dysmotility 09/21/2010   Outpatient Encounter Prescriptions as of 08/20/2014  Medication Sig  . amLODipine-benazepril (LOTREL) 5-20 MG per capsule TAKE 1 CAPSULE BY MOUTH DAILY.  Marland Kitchen. aspirin EC 81 MG tablet Take 81 mg by mouth every morning.   Marland Kitchen. atorvastatin (LIPITOR) 40 MG tablet TAKE 1 TABLET (40 MG TOTAL) BY MOUTH DAILY.  . clonazePAM (KLONOPIN) 1 MG tablet Take one at 6 am, 12 noon, 6 pm, and 10 pm  . dicyclomine (BENTYL) 20 MG tablet Take 1 tablet (20 mg total) by mouth at bedtime.  . docusate sodium (COLACE) 100 MG capsule Take 100 mg by mouth daily as needed for mild constipation.   . DULoxetine (CYMBALTA) 30 MG capsule 1 pill daily around 10 am for the first week and the 2 pills daily around 10 am thereafter  . esomeprazole (NEXIUM) 40 MG capsule Take 1 capsule (40 mg total) by mouth daily.  . furosemide (LASIX) 20 MG tablet Take 1 tablet (20 mg total) by mouth every other day.  Marland Kitchen. HYDROcodone-acetaminophen (NORCO) 10-325 MG per tablet Take 1 tablet by mouth 2 (two) times daily. Am & 2pm  . meloxicam (MOBIC) 15 MG tablet TAKE 1 TABLET (15 MG TOTAL) BY MOUTH DAILY.  . montelukast (SINGULAIR) 10 MG  tablet TAKE 1 TABLET (10 MG TOTAL) BY MOUTH AT BEDTIME.  Marland Kitchen Vitamin D, Ergocalciferol, (DRISDOL) 50000 UNITS CAPS capsule Take 1 capsule (50,000 Units total) by mouth every Tuesday.  . [DISCONTINUED] citalopram (CELEXA) 20 MG tablet

## 2014-08-21 ENCOUNTER — Other Ambulatory Visit: Payer: Medicare Other

## 2014-08-21 DIAGNOSIS — Z1212 Encounter for screening for malignant neoplasm of rectum: Secondary | ICD-10-CM | POA: Diagnosis not present

## 2014-08-21 NOTE — Progress Notes (Signed)
Lab only 

## 2014-08-22 ENCOUNTER — Ambulatory Visit: Payer: Medicare Other | Admitting: Family Medicine

## 2014-08-22 LAB — FECAL OCCULT BLOOD, IMMUNOCHEMICAL: Fecal Occult Bld: NEGATIVE

## 2014-08-25 ENCOUNTER — Telehealth: Payer: Self-pay | Admitting: Family Medicine

## 2014-08-25 NOTE — Telephone Encounter (Signed)
-----   Message from Frederica KusterStephen M Miller, MD sent at 08/25/2014 11:37 AM EDT ----- FOB okay

## 2014-09-03 ENCOUNTER — Telehealth: Payer: Self-pay | Admitting: Family Medicine

## 2014-09-04 NOTE — Telephone Encounter (Signed)
Pt states that her Cymbalta she is taking 30mg  once a day and doesn't feel this is enough. At visit couple weeks ago she states it was mentioned to call back if this was not helping and either have her dose increased or change in medication

## 2014-09-05 MED ORDER — DULOXETINE HCL 60 MG PO CPEP: 60.0000 mg | ORAL_CAPSULE | Freq: Every day | ORAL | Status: DC

## 2014-09-05 NOTE — Telephone Encounter (Signed)
I recommend increasing dose to 60 mg a day

## 2014-09-05 NOTE — Telephone Encounter (Signed)
na

## 2014-09-05 NOTE — Telephone Encounter (Signed)
Patient aware and rx for 60mg  sent to pharmacy.

## 2014-09-10 DIAGNOSIS — M818 Other osteoporosis without current pathological fracture: Secondary | ICD-10-CM | POA: Diagnosis not present

## 2014-09-10 DIAGNOSIS — Z967 Presence of other bone and tendon implants: Secondary | ICD-10-CM | POA: Diagnosis not present

## 2014-09-10 DIAGNOSIS — Z8781 Personal history of (healed) traumatic fracture: Secondary | ICD-10-CM | POA: Diagnosis not present

## 2014-09-10 DIAGNOSIS — Z4789 Encounter for other orthopedic aftercare: Secondary | ICD-10-CM | POA: Diagnosis not present

## 2014-09-10 DIAGNOSIS — M810 Age-related osteoporosis without current pathological fracture: Secondary | ICD-10-CM | POA: Diagnosis not present

## 2014-09-10 DIAGNOSIS — R279 Unspecified lack of coordination: Secondary | ICD-10-CM | POA: Diagnosis not present

## 2014-09-10 DIAGNOSIS — Z743 Need for continuous supervision: Secondary | ICD-10-CM | POA: Diagnosis not present

## 2014-09-22 ENCOUNTER — Telehealth: Payer: Self-pay | Admitting: Family Medicine

## 2014-09-23 ENCOUNTER — Other Ambulatory Visit: Payer: Self-pay | Admitting: *Deleted

## 2014-09-23 MED ORDER — ESOMEPRAZOLE MAGNESIUM 40 MG PO CPDR: 40.0000 mg | DELAYED_RELEASE_CAPSULE | Freq: Every day | ORAL | Status: DC

## 2014-09-23 NOTE — Telephone Encounter (Signed)
Usually the dose of Cymbalta goes from 30-60-90. I'm not aware that she can even taken an 80 mg dose

## 2014-09-23 NOTE — Telephone Encounter (Signed)
Patient states that she feels like the cymbalta is working but thinks that it could be increased please advise

## 2014-09-24 NOTE — Telephone Encounter (Signed)
Patient states that she would like to increase to 90mg  if you agree?

## 2014-09-25 ENCOUNTER — Telehealth: Payer: Self-pay | Admitting: Family Medicine

## 2014-10-01 ENCOUNTER — Telehealth: Payer: Self-pay | Admitting: Family Medicine

## 2014-10-01 MED ORDER — DULOXETINE HCL 30 MG PO CPEP: 90.0000 mg | ORAL_CAPSULE | Freq: Every day | ORAL | Status: DC

## 2014-10-01 NOTE — Telephone Encounter (Signed)
Pt aware new Rx is at pharmacy for 90mg  daily of cymbalta, we received a prior authorization from pharmacy that pt's insurance will only pay for 60 mg daily. Our prior auth person will be working on this & pt is aware to give this till Friday.

## 2014-10-02 ENCOUNTER — Telehealth: Payer: Self-pay

## 2014-10-02 ENCOUNTER — Other Ambulatory Visit: Payer: Self-pay

## 2014-10-02 MED ORDER — DULOXETINE HCL 30 MG PO CPEP: 30.0000 mg | ORAL_CAPSULE | Freq: Every day | ORAL | Status: DC

## 2014-10-02 MED ORDER — DICYCLOMINE HCL 20 MG PO TABS: 20.0000 mg | ORAL_TABLET | Freq: Every day | ORAL | Status: DC

## 2014-10-02 NOTE — Telephone Encounter (Signed)
You increased her Cymbalta to 3 30 mg pills a day   Insurance won't pay for this without prior authorization   Would it be possible to write for Cymbalta 60 once a day and Cymbalta 30 once a day?   I think they will cover that.

## 2014-10-02 NOTE — Telephone Encounter (Signed)
Ins would not cover pt taking (3) 30mg  capsules daily RX changed to (1) 60mg  and (1) 30mg  capsule daily Pt notified or change

## 2014-10-07 ENCOUNTER — Ambulatory Visit: Payer: Medicare Other | Admitting: Family Medicine

## 2014-10-07 ENCOUNTER — Other Ambulatory Visit: Payer: Self-pay

## 2014-10-07 MED ORDER — CLONAZEPAM 1 MG PO TABS: ORAL_TABLET | ORAL | Status: DC

## 2014-10-07 NOTE — Telephone Encounter (Signed)
rx called into pharmacy

## 2014-10-07 NOTE — Telephone Encounter (Signed)
Last seen 08/20/14 Dr Hyacinth MeekerMiller  If approved route to nurse to call into [facility]

## 2014-10-11 DIAGNOSIS — N3090 Cystitis, unspecified without hematuria: Secondary | ICD-10-CM | POA: Diagnosis not present

## 2014-10-11 DIAGNOSIS — R300 Dysuria: Secondary | ICD-10-CM | POA: Diagnosis not present

## 2014-10-14 ENCOUNTER — Telehealth: Payer: Self-pay | Admitting: Family Medicine

## 2014-10-14 DIAGNOSIS — R300 Dysuria: Secondary | ICD-10-CM

## 2014-10-14 NOTE — Telephone Encounter (Signed)
Pt to come in for urine only on Thursday - after finishing antibiotic on Wednesday night.  - per Baxter HireKristen and Dr Hyacinth Meekermiller. Order placed

## 2014-10-16 ENCOUNTER — Other Ambulatory Visit (INDEPENDENT_AMBULATORY_CARE_PROVIDER_SITE_OTHER): Payer: Medicare Other

## 2014-10-16 DIAGNOSIS — R300 Dysuria: Secondary | ICD-10-CM | POA: Diagnosis not present

## 2014-10-16 LAB — POCT UA - MICROSCOPIC ONLY
Casts, Ur, LPF, POC: NEGATIVE
Crystals, Ur, HPF, POC: NEGATIVE
Mucus, UA: NEGATIVE
Yeast, UA: NEGATIVE

## 2014-10-16 LAB — POCT URINALYSIS DIPSTICK
Bilirubin, UA: NEGATIVE
Glucose, UA: NEGATIVE
KETONES UA: NEGATIVE
NITRITE UA: NEGATIVE
PROTEIN UA: NEGATIVE
Spec Grav, UA: <=1.005
Urobilinogen, UA: NEGATIVE
pH, UA: 7

## 2014-10-17 ENCOUNTER — Telehealth: Payer: Self-pay | Admitting: *Deleted

## 2014-10-17 LAB — URINE CULTURE: Organism ID, Bacteria: NO GROWTH

## 2014-10-17 NOTE — Telephone Encounter (Signed)
-----   Message from Ernestina Pennaonald W Moore, MD sent at 10/16/2014  9:41 PM EDT ----- The urinalysis has only an occasional WBC and an occasional red blood cell and this is within normal limits. There was trace leukocytes on the dipstick. Wait on the urine culture to decide if treatment is necessary------ is the patient having symptoms?

## 2014-10-20 ENCOUNTER — Telehealth: Payer: Self-pay | Admitting: *Deleted

## 2014-10-20 NOTE — Telephone Encounter (Signed)
-----   Message from Ernestina Pennaonald W Moore, MD sent at 10/17/2014 11:06 PM EDT ----- The urine culture on this patient had no growth and if she is taking antibiotic she should continue and complete the antibiotic course and no more treatment will be necessary

## 2014-10-20 NOTE — Telephone Encounter (Signed)
Patient aware and understands

## 2014-10-28 ENCOUNTER — Other Ambulatory Visit: Payer: Self-pay | Admitting: Family Medicine

## 2014-10-28 ENCOUNTER — Other Ambulatory Visit: Payer: Self-pay | Admitting: *Deleted

## 2014-10-28 MED ORDER — MONTELUKAST SODIUM 10 MG PO TABS: ORAL_TABLET | ORAL | Status: DC

## 2014-10-28 MED ORDER — ATORVASTATIN CALCIUM 40 MG PO TABS: ORAL_TABLET | ORAL | Status: DC

## 2014-10-31 ENCOUNTER — Encounter: Payer: Self-pay | Admitting: Family Medicine

## 2014-10-31 ENCOUNTER — Ambulatory Visit (INDEPENDENT_AMBULATORY_CARE_PROVIDER_SITE_OTHER): Payer: Medicare Other | Admitting: Family Medicine

## 2014-10-31 VITALS — BP 153/81 | HR 78 | Temp 97.4°F | Ht 66.0 in | Wt 285.0 lb

## 2014-10-31 DIAGNOSIS — I10 Essential (primary) hypertension: Secondary | ICD-10-CM | POA: Diagnosis not present

## 2014-10-31 DIAGNOSIS — F329 Major depressive disorder, single episode, unspecified: Secondary | ICD-10-CM | POA: Diagnosis not present

## 2014-10-31 DIAGNOSIS — M1712 Unilateral primary osteoarthritis, left knee: Secondary | ICD-10-CM | POA: Diagnosis not present

## 2014-10-31 DIAGNOSIS — F32A Depression, unspecified: Secondary | ICD-10-CM

## 2014-10-31 DIAGNOSIS — M25562 Pain in left knee: Secondary | ICD-10-CM | POA: Diagnosis not present

## 2014-10-31 MED ORDER — ESCITALOPRAM OXALATE 10 MG PO TABS: ORAL_TABLET | ORAL | Status: DC

## 2014-10-31 NOTE — Progress Notes (Signed)
Subjective:    Patient ID: Lauren Harmon, female    DOB: 1950/07/06, 64 y.o.   MRN: [id number]  HPI 64 year old female followed here for anxiety depression lipids. We have been around the block with her medications on her nerves. I think her expectations of any depressed since and what they actually do are not on the same page. We have tried Effexor Celexa Cymbalta and each time she claims there he are not helping and maybe even hurting or compromising her symptoms. She wants to change to another medicine and I explained that much of depression treatment is trial an area as is the case with other conditions but especially depression. I do think she is depressed she has minimal get up and go and not much interest in activities or doing things.  Patient Active Problem List   Diagnosis Date Noted  . Dysphagia   . Esophageal stenosis   . Anxiety   . GAD (generalized anxiety disorder) 02/11/2013  . Hypertension 09/21/2010  . Hyperlipidemia 09/21/2010  . Osteoarthritis 09/21/2010  . Depression 09/21/2010  . Asthma 09/21/2010  . Obesity 09/21/2010  . Osteopenia 09/21/2010  . Hemorrhoids 09/21/2010  . Lumbar disc disease 09/21/2010  . Esophageal dysmotility 09/21/2010   Outpatient Encounter Prescriptions as of 10/31/2014  Medication Sig  . amLODipine-benazepril (LOTREL) 5-20 MG per capsule TAKE 1 CAPSULE BY MOUTH DAILY.  Marland Kitchen aspirin EC 81 MG tablet Take 81 mg by mouth every morning.   Marland Kitchen atorvastatin (LIPITOR) 40 MG tablet TAKE 1 TABLET (40 MG TOTAL) BY MOUTH DAILY.  . clonazePAM (KLONOPIN) 1 MG tablet Take one at 6 am, 12 noon, 6 pm, and 10 pm  . docusate sodium (COLACE) 100 MG capsule Take 100 mg by mouth daily as needed for mild constipation.   . DULoxetine (CYMBALTA) 30 MG capsule Take 1 capsule (30 mg total) by mouth daily.  . DULoxetine (CYMBALTA) 60 MG capsule   . esomeprazole (NEXIUM) 40 MG capsule Take 1 capsule (40 mg total) by mouth daily.  . furosemide (LASIX) 20 MG tablet Take  1 tablet (20 mg total) by mouth every other day.  Marland Kitchen HYDROcodone-acetaminophen (NORCO) 7.5-325 MG per tablet Take 1 tablet by mouth every 6 (six) hours as needed for moderate pain.  . meloxicam (MOBIC) 15 MG tablet TAKE 1 TABLET (15 MG TOTAL) BY MOUTH DAILY.  . montelukast (SINGULAIR) 10 MG tablet TAKE 1 TABLET (10 MG TOTAL) BY MOUTH AT BEDTIME.  Marland Kitchen Vitamin D, Ergocalciferol, (DRISDOL) 50000 UNITS CAPS capsule Take 1 capsule (50,000 Units total) by mouth every Tuesday.  . [DISCONTINUED] dicyclomine (BENTYL) 20 MG tablet Take 1 tablet (20 mg total) by mouth at bedtime.  . [DISCONTINUED] HYDROcodone-acetaminophen (NORCO) 10-325 MG per tablet Take 1 tablet by mouth 2 (two) times daily. Am & 2pm   No facility-administered encounter medications on file as of 10/31/2014.      Review of Systems  Constitutional: Positive for fatigue.  Respiratory: Negative.   Cardiovascular: Negative.   Neurological: Negative.   Psychiatric/Behavioral: The patient is nervous/anxious.        Objective:   Physical Exam  Constitutional: She is oriented to person, place, and time. She appears well-developed and well-nourished.  She has a pained look on her face throughout course of the visit  Cardiovascular: Normal rate and regular rhythm.   Pulmonary/Chest: Effort normal and breath sounds normal.  Musculoskeletal:  She had a stress fracture in her right leg and has some sort of stimulator that she uses. She  still uses walker and has an appointment next week at [facility] to assess healing  Neurological: She is alert and oriented to person, place, and time.  Psychiatric: She has a normal mood and affect. Her behavior is normal.    Blood pressure 153/81, pulse 78, temperature 97.4 F (36.3 C), temperature source Oral, height  (1.676 m), weight 285 lb (129.275 kg), last menstrual period 08/09/2002. s      Assessment & Plan:  1. Essential hypertension Blood pressure is up slightly today. Will follow  2.  Depression Per Cymbalta with explicit instructions how to do this and begin Lexapro once off the Cymbalta. Recheck in one month  Frederica Kuster MD

## 2014-11-14 ENCOUNTER — Telehealth: Payer: Self-pay | Admitting: Family Medicine

## 2014-11-14 ENCOUNTER — Other Ambulatory Visit: Payer: Self-pay

## 2014-11-14 MED ORDER — MELOXICAM 15 MG PO TABS: ORAL_TABLET | ORAL | Status: DC

## 2014-11-28 ENCOUNTER — Ambulatory Visit: Payer: Medicare Other | Admitting: Family Medicine

## 2014-12-01 ENCOUNTER — Ambulatory Visit (INDEPENDENT_AMBULATORY_CARE_PROVIDER_SITE_OTHER): Payer: Medicare Other | Admitting: Family Medicine

## 2014-12-01 ENCOUNTER — Encounter: Payer: Self-pay | Admitting: Family Medicine

## 2014-12-01 VITALS — BP 158/79 | HR 81 | Temp 97.4°F | Ht 66.0 in | Wt 276.0 lb

## 2014-12-01 DIAGNOSIS — I10 Essential (primary) hypertension: Secondary | ICD-10-CM

## 2014-12-01 DIAGNOSIS — E559 Vitamin D deficiency, unspecified: Secondary | ICD-10-CM | POA: Diagnosis not present

## 2014-12-01 DIAGNOSIS — E785 Hyperlipidemia, unspecified: Secondary | ICD-10-CM

## 2014-12-01 MED ORDER — HYOSCYAMINE SULFATE 0.125 MG SL SUBL: 0.1250 mg | SUBLINGUAL_TABLET | SUBLINGUAL | Status: DC | PRN

## 2014-12-01 MED ORDER — ESCITALOPRAM OXALATE 20 MG PO TABS: 20.0000 mg | ORAL_TABLET | Freq: Every day | ORAL | Status: DC

## 2014-12-01 NOTE — Progress Notes (Signed)
Subjective:    Patient ID: Lauren Harmon, female    DOB: [birth year], 64 y.o.   MRN: [id number]  HPI Pt here for follow up and management of chronic medical problems which includes hypertension and hyperlipidemia. She is taking medications regularly.  She has follow-up appointment with due to neck week about her leg healing. She thinks Lexapro may be helping but she is only on 10 mg and would like to try 20 mg strength. She also complains today of nervous stomach. When asked about this there is no diarrhea or constipation this to feeling of nervousness that awakens her in the mornings. Is relieved by eating.       Patient Active Problem List   Diagnosis Date Noted  . Dysphagia   . Esophageal stenosis   . Anxiety   . GAD (generalized anxiety disorder) 02/11/2013  . Hypertension 09/21/2010  . Hyperlipidemia 09/21/2010  . Osteoarthritis 09/21/2010  . Depression 09/21/2010  . Asthma 09/21/2010  . Obesity 09/21/2010  . Osteopenia 09/21/2010  . Hemorrhoids 09/21/2010  . Lumbar disc disease 09/21/2010  . Esophageal dysmotility 09/21/2010   Outpatient Encounter Prescriptions as of 12/01/2014  Medication Sig  . amLODipine-benazepril (LOTREL) 5-20 MG per capsule TAKE 1 CAPSULE BY MOUTH DAILY.  Marland Kitchen aspirin EC 81 MG tablet Take 81 mg by mouth every morning.   Marland Kitchen atorvastatin (LIPITOR) 40 MG tablet TAKE 1 TABLET (40 MG TOTAL) BY MOUTH DAILY.  . clonazePAM (KLONOPIN) 1 MG tablet Take one at 6 am, 12 noon, 6 pm, and 10 pm  . docusate sodium (COLACE) 100 MG capsule Take 100 mg by mouth daily as needed for mild constipation.   Marland Kitchen escitalopram (LEXAPRO) 10 MG tablet Take one tablet mid morning. Start this medication on 11/07/14 after tapering off of Cymbalta.  Marland Kitchen esomeprazole (NEXIUM) 40 MG capsule Take 1 capsule (40 mg total) by mouth daily.  . furosemide (LASIX) 20 MG tablet Take 1 tablet (20 mg total) by mouth every other day.  Marland Kitchen HYDROcodone-acetaminophen (NORCO) 7.5-325 MG per tablet Take 1  tablet by mouth every 6 (six) hours as needed for moderate pain.  . meloxicam (MOBIC) 15 MG tablet TAKE 1 TABLET (15 MG TOTAL) BY MOUTH DAILY.  . montelukast (SINGULAIR) 10 MG tablet TAKE 1 TABLET (10 MG TOTAL) BY MOUTH AT BEDTIME.  Marland Kitchen Vitamin D, Ergocalciferol, (DRISDOL) 50000 UNITS CAPS capsule Take 1 capsule (50,000 Units total) by mouth every Tuesday.  . [DISCONTINUED] DULoxetine (CYMBALTA) 30 MG capsule Take 1 capsule (30 mg total) by mouth daily.  . [DISCONTINUED] DULoxetine (CYMBALTA) 60 MG capsule    No facility-administered encounter medications on file as of 12/01/2014.     Review of Systems  Constitutional: Negative.   HENT: Negative.   Eyes: Negative.   Respiratory: Negative.   Cardiovascular: Negative.   Gastrointestinal: Negative.   Endocrine: Negative.   Genitourinary: Negative.   Musculoskeletal: Negative.   Skin: Negative.   Allergic/Immunologic: Negative.   Neurological: Negative.   Hematological: Negative.   Psychiatric/Behavioral: The patient is nervous/anxious (feels like the Lexapro is working, but not dtrong enough).        Objective:   Physical Exam  Constitutional: She is oriented to person, place, and time. She appears well-developed and well-nourished.  Cardiovascular: Normal rate and regular rhythm.   Pulmonary/Chest: Effort normal and breath sounds normal.  Abdominal: Soft.  Neurological: She is alert and oriented to person, place, and time.   BP 158/79 mmHg  Pulse 81  Temp(Src) 97.4 F (36.3  C) (Oral)  Ht 5' 6"  (1.676 m)  Wt 276 lb (125.193 kg)  BMI 44.57 kg/m2  LMP 08/09/2002        Assessment & Plan:  1. Essential hypertension Consider increasing the Lotrel if blood pressure remains elevated - CMP14+EGFR  2. Hyperlipidemia LDL was at goal when last checked but that was last year - Lipid panel  3. Vitamin D deficiency Is been on this dose since December. This has not been monitored my knowledge. - Vit D  25 hydroxy (rtn  osteoporosis monitoring)

## 2014-12-01 NOTE — Patient Instructions (Signed)
Medicare Annual Wellness Visit  [facility] and the medical providers at [facility] strive to bring you the best medical care.  In doing so we not only want to address your current medical conditions and concerns but also to detect new conditions early and prevent illness, disease and health-related problems.    Medicare offers a yearly Wellness Visit which allows our clinical staff to assess your need for preventative services including immunizations, lifestyle education, counseling to decrease risk of preventable diseases and screening for fall risk and other medical concerns.    This visit is provided free of charge (no copay) for all Medicare recipients. The clinical pharmacists at [facility] have begun to conduct these Wellness Visits which will also include a thorough review of all your medications.    As you primary medical provider recommend that you make an appointment for your Annual Wellness Visit if you have not done so already this year.  You may set up this appointment before you leave today or you may call back ([phone]) and schedule an appointment.  Please make sure when you call that you mention that you are scheduling your Annual Wellness Visit with the clinical pharmacist so that the appointment may be made for the proper length of time.     Continue current medications. Continue good therapeutic lifestyle changes which include good diet and exercise. Fall precautions discussed with patient. If an FOBT was given today- please return it to our front desk. If you are over 50 years old - you may need Prevnar 13 or the adult Pneumonia vaccine.  Flu Shots are still available at our office. If you still haven't had one please call to set up a nurse visit to get one.   After your visit with us today you will receive a survey in the mail or online from [employer] regarding your care with us. Please take a moment to  fill this out. Your feedback is very important to us as you can help us better understand your patient needs as well as improve your experience and satisfaction. WE CARE ABOUT YOU!!!   

## 2014-12-02 ENCOUNTER — Telehealth: Payer: Self-pay | Admitting: *Deleted

## 2014-12-02 LAB — CMP14+EGFR
ALBUMIN: 3.9 g/dL (ref 3.6–4.8)
ALT: 14 IU/L (ref 0–32)
AST: 15 IU/L (ref 0–40)
Albumin/Globulin Ratio: 1.7 (ref 1.1–2.5)
Alkaline Phosphatase: 62 IU/L (ref 39–117)
BUN/Creatinine Ratio: 7 — ABNORMAL LOW (ref 11–26)
BUN: 7 mg/dL — AB (ref 8–27)
Bilirubin Total: 0.4 mg/dL (ref 0.0–1.2)
CO2: 20 mmol/L (ref 18–29)
Calcium: 9.2 mg/dL (ref 8.7–10.3)
Chloride: 97 mmol/L (ref 97–108)
Creatinine, Ser: 0.99 mg/dL (ref 0.57–1.00)
GFR calc Af Amer: 70 mL/min/{1.73_m2} (ref >59)
GFR calc non Af Amer: 61 mL/min/{1.73_m2} (ref >59)
Globulin, Total: 2.3 g/dL (ref 1.5–4.5)
Glucose: 103 mg/dL — ABNORMAL HIGH (ref 65–99)
POTASSIUM: 4.1 mmol/L (ref 3.5–5.2)
Sodium: 136 mmol/L (ref 134–144)
TOTAL PROTEIN: 6.2 g/dL (ref 6.0–8.5)

## 2014-12-02 LAB — LIPID PANEL
Chol/HDL Ratio: 1.7 ratio units (ref 0.0–4.4)
Cholesterol, Total: 134 mg/dL (ref 100–199)
HDL: 77 mg/dL (ref >39)
LDL CALC: 46 mg/dL (ref 0–99)
Triglycerides: 56 mg/dL (ref 0–149)
VLDL Cholesterol Cal: 11 mg/dL (ref 5–40)

## 2014-12-02 LAB — VITAMIN D 25 HYDROXY (VIT D DEFICIENCY, FRACTURES): Vit D, 25-Hydroxy: 40.4 ng/mL (ref 30.0–100.0)

## 2014-12-02 NOTE — Telephone Encounter (Signed)
-----   Message from Frederica KusterStephen M Miller, MD sent at 12/02/2014  7:50 AM EDT ----- Vitamin D level is in the therapeutic range I would suggest she could go back to taking maintenance of 1004 2000 units per day; glucose is mildly elevated in the prediabetes range she has normal renal function and her lipids showed good LDL as well as HDL and triglycerides and ratios are excellent

## 2014-12-04 ENCOUNTER — Other Ambulatory Visit: Payer: Self-pay | Admitting: Family Medicine

## 2014-12-04 NOTE — Telephone Encounter (Signed)
Please call in klonopin with 1 refills Patient NTBS for follow up and lab work

## 2014-12-04 NOTE — Telephone Encounter (Signed)
Last seen 12/01/14  Dr Hyacinth Meeker  If approved route to nurse to [facility] into [facility]

## 2014-12-22 ENCOUNTER — Other Ambulatory Visit: Payer: Self-pay | Admitting: Family Medicine

## 2014-12-24 ENCOUNTER — Telehealth: Payer: Self-pay | Admitting: Family Medicine

## 2014-12-24 NOTE — Telephone Encounter (Signed)
Pt states Levsin is not helping her, instructed that she can take 1-2 tablets every 4 hours as needed. She mainly just takes this at night so she will try taking 2 at night for the next couple nights then will let us know how this works.

## 2014-12-29 DIAGNOSIS — M179 Osteoarthritis of knee, unspecified: Secondary | ICD-10-CM | POA: Diagnosis not present

## 2014-12-29 DIAGNOSIS — Z967 Presence of other bone and tendon implants: Secondary | ICD-10-CM | POA: Diagnosis not present

## 2014-12-29 DIAGNOSIS — Z8781 Personal history of (healed) traumatic fracture: Secondary | ICD-10-CM | POA: Diagnosis not present

## 2014-12-29 DIAGNOSIS — S72431D Displaced fracture of medial condyle of right femur, subsequent encounter for closed fracture with routine healing: Secondary | ICD-10-CM | POA: Diagnosis not present

## 2014-12-29 DIAGNOSIS — Z743 Need for continuous supervision: Secondary | ICD-10-CM | POA: Diagnosis not present

## 2014-12-29 DIAGNOSIS — R279 Unspecified lack of coordination: Secondary | ICD-10-CM | POA: Diagnosis not present

## 2014-12-29 DIAGNOSIS — R531 Weakness: Secondary | ICD-10-CM | POA: Diagnosis not present

## 2015-01-07 ENCOUNTER — Telehealth: Payer: Self-pay | Admitting: Family Medicine

## 2015-01-22 ENCOUNTER — Other Ambulatory Visit: Payer: Self-pay | Admitting: Family Medicine

## 2015-01-22 ENCOUNTER — Telehealth: Payer: Self-pay | Admitting: Family Medicine

## 2015-01-22 NOTE — Telephone Encounter (Signed)
Appointment given for 9/13 @ 8:15 with Hyacinth Meeker

## 2015-01-27 ENCOUNTER — Encounter: Payer: Self-pay | Admitting: Family Medicine

## 2015-01-27 ENCOUNTER — Ambulatory Visit (INDEPENDENT_AMBULATORY_CARE_PROVIDER_SITE_OTHER): Payer: Medicare Other | Admitting: Family Medicine

## 2015-01-27 VITALS — BP 138/71 | HR 68 | Temp 97.5°F | Ht 66.0 in | Wt 275.0 lb

## 2015-01-27 DIAGNOSIS — F32A Depression, unspecified: Secondary | ICD-10-CM

## 2015-01-27 DIAGNOSIS — F329 Major depressive disorder, single episode, unspecified: Secondary | ICD-10-CM

## 2015-01-27 DIAGNOSIS — F419 Anxiety disorder, unspecified: Secondary | ICD-10-CM

## 2015-01-27 NOTE — Progress Notes (Signed)
Subjective:    Patient ID: Lauren Harmon, female    DOB: 1950-11-26, 64 y.o.   MRN: [id number]  HPI  64 year old female here to follow-up anxiety depression irritable bowel syndrome and hypertension. All of our conversation today centered around her nervous problems which is not unusual. We have now tried her on 4 antidepressives none of which help her according to her history. She also has significant anxiety and takes Klonopin 1 mg 4 times a day. She says that she is not happy and just generally feels bad. We talked about expectations of what antidepressives might do and I'm not sure that her expectations are realistic. She has had a difficult time with taking medicines understanding how to do that in the past. She has ongoing issues with her right leg where she had a fracture. [facility]. She is not currently undergoing any physical therapy but uses a walker to help with ambulation   Patient Active Problem List   Diagnosis Date Noted  . Dysphagia   . Esophageal stenosis   . Anxiety   . GAD (generalized anxiety disorder) 02/11/2013  . Hypertension 09/21/2010  . Hyperlipidemia 09/21/2010  . Osteoarthritis 09/21/2010  . Depression 09/21/2010  . Asthma 09/21/2010  . Obesity 09/21/2010  . Osteopenia 09/21/2010  . Hemorrhoids 09/21/2010  . Lumbar disc disease 09/21/2010  . Esophageal dysmotility 09/21/2010   Outpatient Encounter Prescriptions as of 01/27/2015  Medication Sig  . Cholecalciferol (VITAMIN D) 2000 UNITS CAPS Take 1 capsule by mouth daily.  Marland Kitchen amLODipine-benazepril (LOTREL) 5-20 MG per capsule TAKE 1 CAPSULE BY MOUTH DAILY.  Marland Kitchen aspirin EC 81 MG tablet Take 81 mg by mouth every morning.   Marland Kitchen atorvastatin (LIPITOR) 40 MG tablet TAKE 1 TABLET (40 MG TOTAL) BY MOUTH DAILY.  . clonazePAM (KLONOPIN) 1 MG tablet TAKE 1 TABLET AT 6AM, 12 NOON, 6PM, AND 10PM  . docusate sodium (COLACE) 100 MG capsule Take 100 mg by mouth daily as needed for mild constipation.   Marland Kitchen  escitalopram (LEXAPRO) 20 MG tablet Take 1 tablet (20 mg total) by mouth daily.  Marland Kitchen esomeprazole (NEXIUM) 40 MG capsule Take 1 capsule (40 mg total) by mouth daily.  . furosemide (LASIX) 20 MG tablet Take 1 tablet (20 mg total) by mouth every other day.  Marland Kitchen HYDROcodone-acetaminophen (NORCO) 7.5-325 MG per tablet Take 1 tablet by mouth every 6 (six) hours as needed for moderate pain.  . hyoscyamine (LEVSIN SL) 0.125 MG SL tablet PLACE 1 TABLET (0.125 MG TOTAL) UNDER THE TONGUE EVERY 4 (FOUR) HOURS AS NEEDED.  Marland Kitchen meloxicam (MOBIC) 15 MG tablet TAKE 1 TABLET (15 MG TOTAL) BY MOUTH DAILY.  . montelukast (SINGULAIR) 10 MG tablet TAKE 1 TABLET (10 MG TOTAL) BY MOUTH AT BEDTIME.  . [DISCONTINUED] escitalopram (LEXAPRO) 10 MG tablet Take one tablet mid morning. Start this medication on 11/07/14 after tapering off of Cymbalta.  . [DISCONTINUED] Vitamin D, Ergocalciferol, (DRISDOL) 50000 UNITS CAPS capsule Take 1 capsule (50,000 Units total) by mouth every Tuesday.   No facility-administered encounter medications on file as of 01/27/2015.     Review of Systems  Constitutional: Positive for fatigue.  Respiratory: Negative.   Cardiovascular: Negative.   Gastrointestinal: Positive for abdominal pain.  Genitourinary: Negative.   Psychiatric/Behavioral: The patient is nervous/anxious.        Objective:   Physical Exam  Constitutional: She is oriented to person, place, and time. She appears well-developed and well-nourished.  Cardiovascular: Normal rate and regular rhythm.  Pulmonary/Chest: Effort normal and breath sounds normal.  Neurological: She is alert and oriented to person, place, and time.  Psychiatric: She has a normal mood and affect. Her behavior is normal.      BP 138/71 mmHg  Pulse 68  Temp(Src) 97.5 F (36.4 C) (Oral)  Ht 5\' 6"  (1.676 m)  Wt 275 lb (124.739 kg)  BMI 44.41 kg/m2  LMP 08/09/2002     .Assessment & Plan:  1. Depression Will taper her off Lexapro and increased dose  of Klonopin to 1.5 mg 4 times a day. After washout. If she still feels like she needs it antidepressives will try low-dose Abilify  2. Anxiety As stated above increase the Klonopin to 1.5 mg 4 times a day as needed  Frederica Kuster MD

## 2015-02-01 ENCOUNTER — Other Ambulatory Visit: Payer: Self-pay | Admitting: Family Medicine

## 2015-02-02 ENCOUNTER — Telehealth: Payer: Self-pay | Admitting: Family Medicine

## 2015-02-02 NOTE — Telephone Encounter (Signed)
Sent this to Dr Buel Ream nurse

## 2015-02-02 NOTE — Telephone Encounter (Signed)
Millers pt. Last filled 01/05/15, last seen 01/27/15. [facility] and call pt when done

## 2015-02-03 ENCOUNTER — Other Ambulatory Visit: Payer: Self-pay | Admitting: Family Medicine

## 2015-02-04 DIAGNOSIS — M25562 Pain in left knee: Secondary | ICD-10-CM | POA: Diagnosis not present

## 2015-02-04 DIAGNOSIS — I10 Essential (primary) hypertension: Secondary | ICD-10-CM | POA: Diagnosis not present

## 2015-02-04 DIAGNOSIS — M1712 Unilateral primary osteoarthritis, left knee: Secondary | ICD-10-CM | POA: Diagnosis not present

## 2015-02-10 ENCOUNTER — Encounter: Payer: Self-pay | Admitting: *Deleted

## 2015-02-19 ENCOUNTER — Other Ambulatory Visit: Payer: Self-pay | Admitting: *Deleted

## 2015-02-19 ENCOUNTER — Telehealth: Payer: Self-pay | Admitting: Family Medicine

## 2015-02-19 MED ORDER — CLONAZEPAM 1 MG PO TABS: 1.5000 mg | ORAL_TABLET | Freq: Four times a day (QID) | ORAL | Status: DC | PRN

## 2015-02-19 NOTE — Telephone Encounter (Signed)
Patient should have been given 180 tablets to last for 1 month rather than 120 which she was given so we need to give her 60 more until October 19 and then refill at 180

## 2015-02-20 NOTE — Telephone Encounter (Signed)
rx sent to pharmacy will close encounter.

## 2015-02-24 ENCOUNTER — Telehealth: Payer: Self-pay

## 2015-02-24 NOTE — Telephone Encounter (Signed)
Insurance denied quantity of Clonazepam   1 mg 4 allowed per day

## 2015-02-24 NOTE — Telephone Encounter (Signed)
We have to abide by insurance decision these days

## 2015-02-25 ENCOUNTER — Other Ambulatory Visit: Payer: Self-pay

## 2015-02-25 ENCOUNTER — Telehealth: Payer: Self-pay | Admitting: *Deleted

## 2015-02-25 MED ORDER — CLONAZEPAM 1 MG PO TABS: 1.0000 mg | ORAL_TABLET | Freq: Four times a day (QID) | ORAL | Status: DC | PRN

## 2015-02-25 NOTE — Telephone Encounter (Signed)
Insurance denied 6 pills a day so Dr Hyacinth MeekerMiller responded that we need to go by what insurance said so I set this up as 4 pills a day which is what they will pay for   If approved route to nurse [facility]

## 2015-02-25 NOTE — Telephone Encounter (Signed)
Dr Hyacinth MeekerMiller -  i called [facility] and approved their request for pt's clonazepam.  But then pharmacist called and said that you changed the order last month to be 1 and 1/2 QID -- the rx says 1 QID.  Please address on Tuesday when you are back -- pharm says she has enough to last her until then.

## 2015-03-02 ENCOUNTER — Other Ambulatory Visit: Payer: Self-pay | Admitting: Family Medicine

## 2015-03-03 ENCOUNTER — Ambulatory Visit: Payer: Medicare Other | Admitting: Family Medicine

## 2015-03-03 ENCOUNTER — Telehealth: Payer: Self-pay | Admitting: Family Medicine

## 2015-03-03 MED ORDER — CLONAZEPAM 1 MG PO TABS: 1.5000 mg | ORAL_TABLET | Freq: Four times a day (QID) | ORAL | Status: DC | PRN

## 2015-03-03 NOTE — Telephone Encounter (Signed)
Just filled 02/23/15, but pt can take up to 6 a day. She was given 60, which is a 10 day supply. Call a different [facility] at [facility]

## 2015-03-03 NOTE — Telephone Encounter (Signed)
Cathy done

## 2015-03-16 ENCOUNTER — Telehealth: Payer: Self-pay | Admitting: Family Medicine

## 2015-03-16 ENCOUNTER — Other Ambulatory Visit: Payer: Self-pay | Admitting: Family Medicine

## 2015-03-16 NOTE — Telephone Encounter (Signed)
[facility]. Refill of only #12 clonazepam given until pt comes in for her appt on 11/2. So that a correct written Rx or possible change of medication is given to patient.

## 2015-03-18 ENCOUNTER — Encounter: Payer: Self-pay | Admitting: Family Medicine

## 2015-03-18 ENCOUNTER — Ambulatory Visit (INDEPENDENT_AMBULATORY_CARE_PROVIDER_SITE_OTHER): Payer: Medicare Other | Admitting: Family Medicine

## 2015-03-18 VITALS — BP 156/79 | HR 66 | Temp 98.4°F | Ht 66.0 in | Wt 277.0 lb

## 2015-03-18 DIAGNOSIS — I10 Essential (primary) hypertension: Secondary | ICD-10-CM

## 2015-03-18 DIAGNOSIS — R3915 Urgency of urination: Secondary | ICD-10-CM | POA: Diagnosis not present

## 2015-03-18 DIAGNOSIS — F329 Major depressive disorder, single episode, unspecified: Secondary | ICD-10-CM | POA: Diagnosis not present

## 2015-03-18 DIAGNOSIS — F419 Anxiety disorder, unspecified: Secondary | ICD-10-CM

## 2015-03-18 DIAGNOSIS — F32A Depression, unspecified: Secondary | ICD-10-CM

## 2015-03-18 LAB — POCT URINALYSIS DIPSTICK
Bilirubin, UA: NEGATIVE
Glucose, UA: NEGATIVE
Ketones, UA: NEGATIVE
Leukocytes, UA: NEGATIVE
Nitrite, UA: NEGATIVE
PROTEIN UA: NEGATIVE
SPEC GRAV UA: 1.01
UROBILINOGEN UA: NEGATIVE
pH, UA: 7.5

## 2015-03-18 LAB — POCT UA - MICROSCOPIC ONLY
CASTS, UR, LPF, POC: NEGATIVE
CRYSTALS, UR, HPF, POC: NEGATIVE
Mucus, UA: NEGATIVE
Yeast, UA: NEGATIVE

## 2015-03-18 MED ORDER — CLONAZEPAM 2 MG PO TABS: ORAL_TABLET | ORAL | Status: DC

## 2015-03-18 MED ORDER — VILAZODONE HCL 10 & 20 & 40 MG PO KIT: PACK | ORAL | Status: DC

## 2015-03-18 NOTE — Progress Notes (Signed)
Subjective:    Patient ID: Lauren Harmon, female    DOB: [birth year], 64 y.o.   MRN: [id number]  HPI 64 year old female with chronic complaints of anxiety, depression, and fatigue. She continues to gain weight. Although reviewing her weight she has excellent Thurmond Butts more in the past probably when she wasn't moving around with her broken leg and rehabilitation. There is a big issue has been getting her anti-anxiety agent. Her insurance will only pay for 4 tablets a day and she has been taking 6 tablets of the 1 mg clonazepam. We have tried multiple antidepressants to which she has a side effect. I think depression is probably the biggest cause of her not feeling well and fatigue but it has been difficult to find a medicine she tolerates.  Patient Active Problem List   Diagnosis Date Noted  . Dysphagia   . Esophageal stenosis   . Anxiety   . GAD (generalized anxiety disorder) 02/11/2013  . Hypertension 09/21/2010  . Hyperlipidemia 09/21/2010  . Osteoarthritis 09/21/2010  . Depression 09/21/2010  . Asthma 09/21/2010  . Obesity 09/21/2010  . Osteopenia 09/21/2010  . Hemorrhoids 09/21/2010  . Lumbar disc disease 09/21/2010  . Esophageal dysmotility 09/21/2010   Outpatient Encounter Prescriptions as of 03/18/2015  Medication Sig  . amLODipine-benazepril (LOTREL) 5-20 MG per capsule TAKE 1 CAPSULE BY MOUTH DAILY.  Marland Kitchen aspirin EC 81 MG tablet Take 81 mg by mouth every morning.   Marland Kitchen atorvastatin (LIPITOR) 40 MG tablet TAKE 1 TABLET (40 MG TOTAL) BY MOUTH DAILY.  Marland Kitchen Cholecalciferol (VITAMIN D) 2000 UNITS CAPS Take 1 capsule by mouth daily.  . clonazePAM (KLONOPIN) 1 MG tablet TAKE 1 AND 1/2 TABLETS 4 TIMES A DAY AS NEEDED  . docusate sodium (COLACE) 100 MG capsule Take 100 mg by mouth daily as needed for mild constipation.   Marland Kitchen escitalopram (LEXAPRO) 20 MG tablet Take 1 tablet (20 mg total) by mouth daily.  Marland Kitchen esomeprazole (NEXIUM) 40 MG capsule Take 1 capsule (40 mg total) by mouth daily.  .  furosemide (LASIX) 20 MG tablet Take 1 tablet (20 mg total) by mouth every other day.  Marland Kitchen HYDROcodone-acetaminophen (NORCO) 7.5-325 MG per tablet Take 1 tablet by mouth every 6 (six) hours as needed for moderate pain.  . hyoscyamine (LEVSIN SL) 0.125 MG SL tablet PLACE 1 TABLET (0.125 MG TOTAL) UNDER THE TONGUE EVERY 4 (FOUR) HOURS AS NEEDED.  Marland Kitchen meloxicam (MOBIC) 15 MG tablet TAKE 1 TABLET (15 MG TOTAL) BY MOUTH DAILY.  . montelukast (SINGULAIR) 10 MG tablet TAKE 1 TABLET (10 MG TOTAL) BY MOUTH AT BEDTIME.  . [DISCONTINUED] clonazePAM (KLONOPIN) 1 MG tablet Take 1.5 tablets (1.5 mg total) by mouth 4 (four) times daily as needed for anxiety.   No facility-administered encounter medications on file as of 03/18/2015.      Review of Systems  Constitutional: Positive for fatigue.  Respiratory: Negative.   Cardiovascular: Positive for palpitations.  Genitourinary: Positive for dysuria and urgency.  Neurological: Negative.   Psychiatric/Behavioral: Positive for decreased concentration. The patient is nervous/anxious.        Objective:   Physical Exam  Constitutional: She is oriented to person, place, and time. She appears well-developed and well-nourished.  Cardiovascular: Normal rate, regular rhythm and normal heart sounds.   Pulmonary/Chest: Effort normal and breath sounds normal.  Neurological: She is alert and oriented to person, place, and time.  Psychiatric: She has a normal mood and affect.          Assessment &  Plan:  1. Urinary urgency Urine has red blood cells and will be cultured - POCT UA - Microscopic Only   2. Essential hypertension Blood pressure exhibiting fair control. She is on amlodipine and benazepril  3. Depression Given samples of vibryd.  I will recheck her in one month  4. Anxiety In an effort to coordinate her insurance coverage with her anxieties will increase clonazepam to 2 mg and take that 3 times a day rather than 1/2 mg 4 times a day. Hopefully  this will provide some relief urinalysis dipstick  Frederica KusterStephen M Doreena Maulden MD

## 2015-03-23 ENCOUNTER — Other Ambulatory Visit: Payer: Self-pay | Admitting: *Deleted

## 2015-03-23 MED ORDER — ESOMEPRAZOLE MAGNESIUM 40 MG PO CPDR: 40.0000 mg | DELAYED_RELEASE_CAPSULE | Freq: Every day | ORAL | Status: DC

## 2015-03-24 ENCOUNTER — Ambulatory Visit: Payer: Medicare Other | Admitting: Family Medicine

## 2015-04-02 ENCOUNTER — Telehealth: Payer: Self-pay | Admitting: Family Medicine

## 2015-04-02 MED ORDER — VILAZODONE HCL 20 MG PO TABS: 20.0000 mg | ORAL_TABLET | Freq: Every day | ORAL | Status: DC

## 2015-04-02 NOTE — Telephone Encounter (Signed)
Stp and she just needed her Viibrid sent in to the pharmacy.

## 2015-04-06 ENCOUNTER — Telehealth: Payer: Self-pay | Admitting: Family Medicine

## 2015-04-08 MED ORDER — VILAZODONE HCL 40 MG PO TABS: 40.0000 mg | ORAL_TABLET | Freq: Every day | ORAL | Status: DC

## 2015-04-08 NOTE — Telephone Encounter (Signed)
Aware,vibryd 40 mg ,sent to pharmacy.

## 2015-04-08 NOTE — Telephone Encounter (Signed)
Note routed to Dr. Hyacinth MeekerMiller

## 2015-04-10 ENCOUNTER — Encounter (HOSPITAL_COMMUNITY): Payer: Self-pay | Admitting: *Deleted

## 2015-04-10 ENCOUNTER — Emergency Department (HOSPITAL_COMMUNITY): Payer: Medicare Other

## 2015-04-10 ENCOUNTER — Emergency Department (HOSPITAL_COMMUNITY)
Admission: EM | Admit: 2015-04-10 | Discharge: 2015-04-10 | Disposition: A | Discharge status: Home / Self Care | Payer: Medicare Other | Attending: Emergency Medicine | Admitting: Emergency Medicine

## 2015-04-10 DIAGNOSIS — J45909 Unspecified asthma, uncomplicated: Secondary | ICD-10-CM | POA: Insufficient documentation

## 2015-04-10 DIAGNOSIS — M1990 Unspecified osteoarthritis, unspecified site: Secondary | ICD-10-CM | POA: Diagnosis not present

## 2015-04-10 DIAGNOSIS — K219 Gastro-esophageal reflux disease without esophagitis: Secondary | ICD-10-CM | POA: Insufficient documentation

## 2015-04-10 DIAGNOSIS — K279 Peptic ulcer, site unspecified, unspecified as acute or chronic, without hemorrhage or perforation: Secondary | ICD-10-CM | POA: Insufficient documentation

## 2015-04-10 DIAGNOSIS — R1084 Generalized abdominal pain: Secondary | ICD-10-CM | POA: Diagnosis not present

## 2015-04-10 DIAGNOSIS — K273 Acute peptic ulcer, site unspecified, without hemorrhage or perforation: Secondary | ICD-10-CM | POA: Diagnosis not present

## 2015-04-10 DIAGNOSIS — F419 Anxiety disorder, unspecified: Secondary | ICD-10-CM | POA: Diagnosis not present

## 2015-04-10 DIAGNOSIS — Z7982 Long term (current) use of aspirin: Secondary | ICD-10-CM | POA: Insufficient documentation

## 2015-04-10 DIAGNOSIS — F329 Major depressive disorder, single episode, unspecified: Secondary | ICD-10-CM | POA: Diagnosis not present

## 2015-04-10 DIAGNOSIS — I10 Essential (primary) hypertension: Secondary | ICD-10-CM | POA: Insufficient documentation

## 2015-04-10 DIAGNOSIS — Z791 Long term (current) use of non-steroidal anti-inflammatories (NSAID): Secondary | ICD-10-CM | POA: Diagnosis not present

## 2015-04-10 DIAGNOSIS — E7800 Pure hypercholesterolemia, unspecified: Secondary | ICD-10-CM | POA: Insufficient documentation

## 2015-04-10 DIAGNOSIS — R450 Nervousness: Secondary | ICD-10-CM | POA: Diagnosis not present

## 2015-04-10 DIAGNOSIS — R110 Nausea: Secondary | ICD-10-CM | POA: Diagnosis not present

## 2015-04-10 DIAGNOSIS — Z79899 Other long term (current) drug therapy: Secondary | ICD-10-CM | POA: Insufficient documentation

## 2015-04-10 LAB — CBC WITH DIFFERENTIAL/PLATELET
BASOS ABS: 0 10*3/uL (ref 0.0–0.1)
BASOS PCT: 0 %
Eosinophils Absolute: 0 10*3/uL (ref 0.0–0.7)
Eosinophils Relative: 0 %
HEMATOCRIT: 38.1 % (ref 36.0–46.0)
Hemoglobin: 12.8 g/dL (ref 12.0–15.0)
LYMPHS PCT: 18 %
Lymphs Abs: 0.9 10*3/uL (ref 0.7–4.0)
MCH: 28.6 pg (ref 26.0–34.0)
MCHC: 33.6 g/dL (ref 30.0–36.0)
MCV: 85.2 fL (ref 78.0–100.0)
MONO ABS: 0.5 10*3/uL (ref 0.1–1.0)
Monocytes Relative: 9 %
NEUTROS ABS: 3.6 10*3/uL (ref 1.7–7.7)
Neutrophils Relative %: 73 %
PLATELETS: 173 10*3/uL (ref 150–400)
RBC: 4.47 MIL/uL (ref 3.87–5.11)
RDW: 14.1 % (ref 11.5–15.5)
WBC: 5.1 10*3/uL (ref 4.0–10.5)

## 2015-04-10 LAB — URINALYSIS, ROUTINE W REFLEX MICROSCOPIC
Bilirubin Urine: NEGATIVE
GLUCOSE, UA: NEGATIVE mg/dL
HGB URINE DIPSTICK: NEGATIVE
Ketones, ur: NEGATIVE mg/dL
LEUKOCYTES UA: NEGATIVE
Nitrite: NEGATIVE
PROTEIN: NEGATIVE mg/dL
pH: 7 (ref 5.0–8.0)

## 2015-04-10 LAB — COMPREHENSIVE METABOLIC PANEL
ALBUMIN: 3.6 g/dL (ref 3.5–5.0)
ALT: 15 U/L (ref 14–54)
AST: 15 U/L (ref 15–41)
Alkaline Phosphatase: 50 U/L (ref 38–126)
Anion gap: 8 (ref 5–15)
BILIRUBIN TOTAL: 0.6 mg/dL (ref 0.3–1.2)
BUN: 14 mg/dL (ref 6–20)
CHLORIDE: 101 mmol/L (ref 101–111)
CO2: 28 mmol/L (ref 22–32)
CREATININE: 0.99 mg/dL (ref 0.44–1.00)
Calcium: 9.1 mg/dL (ref 8.9–10.3)
GFR calc Af Amer: >60 mL/min (ref >60)
GFR calc non Af Amer: 59 mL/min — ABNORMAL LOW (ref >60)
GLUCOSE: 100 mg/dL — AB (ref 65–99)
POTASSIUM: 4.2 mmol/L (ref 3.5–5.1)
Sodium: 137 mmol/L (ref 135–145)
Total Protein: 6.4 g/dL — ABNORMAL LOW (ref 6.5–8.1)

## 2015-04-10 LAB — LIPASE, BLOOD: LIPASE: 21 U/L (ref 11–51)

## 2015-04-10 NOTE — ED Notes (Signed)
MD at bedside. 

## 2015-04-10 NOTE — ED Notes (Signed)
[facility]

## 2015-04-10 NOTE — ED Notes (Signed)
Pt wanting to ask MD questions. MD notified and is at bedside.  Pt states "yall havent done anything for me, and i want to be transferred to a different hospital."  MD explained to pt the circumstances, MD explained to pt that he needed to take morning meds.  Pt given crackers and water to take medications.  Pt given phone to call ride.

## 2015-04-10 NOTE — ED Notes (Signed)
Pt back into bed after voiding.

## 2015-04-10 NOTE — ED Provider Notes (Signed)
CSN: [id number]     Arrival date & time 04/10/15  [id number] History   First MD Initiated Contact with Patient 04/10/15 0700     Chief Complaint  Patient presents with  . Nausea     (Consider location/radiation/quality/duration/timing/severity/associated sxs/prior Treatment) Patient is a 64 y.o. female presenting with abdominal pain. The history is provided by the patient (The patient states that every morning she wakes up at 4:00 AM with her stomach bothering her. She has a history of anxiety.).  Abdominal Pain Pain location:  Epigastric Pain quality: aching   Pain radiates to:  Does not radiate Pain severity:  Moderate Onset quality:  Gradual Timing:  Intermittent Progression:  Waxing and waning Chronicity:  New Context: awakening from sleep   Associated symptoms: no chest pain, no cough, no diarrhea, no fatigue and no hematuria     Past Medical History  Diagnosis Date  . Anxiety   . H/O: knee surgery   . Hypertension   . Allergy   . Depression   . Asthma   . Osteoarthritis   . Palpitations   . Osteopenia   . Hemorrhoid   . Panic attack   . Hypercholesteremia   . GERD (gastroesophageal reflux disease)    Past Surgical History  Procedure Laterality Date  . Eye surgery    . Knee surgery    . Esophagogastroduodenoscopy N/A 07/02/2014    Procedure: ESOPHAGOGASTRODUODENOSCOPY (EGD);  Surgeon: Iva Booparl E Gessner, MD;  [facility];  Service: Endoscopy;  Laterality: N/A;  with possible dilation or botox  . Balloon dilation N/A 07/02/2014    Procedure: BALLOON DILATION;  Surgeon: Iva Booparl E Gessner, MD;  Location: [facility];  Service: Endoscopy;  Laterality: N/A;  . Botox injection N/A 07/02/2014    Procedure: BOTOX INJECTION;  Surgeon: Iva Booparl E Gessner, MD;  Location: [facility];  Service: Endoscopy;  Laterality: N/A;   Family History  Problem Relation Age of Onset  . Osteoarthritis Mother   . Hypertension Mother   . Hypertension Father   . Hyperlipidemia Father   .  Heart disease Father   . Osteoarthritis Father   . Colon cancer Father   . Lung cancer Maternal Uncle     smoker   Social History  Substance Use Topics  . Smoking status: Never Smoker   . Smokeless tobacco: Never Used  . Alcohol Use: No   OB History    No data available     Review of Systems  Constitutional: Negative for appetite change and fatigue.  HENT: Negative for congestion, ear discharge and sinus pressure.   Eyes: Negative for discharge.  Respiratory: Negative for cough.   Cardiovascular: Negative for chest pain.  Gastrointestinal: Positive for abdominal pain. Negative for diarrhea.  Genitourinary: Negative for frequency and hematuria.  Musculoskeletal: Negative for back pain.  Skin: Negative for rash.  Neurological: Negative for seizures and headaches.  Psychiatric/Behavioral: Negative for hallucinations.      Allergies  Wellbutrin  Home Medications   Prior to Admission medications   Medication Sig Start Date End Date Taking? Authorizing Provider  amLODipine-benazepril (LOTREL) 5-20 MG per capsule TAKE 1 CAPSULE BY MOUTH DAILY. 01/22/15   Frederica KusterStephen M Miller, MD  aspirin EC 81 MG tablet Take 81 mg by mouth every morning.     Historical Provider, MD  atorvastatin (LIPITOR) 40 MG tablet TAKE 1 TABLET (40 MG TOTAL) BY MOUTH DAILY. 01/22/15   Frederica KusterStephen M Miller, MD  Cholecalciferol (VITAMIN D) 2000 UNITS CAPS Take 1 capsule by mouth  daily.    Historical Provider, MD  clonazePAM (KLONOPIN) 2 MG tablet Take 1 tablet at 6 am, 1 tablet at 2 pm then 1 tablet at 9 pm 03/18/15   Frederica Kuster, MD  docusate sodium (COLACE) 100 MG capsule Take 100 mg by mouth daily as needed for mild constipation.     Historical Provider, MD  escitalopram (LEXAPRO) 20 MG tablet Take 1 tablet (20 mg total) by mouth daily. 12/01/14   Frederica Kuster, MD  esomeprazole (NEXIUM) 40 MG capsule Take 1 capsule (40 mg total) by mouth daily. 03/23/15   Frederica Kuster, MD  furosemide (LASIX) 20 MG tablet  Take 1 tablet (20 mg total) by mouth every other day. 07/02/14   Iva Boop, MD  HYDROcodone-acetaminophen (NORCO) 7.5-325 MG per tablet Take 1 tablet by mouth every 6 (six) hours as needed for moderate pain.    Historical Provider, MD  hyoscyamine (LEVSIN SL) 0.125 MG SL tablet PLACE 1 TABLET (0.125 MG TOTAL) UNDER THE TONGUE EVERY 4 (FOUR) HOURS AS NEEDED. 12/23/14   Frederica Kuster, MD  meloxicam (MOBIC) 15 MG tablet TAKE 1 TABLET (15 MG TOTAL) BY MOUTH DAILY. 02/04/15   Frederica Kuster, MD  montelukast (SINGULAIR) 10 MG tablet TAKE 1 TABLET (10 MG TOTAL) BY MOUTH AT BEDTIME. 10/28/14   Frederica Kuster, MD  Vilazodone HCl (VIIBRYD) 40 MG TABS Take 1 tablet (40 mg total) by mouth daily. 04/08/15   Frederica Kuster, MD   BP 117/96 mmHg  Pulse 69  Temp(Src) 97.7 F (36.5 C)  Resp 20  Ht  (1.676 m)  Wt 277 lb (125.646 kg)  BMI 44.73 kg/m2  SpO2 100%  LMP 08/09/2002 Physical Exam  Constitutional: She is oriented to person, place, and time. She appears well-developed.  HENT:  Head: Normocephalic.  Eyes: Conjunctivae and EOM are normal. No scleral icterus.  Neck: Neck supple. No thyromegaly present.  Cardiovascular: Normal rate and regular rhythm.  Exam reveals no gallop and no friction rub.   No murmur heard. Pulmonary/Chest: No stridor. She has no wheezes. She has no rales. She exhibits no tenderness.  Abdominal: She exhibits no distension. There is no tenderness. There is no rebound.  Musculoskeletal: Normal range of motion. She exhibits no edema.  Lymphadenopathy:    She has no cervical adenopathy.  Neurological: She is oriented to person, place, and time. She exhibits normal muscle tone. Coordination normal.  Skin: No rash noted. No erythema.  Psychiatric: She has a normal mood and affect. Her behavior is normal.    ED Course  Procedures (including critical care time) Labs Review Labs Reviewed  COMPREHENSIVE METABOLIC PANEL - Abnormal; Notable for the following:     Glucose, Bld 100 (*)    Total Protein 6.4 (*)    GFR calc non Af Amer 59 (*)    All other components within normal limits  URINALYSIS, ROUTINE W REFLEX MICROSCOPIC (NOT AT [facility]) - Abnormal; Notable for the following:    Specific Gravity, Urine <1.005 (*)    All other components within normal limits  CBC WITH DIFFERENTIAL/PLATELET  LIPASE, BLOOD    Imaging Review Dg Abd Acute W/chest  04/10/2015  CLINICAL DATA:  Nausea. Heart racing for several months. Progressive symptoms. Nausea. EXAM: DG ABDOMEN ACUTE W/ 1V CHEST COMPARISON:  Two-view chest x-ray 04/09/2014. Acute abdominal series 08/15/2012 FINDINGS: The heart size is normal. Mild pulmonary vascular congestion is present. No focal airspace disease is present. Supine and sitting views  the abdomen demonstrating normal bowel gas pattern. There is no evidence for obstruction or free air. Progressive degenerative changes are present in the lower lumbar spine. IMPRESSION: 1. No acute cardiopulmonary or abdominal abnormality. 2. Mild pulmonary vascular congestion. 3. Progressive degenerative changes within the lower lumbar spine. Electronically Signed   By: Marin Roberts M.D.   On: 04/10/2015 08:12   I have personally reviewed and evaluated these images and lab results as part of my medical decision-making.   EKG Interpretation   Date/Time:  Friday April 10 2015 07:20:25 EST Ventricular Rate:  69 PR Interval:  154 QRS Duration: 96 QT Interval:  385 QTC Calculation: 412 R Axis:   5 Text Interpretation:  Sinus rhythm Confirmed by Tonia Avino  MD, Yitzel Shasteen ([id number])  on 04/10/2015 8:56:08 AM      MDM   Final diagnoses:  PUD (peptic ulcer disease)  Anxiety    Labs and x-rays unremarkable. Suspect patient's stomach symptoms are anxiety related. She is on Clonopin and an antidepressant. Patient is also on Nexium to be taken once in the morning. I instructed her to increase the Nexium to twice a day. Symptoms are possibly peptic ulcer  disease. More likely the symptoms are anxiety. Patient was referred to GI for further evaluation    Bethann Berkshire, MD 04/10/15 [phone]

## 2015-04-10 NOTE — ED Notes (Signed)
Pt used [facility].

## 2015-04-10 NOTE — ED Notes (Signed)
Pt reporting nausea, worse in the mornings, for about 3 weeks.  Pt also reporting feeling that her heart is racing.  No tachycardia during triage. Pt states that she didn't take antidepressant or anxiety medication yet this morning because she didn't feel like eating.

## 2015-04-10 NOTE — ED Notes (Signed)
Pt made aware to return if symptoms worsen or if any life threatening symptoms occur.   

## 2015-04-10 NOTE — Discharge Instructions (Signed)
Take nexium twice a day.  Take one in the morning and one at bed time.  Follow up with dr. Darrick Pennafields

## 2015-04-10 NOTE — ED Notes (Signed)
Pt taken to XR.  

## 2015-04-14 ENCOUNTER — Telehealth: Payer: Self-pay | Admitting: Family Medicine

## 2015-04-14 MED ORDER — CLONAZEPAM 2 MG PO TABS: ORAL_TABLET | ORAL | Status: DC

## 2015-04-14 NOTE — Telephone Encounter (Signed)
Patient aware rx is sent to pharmacy.

## 2015-04-14 NOTE — Telephone Encounter (Signed)
Patient would like refill of her Klonopin, last filled on 03/18/15

## 2015-04-18 ENCOUNTER — Other Ambulatory Visit: Payer: Self-pay | Admitting: Family Medicine

## 2015-04-23 ENCOUNTER — Other Ambulatory Visit: Payer: Self-pay | Admitting: Family Medicine

## 2015-05-06 ENCOUNTER — Ambulatory Visit (INDEPENDENT_AMBULATORY_CARE_PROVIDER_SITE_OTHER): Payer: Medicare Other | Admitting: Family Medicine

## 2015-05-06 ENCOUNTER — Encounter: Payer: Self-pay | Admitting: Family Medicine

## 2015-05-06 VITALS — BP 139/80 | HR 74 | Temp 97.1°F | Ht 66.0 in | Wt 268.0 lb

## 2015-05-06 DIAGNOSIS — F411 Generalized anxiety disorder: Secondary | ICD-10-CM | POA: Diagnosis not present

## 2015-05-06 DIAGNOSIS — F32A Depression, unspecified: Secondary | ICD-10-CM

## 2015-05-06 DIAGNOSIS — F329 Major depressive disorder, single episode, unspecified: Secondary | ICD-10-CM

## 2015-05-06 NOTE — Progress Notes (Signed)
   Subjective:    Patient ID: Lauren Harmon, female    DOB: 02/14/1951, 64 y.o.   MRN: [id number]  HPI 64 year old female with chronic anxiety and depression. At her last visit she was started on Vibryd and seemed to be doing well but for some reason increased her dose to 40 mg. Now she is having some stomach problems that she describes as nausea but not really pain. She stopped vibrate on her own without tapering 4 days ago. I think some of her symptoms may be related to the increased dose and/or stopping without tapering.  She also complains of some congestion in her years she hears some fluid moving in her ears when she walks.    Review of Systems  Constitutional: Negative.   HENT: Positive for congestion.   Respiratory: Negative.   Cardiovascular: Negative.   Gastrointestinal: Positive for nausea.  Psychiatric/Behavioral: The patient is nervous/anxious.       BP 139/80 mmHg  Pulse 74  Temp(Src) 97.1 F (36.2 C) (Oral)  Ht 5\' 6"  (1.676 m)  Wt 268 lb (121.564 kg)  BMI 43.28 kg/m2  LMP 08/09/2002  Objective:   Physical Exam  Constitutional: She appears well-developed.  HENT:  Left tympanic membrane is dull without normal light reflex suggesting some fluid or effusion  Cardiovascular: Normal rate and regular rhythm.   Abdominal: Soft. There is no tenderness.          Assessment & Plan:  1. GAD (generalized anxiety disorder) Any with Klonopin as before  2. Depression To be tolerating 20 mg of Vibryd area and I suggested cutting her 40 mg in half and continue with 20. If her GI symptoms persist will refer to gastroenterology for scoping.  Frederica KusterStephen M Lauria Depoy MD

## 2015-05-06 NOTE — Patient Instructions (Signed)
Generalized Anxiety Disorder Generalized anxiety disorder (GAD) is a mental disorder. It interferes with life functions, including relationships, work, and school. GAD is different from normal anxiety, which everyone experiences at some point in their lives in response to specific life events and activities. Normal anxiety actually helps us prepare for and get through these life events and activities. Normal anxiety goes away after the event or activity is over.  GAD causes anxiety that is not necessarily related to specific events or activities. It also causes excess anxiety in proportion to specific events or activities. The anxiety associated with GAD is also difficult to control. GAD can vary from mild to severe. People with severe GAD can have intense waves of anxiety with physical symptoms (panic attacks).  SYMPTOMS The anxiety and worry associated with GAD are difficult to control. This anxiety and worry are related to many life events and activities and also occur more days than not for 6 months or longer. People with GAD also have three or more of the following symptoms (one or more in children):  Restlessness.   Fatigue.  Difficulty concentrating.   Irritability.  Muscle tension.  Difficulty sleeping or unsatisfying sleep. DIAGNOSIS GAD is diagnosed through an assessment by your health care provider. Your health care provider will ask you questions aboutyour mood,physical symptoms, and events in your life. Your health care provider may ask you about your medical history and use of alcohol or drugs, including prescription medicines. Your health care provider may also do a physical exam and blood tests. Certain medical conditions and the use of certain substances can cause symptoms similar to those associated with GAD. Your health care provider may refer you to a mental health specialist for further evaluation. TREATMENT The following therapies are usually used to treat GAD:    Medication. Antidepressant medication usually is prescribed for long-term daily control. Antianxiety medicines may be added in severe cases, especially when panic attacks occur.   Talk therapy (psychotherapy). Certain types of talk therapy can be helpful in treating GAD by providing support, education, and guidance. A form of talk therapy called cognitive behavioral therapy can teach you healthy ways to think about and react to daily life events and activities.  Stress managementtechniques. These include yoga, meditation, and exercise and can be very helpful when they are practiced regularly. A mental health specialist can help determine which treatment is best for you. Some people see improvement with one therapy. However, other people require a combination of therapies.   This information is not intended to replace advice given to you by your health care provider. Make sure you discuss any questions you have with your health care provider.   Document Released: 08/27/2012 Document Revised: 05/23/2014 Document Reviewed: 08/27/2012 Elsevier Interactive Patient Education 2016 [employer]   Take 20 mg (1/2 of the 40mg  tablet) every day. Use flonase every day for your ears "roaring".

## 2015-05-07 ENCOUNTER — Ambulatory Visit: Payer: Medicare Other | Admitting: Family Medicine

## 2015-05-13 ENCOUNTER — Other Ambulatory Visit: Payer: Self-pay | Admitting: Family Medicine

## 2015-05-14 ENCOUNTER — Telehealth: Payer: Self-pay | Admitting: Family Medicine

## 2015-05-14 MED ORDER — CLONAZEPAM 2 MG PO TABS: ORAL_TABLET | ORAL | Status: DC

## 2015-05-14 NOTE — Telephone Encounter (Signed)
Last filled 04/14/15, last seen by Hyacinth MeekerMiller 05/06/15. Call in at [facility]

## 2015-05-14 NOTE — Telephone Encounter (Signed)
Please call in klonopin with 1 refills Last refill without being seen

## 2015-05-15 DIAGNOSIS — I10 Essential (primary) hypertension: Secondary | ICD-10-CM | POA: Diagnosis not present

## 2015-05-15 DIAGNOSIS — M1712 Unilateral primary osteoarthritis, left knee: Secondary | ICD-10-CM | POA: Diagnosis not present

## 2015-05-15 DIAGNOSIS — M25562 Pain in left knee: Secondary | ICD-10-CM | POA: Diagnosis not present

## 2015-05-22 ENCOUNTER — Ambulatory Visit: Payer: Medicare Other | Admitting: Family Medicine

## 2015-06-03 ENCOUNTER — Other Ambulatory Visit: Payer: Self-pay | Admitting: Family Medicine

## 2015-06-10 ENCOUNTER — Telehealth: Payer: Self-pay | Admitting: Family Medicine

## 2015-06-10 NOTE — Telephone Encounter (Signed)
Stp and advised she is to only take half of the  tablet. Take only  of the Viibryd. Pt repeated it back to me and voiced understanding. Advised pt to CB with any further questions or concerns.

## 2015-06-19 ENCOUNTER — Ambulatory Visit: Payer: Medicare Other | Admitting: Family Medicine

## 2015-06-21 ENCOUNTER — Other Ambulatory Visit: Payer: Self-pay | Admitting: Nurse Practitioner

## 2015-06-29 ENCOUNTER — Telehealth: Payer: Self-pay | Admitting: Family Medicine

## 2015-06-29 ENCOUNTER — Emergency Department (HOSPITAL_COMMUNITY)
Admission: EM | Admit: 2015-06-29 | Discharge: 2015-06-29 | Disposition: A | Discharge status: Home / Self Care | Payer: Medicare Other | Attending: Emergency Medicine | Admitting: Emergency Medicine

## 2015-06-29 ENCOUNTER — Emergency Department (HOSPITAL_COMMUNITY): Payer: Medicare Other

## 2015-06-29 ENCOUNTER — Encounter (HOSPITAL_COMMUNITY): Payer: Self-pay | Admitting: Emergency Medicine

## 2015-06-29 DIAGNOSIS — R109 Unspecified abdominal pain: Secondary | ICD-10-CM | POA: Diagnosis present

## 2015-06-29 DIAGNOSIS — E7800 Pure hypercholesterolemia, unspecified: Secondary | ICD-10-CM | POA: Insufficient documentation

## 2015-06-29 DIAGNOSIS — Z79899 Other long term (current) drug therapy: Secondary | ICD-10-CM | POA: Insufficient documentation

## 2015-06-29 DIAGNOSIS — K219 Gastro-esophageal reflux disease without esophagitis: Secondary | ICD-10-CM | POA: Diagnosis not present

## 2015-06-29 DIAGNOSIS — K279 Peptic ulcer, site unspecified, unspecified as acute or chronic, without hemorrhage or perforation: Secondary | ICD-10-CM | POA: Insufficient documentation

## 2015-06-29 DIAGNOSIS — R112 Nausea with vomiting, unspecified: Secondary | ICD-10-CM | POA: Insufficient documentation

## 2015-06-29 DIAGNOSIS — F329 Major depressive disorder, single episode, unspecified: Secondary | ICD-10-CM | POA: Diagnosis not present

## 2015-06-29 DIAGNOSIS — J45909 Unspecified asthma, uncomplicated: Secondary | ICD-10-CM | POA: Insufficient documentation

## 2015-06-29 DIAGNOSIS — Z791 Long term (current) use of non-steroidal anti-inflammatories (NSAID): Secondary | ICD-10-CM | POA: Insufficient documentation

## 2015-06-29 DIAGNOSIS — F419 Anxiety disorder, unspecified: Secondary | ICD-10-CM | POA: Diagnosis not present

## 2015-06-29 DIAGNOSIS — R935 Abnormal findings on diagnostic imaging of other abdominal regions, including retroperitoneum: Secondary | ICD-10-CM | POA: Diagnosis not present

## 2015-06-29 DIAGNOSIS — Z7982 Long term (current) use of aspirin: Secondary | ICD-10-CM | POA: Diagnosis not present

## 2015-06-29 DIAGNOSIS — I10 Essential (primary) hypertension: Secondary | ICD-10-CM | POA: Insufficient documentation

## 2015-06-29 DIAGNOSIS — M1990 Unspecified osteoarthritis, unspecified site: Secondary | ICD-10-CM | POA: Insufficient documentation

## 2015-06-29 DIAGNOSIS — R110 Nausea: Secondary | ICD-10-CM | POA: Diagnosis not present

## 2015-06-29 DIAGNOSIS — K2970 Gastritis, unspecified, without bleeding: Secondary | ICD-10-CM | POA: Diagnosis not present

## 2015-06-29 LAB — CBC WITH DIFFERENTIAL/PLATELET
BASOS PCT: 0 %
Basophils Absolute: 0 10*3/uL (ref 0.0–0.1)
Eosinophils Absolute: 0 10*3/uL (ref 0.0–0.7)
Eosinophils Relative: 0 %
HEMATOCRIT: 41 % (ref 36.0–46.0)
HEMOGLOBIN: 13.4 g/dL (ref 12.0–15.0)
Lymphocytes Relative: 18 %
Lymphs Abs: 0.9 10*3/uL (ref 0.7–4.0)
MCH: 28.2 pg (ref 26.0–34.0)
MCHC: 32.7 g/dL (ref 30.0–36.0)
MCV: 86.3 fL (ref 78.0–100.0)
MONOS PCT: 8 %
Monocytes Absolute: 0.5 10*3/uL (ref 0.1–1.0)
NEUTROS ABS: 3.9 10*3/uL (ref 1.7–7.7)
NEUTROS PCT: 74 %
Platelets: 193 10*3/uL (ref 150–400)
RBC: 4.75 MIL/uL (ref 3.87–5.11)
RDW: 14.2 % (ref 11.5–15.5)
WBC: 5.3 10*3/uL (ref 4.0–10.5)

## 2015-06-29 LAB — URINALYSIS, ROUTINE W REFLEX MICROSCOPIC
BILIRUBIN URINE: NEGATIVE
Glucose, UA: NEGATIVE mg/dL
Hgb urine dipstick: NEGATIVE
Ketones, ur: NEGATIVE mg/dL
LEUKOCYTES UA: NEGATIVE
NITRITE: NEGATIVE
PH: 6.5 (ref 5.0–8.0)
Protein, ur: NEGATIVE mg/dL
SPECIFIC GRAVITY, URINE: 1.01 (ref 1.005–1.030)

## 2015-06-29 LAB — COMPREHENSIVE METABOLIC PANEL
ALBUMIN: 4.1 g/dL (ref 3.5–5.0)
ALT: 19 U/L (ref 14–54)
ANION GAP: 8 (ref 5–15)
AST: 19 U/L (ref 15–41)
Alkaline Phosphatase: 47 U/L (ref 38–126)
BILIRUBIN TOTAL: 0.6 mg/dL (ref 0.3–1.2)
BUN: 7 mg/dL (ref 6–20)
CO2: 28 mmol/L (ref 22–32)
Calcium: 9.2 mg/dL (ref 8.9–10.3)
Chloride: 102 mmol/L (ref 101–111)
Creatinine, Ser: 0.95 mg/dL (ref 0.44–1.00)
GFR calc Af Amer: >60 mL/min (ref >60)
GFR calc non Af Amer: >60 mL/min (ref >60)
GLUCOSE: 105 mg/dL — AB (ref 65–99)
POTASSIUM: 3.7 mmol/L (ref 3.5–5.1)
SODIUM: 138 mmol/L (ref 135–145)
TOTAL PROTEIN: 7 g/dL (ref 6.5–8.1)

## 2015-06-29 LAB — LIPASE, BLOOD: Lipase: 22 U/L (ref 11–51)

## 2015-06-29 MED ORDER — ONDANSETRON HCL 4 MG/2ML IJ SOLN
4.0000 mg | Freq: Once | INTRAMUSCULAR | Status: AC
  Administered 2015-06-29: 4 mg via INTRAVENOUS
  Filled 2015-06-29: qty 2

## 2015-06-29 MED ORDER — LORAZEPAM 1 MG PO TABS
1.0000 mg | ORAL_TABLET | Freq: Once | ORAL | Status: AC
  Administered 2015-06-29: 1 mg via ORAL
  Filled 2015-06-29: qty 1

## 2015-06-29 MED ORDER — GI COCKTAIL ~~LOC~~
30.0000 mL | Freq: Once | ORAL | Status: AC
  Administered 2015-06-29: 30 mL via ORAL
  Filled 2015-06-29: qty 30

## 2015-06-29 NOTE — ED Provider Notes (Signed)
CSN: [id number]     Arrival date & time 06/29/15  2130 History   First MD Initiated Contact with Patient 06/29/15 [phone]     Chief Complaint  Patient presents with  . Abdominal Pain     (Consider location/radiation/quality/duration/timing/severity/associated sxs/prior Treatment) HPI   Lauren Harmon is a 65 y.o. female who presents to the Emergency Department complaining of nausea and intermittent vomiting associated with taking her anxiety medications.  Symptoms have been present for 5 months.  Patient also states that she is having anxiety and that her current medications are not helping.  She states that she has not discussed this with her PMD despite seeing him recently.  She denies chest or abdominal pain, fever, chills, diarrhea or bloody vomitus.  She also denies SI or HI thoughts or plans   Past Medical History  Diagnosis Date  . Anxiety   . H/O: knee surgery   . Hypertension   . Allergy   . Depression   . Asthma   . Osteoarthritis   . Palpitations   . Osteopenia   . Hemorrhoid   . Panic attack   . Hypercholesteremia   . GERD (gastroesophageal reflux disease)    Past Surgical History  Procedure Laterality Date  . Eye surgery    . Knee surgery    . Esophagogastroduodenoscopy N/A 07/02/2014    Procedure: ESOPHAGOGASTRODUODENOSCOPY (EGD);  Surgeon: Iva Boop, MD;  Location: [facility];  Service: Endoscopy;  Laterality: N/A;  with possible dilation or botox  . Balloon dilation N/A 07/02/2014    Procedure: BALLOON DILATION;  Surgeon: Iva Boop, MD;  Location: [facility];  Service: Endoscopy;  Laterality: N/A;  . Botox injection N/A 07/02/2014    Procedure: BOTOX INJECTION;  Surgeon: Iva Boop, MD;  Location: [facility];  Service: Endoscopy;  Laterality: N/A;   Family History  Problem Relation Age of Onset  . Osteoarthritis Mother   . Hypertension Mother   . Hypertension Father   . Hyperlipidemia Father   . Heart disease Father   . Osteoarthritis  Father   . Colon cancer Father   . Lung cancer Maternal Uncle     smoker   Social History  Substance Use Topics  . Smoking status: Never Smoker   . Smokeless tobacco: Never Used  . Alcohol Use: No   OB History    No data available     Review of Systems  Constitutional: Negative for fever, chills and appetite change.  HENT: Negative for trouble swallowing.   Respiratory: Negative for shortness of breath.   Cardiovascular: Negative for chest pain.  Gastrointestinal: Positive for nausea and vomiting. Negative for abdominal pain and blood in stool.  Genitourinary: Negative for dysuria, flank pain, decreased urine volume and difficulty urinating.  Musculoskeletal: Negative for back pain.  Skin: Negative for color change and rash.  Neurological: Negative for dizziness, weakness and numbness.  Hematological: Negative for adenopathy.  Psychiatric/Behavioral: Negative for suicidal ideas and confusion. The patient is nervous/anxious.   All other systems reviewed and are negative.     Allergies  Wellbutrin  Home Medications   Prior to Admission medications   Medication Sig Start Date End Date Taking? Authorizing Provider  amLODipine-benazepril (LOTREL) 5-20 MG capsule TAKE 1 CAPSULE BY MOUTH DAILY. 04/20/15  Yes Frederica Kuster, MD  aspirin EC 81 MG tablet Take 81 mg by mouth every morning.    Yes Historical Provider, MD  atorvastatin (LIPITOR) 40 MG tablet TAKE 1 TABLET (40 MG  TOTAL) BY MOUTH DAILY. 04/23/15  Yes Frederica Kuster, MD  Cholecalciferol (VITAMIN D) 2000 UNITS CAPS Take 1 capsule by mouth daily.   Yes Historical Provider, MD  clonazePAM (KLONOPIN) 2 MG tablet Take 1 tablet at 6 am, 1 tablet at 2 pm then 1 tablet at 9 pm 05/14/15  Yes Mary-Margaret [name], FNP  docusate sodium (COLACE) 100 MG capsule Take 100 mg by mouth daily as needed for mild constipation.    Yes Historical Provider, MD  esomeprazole (NEXIUM) 40 MG capsule Take 1 capsule (40 mg total) by mouth daily.  03/23/15  Yes Frederica Kuster, MD  furosemide (LASIX) 20 MG tablet Take 1 tablet (20 mg total) by mouth every other day. 07/02/14  Yes Iva Boop, MD  HYDROcodone-acetaminophen (NORCO) 7.5-325 MG per tablet Take 1 tablet by mouth every 4 (four) hours as needed for moderate pain.    Yes Historical Provider, MD  meloxicam (MOBIC) 15 MG tablet TAKE 1 TABLET (15 MG TOTAL) BY MOUTH DAILY. 02/04/15  Yes Frederica Kuster, MD  montelukast (SINGULAIR) 10 MG tablet TAKE 1 TABLET (10 MG TOTAL) BY MOUTH AT BEDTIME. 04/20/15  Yes Frederica Kuster, MD  tiZANidine (ZANAFLEX) 4 MG tablet Take 4 mg by mouth at bedtime.   Yes Historical Provider, MD  VIIBRYD 40 MG TABS TAKE 1 TABLET (40 MG TOTAL) BY MOUTH DAILY. Patient taking differently: TAKE 1/2 TABLET TWICE DAILY. 06/03/15  Yes Frederica Kuster, MD   BP 114/91 mmHg  Pulse 78  Temp(Src) 97.8 F (36.6 C) (Axillary)  Resp 18  SpO2 99%  LMP 08/09/2002 Physical Exam  Constitutional: She is oriented to person, place, and time. She appears well-developed and well-nourished. No distress.  HENT:  Head: Normocephalic and atraumatic.  Mouth/Throat: Oropharynx is clear and moist.  Neck: Neck supple.  Cardiovascular: Normal rate, regular rhythm, normal heart sounds and intact distal pulses.   No murmur heard. Pulmonary/Chest: Effort normal and breath sounds normal. No respiratory distress.  Abdominal: Soft. Bowel sounds are normal. She exhibits no distension and no mass. There is no tenderness. There is no rebound and no guarding.  Musculoskeletal: Normal range of motion. She exhibits no edema.  Lymphadenopathy:    She has no cervical adenopathy.  Neurological: She is alert and oriented to person, place, and time. She exhibits normal muscle tone. Coordination normal.  Skin: Skin is warm and dry.  Psychiatric: She has a normal mood and affect.  Nursing note and vitals reviewed.   ED Course  Procedures (including critical care time) Labs Review Labs  Reviewed  COMPREHENSIVE METABOLIC PANEL - Abnormal; Notable for the following:    Glucose, Bld 105 (*)    All other components within normal limits  LIPASE, BLOOD  CBC WITH DIFFERENTIAL/PLATELET  URINALYSIS, ROUTINE W REFLEX MICROSCOPIC (NOT AT [facility])    Imaging Review US Abdomen Complete  06/29/2015  CLINICAL DATA:  Chronic nausea, history of gastroesophageal reflux, hyperlipidemia. EXAM: ABDOMEN ULTRASOUND COMPLETE COMPARISON:  None in PACs FINDINGS: Gallbladder: The gallbladder is adequately distended with no evidence of stones, wall thickening, or pericholecystic fluid. Common bile duct: Diameter: 7 mm. No intraluminal echoes are observed. Liver: The hepatic echotexture is mildly increased diffusely. There is no focal mass or ductal dilation. IVC: No abnormality visualized. Pancreas: Evaluation of the pancreas is limited due to bowel gas. Only a small portion of the pancreatic body could be demonstrated. Spleen: Size and appearance within normal limits. Right Kidney: Length: 11.1 cm. Echogenicity within normal limits.  No mass or hydronephrosis visualized. Left Kidney: Length: 10.6 cm. Echogenicity within normal limits. No mass or hydronephrosis visualized. Abdominal aorta: Bowel gas obscures the proximal aorta. The mid aorta measures 2 cm in greatest diameter. The distal aorta measures 1.7 cm in greatest dimension. The common iliac arteries measure 1.1 cm on the right and 1.3 cm on the left. Other findings: None. IMPRESSION: 1. Fatty infiltrative change of the liver. No acute gallbladder pathology observed. If there are clinical concerns of gallbladder dysfunction, a nuclear medicine hepatobiliary scan may be useful. 2. Limited visualization of the pancreas and abdominal aorta. No acute abnormality of either kidney nor of the spleen. Electronically Signed   By: David  [place] M.D.   On: 06/29/2015 12:16    I have personally reviewed and evaluated these images and lab results as part of my medical  decision-making.   EKG Interpretation   Date/Time:  Monday June 29 2015 09:38:50 EST Ventricular Rate:  67 PR Interval:  145 QRS Duration: 104 QT Interval:  411 QTC Calculation: 434 R Axis:   -9 Text Interpretation:  Sinus rhythm Baseline wander in lead(s) II aVR aVF  since last tracing no significant change Confirmed by Effie Shy  MD, ELLIOTT  ([id number]) on 06/29/2015 9:46:53 AM      MDM   Final diagnoses:  PUD (peptic ulcer disease)  Anxiety    1100  Consulted Dr. Christell Constant at [facility], recommended to order US of the abdomen and give referral to GI.  Will see pt in office for recheck this week regarding her current medications  Labs and [place] results are reassuring.  Sx's felt to be related to PUD.  She is calm and no concerning sx's for acute abdomen.  Appears stable for d/c and advised to continue current medications.   Pauline Aus, PA-C 07/01/15 2319  Mancel Bale, MD 07/02/15 [phone]

## 2015-06-29 NOTE — Discharge Instructions (Signed)
Food Choices for Peptic Ulcer Disease °When you have peptic ulcer disease, the foods you eat and your eating habits are very important. Choosing the right foods can help ease the discomfort of peptic ulcer disease. °WHAT GENERAL GUIDELINES DO I NEED TO FOLLOW? °· Choose fruits, vegetables, whole grains, and low-fat meat, fish, and poultry.   °· Keep a food diary to identify foods that cause symptoms. °· Avoid foods that cause irritation or pain. These may be different for different people. °· Eat frequent small meals instead of three large meals each day. The pain may be worse when your stomach is empty.  °· Avoid eating close to bedtime. °WHAT FOODS ARE NOT RECOMMENDED? °The following are some foods and drinks that may worsen your symptoms: °· Black, white, and red pepper. °· Hot sauce. °· Chili peppers. °· Chili powder. °· Chocolate and cocoa.    °· Alcohol. °· Tea, coffee, and cola (regular and decaffeinated). °The items listed above may not be a complete list of foods and beverages to avoid. Contact your dietitian for more information. °  °This information is not intended to replace advice given to you by your health care provider. Make sure you discuss any questions you have with your health care provider. °  °Document Released: 07/25/2011 Document Revised: 05/07/2013 Document Reviewed: 03/06/2013 °Elsevier Interactive Patient Education ©2016 Elsevier Inc. ° °Peptic Ulcer °A peptic ulcer is a painful sore in the lining of your esophagus, stomach, or in the first part of your small intestine. The main causes of an ulcer can be: °· An infection. °· Using certain pain medicines too often or too much. °· Smoking. °HOME CARE °· Avoid smoking, alcohol, and caffeine. °· Avoid foods that bother you. °· Only take medicine as told by your doctor. Do not take any medicines your doctor has not approved. °· Keep all doctor visits as told. °GET HELP IF: °· You do not get better in 7 days after starting treatment. °· You keep  having an upset stomach (indigestion) or heartburn. °GET HELP RIGHT AWAY IF: °· You have sudden, sharp, or lasting belly (abdominal) pain. °· You have bloody, black, or tarry poop (stool). °· You throw up (vomit) blood or your throw up looks like coffee grounds. °· You get light-headed, weak, or feel like you will pass out (faint). °· You get sweaty or feel sticky and cold to the touch (clammy). °MAKE SURE YOU:  °· Understand these instructions. °· Will watch your condition. °· Will get help right away if you are not doing well or get worse. °  °This information is not intended to replace advice given to you by your health care provider. Make sure you discuss any questions you have with your health care provider. °  °Document Released: 07/27/2009 Document Revised: 05/23/2014 Document Reviewed: 11/30/2011 °Elsevier Interactive Patient Education ©2016 Elsevier Inc. ° °

## 2015-06-29 NOTE — ED Notes (Signed)
Pt comes in via EMS x5 months. Pt was given nexium by PCP and states it doesn't work. Pt has emesis in the morning when she tries to take her daily meds.

## 2015-06-29 NOTE — Telephone Encounter (Signed)
Patient given an appointment for 9:00 on Thursday with Hyacinth Meeker. ER wanted her to have a follow up.

## 2015-06-29 NOTE — ED Notes (Signed)
Tammy, PA at bedside.  

## 2015-06-29 NOTE — ED Notes (Signed)
Pt made aware to return if symptoms worsen or if any life threatening symptoms occur.   

## 2015-07-02 ENCOUNTER — Encounter: Payer: Self-pay | Admitting: Family Medicine

## 2015-07-02 ENCOUNTER — Ambulatory Visit (INDEPENDENT_AMBULATORY_CARE_PROVIDER_SITE_OTHER): Payer: Medicare Other | Admitting: Family Medicine

## 2015-07-02 VITALS — BP 140/80 | HR 77 | Temp 98.7°F | Ht 66.0 in | Wt 258.6 lb

## 2015-07-02 DIAGNOSIS — R1013 Epigastric pain: Secondary | ICD-10-CM

## 2015-07-02 DIAGNOSIS — G8929 Other chronic pain: Secondary | ICD-10-CM | POA: Diagnosis not present

## 2015-07-02 NOTE — Progress Notes (Signed)
Subjective:    Patient ID: Lauren Harmon, female    DOB: 29-Apr-1951, 65 y.o.   MRN: [id number]  HPI 65 year old female here after being seen in the emergency room for abdominal pain nausea and vomiting. In the ER she had ultrasound which failed to demonstrate any pathology except for fatty liver. [name] is very very difficult to deal with. It seems that nothing ever helps her works and we have tried multiple medicines for her nerves which I think are her main problem. Now she has decided that her anxiety medicine, Klonopin. is the cause of her sym[yoms,even though she is taking Viibryd, which is more likely a cause of her symptoms. She keeps repeating that she needs to be in the hospital to run tests. I tried to explain that any tests that need to be done can be done as an outpatient. It seems that anything I say is not heard. She does have a follow-up appointment with the GI doctor in March but she does not want to wait until then.  Patient Active Problem List   Diagnosis Date Noted  . Dysphagia   . Esophageal stenosis   . Anxiety   . GAD (generalized anxiety disorder) 02/11/2013  . Hypertension 09/21/2010  . Hyperlipidemia 09/21/2010  . Osteoarthritis 09/21/2010  . Depression 09/21/2010  . Asthma 09/21/2010  . Obesity 09/21/2010  . Osteopenia 09/21/2010  . Hemorrhoids 09/21/2010  . Lumbar disc disease 09/21/2010  . Esophageal dysmotility 09/21/2010   Outpatient Encounter Prescriptions as of 07/02/2015  Medication Sig  . amLODipine-benazepril (LOTREL) 5-20 MG capsule TAKE 1 CAPSULE BY MOUTH DAILY.  Marland Kitchen aspirin EC 81 MG tablet Take 81 mg by mouth every morning.   Marland Kitchen atorvastatin (LIPITOR) 40 MG tablet TAKE 1 TABLET (40 MG TOTAL) BY MOUTH DAILY.  Marland Kitchen Cholecalciferol (VITAMIN D) 2000 UNITS CAPS Take 1 capsule by mouth daily.  . clonazePAM (KLONOPIN) 2 MG tablet Take 1 tablet at 6 am, 1 tablet at 2 pm then 1 tablet at 9 pm  . docusate sodium (COLACE) 100 MG capsule Take 100 mg by mouth  daily as needed for mild constipation.   Marland Kitchen esomeprazole (NEXIUM) 40 MG capsule Take 1 capsule (40 mg total) by mouth daily.  . furosemide (LASIX) 20 MG tablet Take 1 tablet (20 mg total) by mouth every other day.  Marland Kitchen HYDROcodone-acetaminophen (NORCO) 7.5-325 MG per tablet Take 1 tablet by mouth every 4 (four) hours as needed for moderate pain.   . meloxicam (MOBIC) 15 MG tablet TAKE 1 TABLET (15 MG TOTAL) BY MOUTH DAILY.  . montelukast (SINGULAIR) 10 MG tablet TAKE 1 TABLET (10 MG TOTAL) BY MOUTH AT BEDTIME.  Marland Kitchen tiZANidine (ZANAFLEX) 4 MG tablet Take 4 mg by mouth at bedtime.  Marland Kitchen VIIBRYD 40 MG TABS TAKE 1 TABLET (40 MG TOTAL) BY MOUTH DAILY. (Patient taking differently: TAKE 1/2 TABLET TWICE DAILY.)   No facility-administered encounter medications on file as of 07/02/2015.      Review of Systems  Constitutional: Negative.   HENT: Negative.   Cardiovascular: Negative.   Gastrointestinal: Positive for nausea and abdominal pain.  Psychiatric/Behavioral: The patient is nervous/anxious.        Objective:   Physical Exam  Constitutional: She appears well-developed.  Cardiovascular: Normal rate and regular rhythm.   Pulmonary/Chest: Effort normal.  Abdominal: Soft. There is no tenderness. There is no rebound.          Assessment & Plan:  1. Abdominal pain, chronic, epigastric Discontinue Viibryd. I recommended  to continue Klonopin because she definitely needs at but I'm not sure she will take it. Ordered a HIDA scan to assess biliary function. I think her problems are more related to a functional irritable bowel syndrome related to her anxieties and depression which are resistant to treatment. Consider referral to a different gastroenterologist who can see her sooner depending on results of HIDA study  Frederica Kuster MD - [place] Hepato W/EjeCT Fract; Future

## 2015-07-03 ENCOUNTER — Encounter (HOSPITAL_COMMUNITY): Payer: Medicare Other

## 2015-07-04 ENCOUNTER — Telehealth: Payer: Self-pay | Admitting: Family Medicine

## 2015-07-04 DIAGNOSIS — K219 Gastro-esophageal reflux disease without esophagitis: Secondary | ICD-10-CM | POA: Diagnosis not present

## 2015-07-04 DIAGNOSIS — Z79899 Other long term (current) drug therapy: Secondary | ICD-10-CM | POA: Diagnosis not present

## 2015-07-04 DIAGNOSIS — Z7982 Long term (current) use of aspirin: Secondary | ICD-10-CM | POA: Diagnosis not present

## 2015-07-04 DIAGNOSIS — R1013 Epigastric pain: Secondary | ICD-10-CM | POA: Diagnosis not present

## 2015-07-04 DIAGNOSIS — R1084 Generalized abdominal pain: Secondary | ICD-10-CM | POA: Diagnosis not present

## 2015-07-04 DIAGNOSIS — M1990 Unspecified osteoarthritis, unspecified site: Secondary | ICD-10-CM | POA: Diagnosis not present

## 2015-07-04 DIAGNOSIS — K449 Diaphragmatic hernia without obstruction or gangrene: Secondary | ICD-10-CM | POA: Diagnosis not present

## 2015-07-04 DIAGNOSIS — I10 Essential (primary) hypertension: Secondary | ICD-10-CM | POA: Diagnosis not present

## 2015-07-04 DIAGNOSIS — R109 Unspecified abdominal pain: Secondary | ICD-10-CM | POA: Diagnosis not present

## 2015-07-04 DIAGNOSIS — E7800 Pure hypercholesterolemia, unspecified: Secondary | ICD-10-CM | POA: Diagnosis not present

## 2015-07-04 NOTE — Telephone Encounter (Signed)
Received call from ER physician at [facility].  He was calling to review recent imaging findings. He will treat her with pain medications and antibiotics and have her follow-up with the HIDA scan as planned.  Murtis Sink, MD [facility] 07/04/2015, 12:10 PM

## 2015-07-05 ENCOUNTER — Encounter (HOSPITAL_COMMUNITY): Payer: Self-pay | Admitting: *Deleted

## 2015-07-05 ENCOUNTER — Emergency Department (HOSPITAL_COMMUNITY)
Admission: EM | Admit: 2015-07-05 | Discharge: 2015-07-05 | Disposition: A | Discharge status: Other Acute Inpatient Hospital | Payer: Medicare Other | Attending: Emergency Medicine | Admitting: Emergency Medicine

## 2015-07-05 DIAGNOSIS — F1310 Sedative, hypnotic or anxiolytic abuse, uncomplicated: Secondary | ICD-10-CM | POA: Diagnosis not present

## 2015-07-05 DIAGNOSIS — M1990 Unspecified osteoarthritis, unspecified site: Secondary | ICD-10-CM | POA: Diagnosis not present

## 2015-07-05 DIAGNOSIS — Z7982 Long term (current) use of aspirin: Secondary | ICD-10-CM | POA: Insufficient documentation

## 2015-07-05 DIAGNOSIS — F1110 Opioid abuse, uncomplicated: Secondary | ICD-10-CM | POA: Insufficient documentation

## 2015-07-05 DIAGNOSIS — Z791 Long term (current) use of non-steroidal anti-inflammatories (NSAID): Secondary | ICD-10-CM | POA: Insufficient documentation

## 2015-07-05 DIAGNOSIS — Z79899 Other long term (current) drug therapy: Secondary | ICD-10-CM | POA: Diagnosis not present

## 2015-07-05 DIAGNOSIS — K219 Gastro-esophageal reflux disease without esophagitis: Secondary | ICD-10-CM | POA: Insufficient documentation

## 2015-07-05 DIAGNOSIS — I10 Essential (primary) hypertension: Secondary | ICD-10-CM | POA: Diagnosis not present

## 2015-07-05 DIAGNOSIS — F410 Panic disorder [episodic paroxysmal anxiety] without agoraphobia: Secondary | ICD-10-CM | POA: Diagnosis not present

## 2015-07-05 DIAGNOSIS — F329 Major depressive disorder, single episode, unspecified: Secondary | ICD-10-CM | POA: Insufficient documentation

## 2015-07-05 DIAGNOSIS — J45909 Unspecified asthma, uncomplicated: Secondary | ICD-10-CM | POA: Insufficient documentation

## 2015-07-05 DIAGNOSIS — E7800 Pure hypercholesterolemia, unspecified: Secondary | ICD-10-CM | POA: Diagnosis not present

## 2015-07-05 DIAGNOSIS — F32A Depression, unspecified: Secondary | ICD-10-CM

## 2015-07-05 DIAGNOSIS — Z008 Encounter for other general examination: Secondary | ICD-10-CM | POA: Diagnosis present

## 2015-07-05 LAB — RAPID URINE DRUG SCREEN, HOSP PERFORMED
AMPHETAMINES: NOT DETECTED
BARBITURATES: NOT DETECTED
Benzodiazepines: POSITIVE — AB
Cocaine: NOT DETECTED
Opiates: POSITIVE — AB
TETRAHYDROCANNABINOL: NOT DETECTED

## 2015-07-05 LAB — CBC WITH DIFFERENTIAL/PLATELET
Basophils Absolute: 0 10*3/uL (ref 0.0–0.1)
Basophils Relative: 0 %
EOS PCT: 0 %
Eosinophils Absolute: 0 10*3/uL (ref 0.0–0.7)
HEMATOCRIT: 38.4 % (ref 36.0–46.0)
Hemoglobin: 12.9 g/dL (ref 12.0–15.0)
LYMPHS ABS: 1.3 10*3/uL (ref 0.7–4.0)
LYMPHS PCT: 24 %
MCH: 29.1 pg (ref 26.0–34.0)
MCHC: 33.6 g/dL (ref 30.0–36.0)
MCV: 86.5 fL (ref 78.0–100.0)
Monocytes Absolute: 0.7 10*3/uL (ref 0.1–1.0)
Monocytes Relative: 13 %
NEUTROS ABS: 3.5 10*3/uL (ref 1.7–7.7)
Neutrophils Relative %: 63 %
PLATELETS: 188 10*3/uL (ref 150–400)
RBC: 4.44 MIL/uL (ref 3.87–5.11)
RDW: 14.5 % (ref 11.5–15.5)
WBC: 5.6 10*3/uL (ref 4.0–10.5)

## 2015-07-05 LAB — ETHANOL: Alcohol, Ethyl (B): <5 mg/dL (ref <5)

## 2015-07-05 LAB — COMPREHENSIVE METABOLIC PANEL
ALT: 17 U/L (ref 14–54)
AST: 16 U/L (ref 15–41)
Albumin: 3.7 g/dL (ref 3.5–5.0)
Alkaline Phosphatase: 46 U/L (ref 38–126)
Anion gap: 7 (ref 5–15)
BUN: 8 mg/dL (ref 6–20)
CHLORIDE: 103 mmol/L (ref 101–111)
CO2: 27 mmol/L (ref 22–32)
CREATININE: 0.97 mg/dL (ref 0.44–1.00)
Calcium: 8.8 mg/dL — ABNORMAL LOW (ref 8.9–10.3)
GFR calc non Af Amer: >60 mL/min (ref >60)
Glucose, Bld: 103 mg/dL — ABNORMAL HIGH (ref 65–99)
Potassium: 4 mmol/L (ref 3.5–5.1)
SODIUM: 137 mmol/L (ref 135–145)
Total Bilirubin: 0.6 mg/dL (ref 0.3–1.2)
Total Protein: 6.2 g/dL — ABNORMAL LOW (ref 6.5–8.1)

## 2015-07-05 MED ORDER — NICOTINE 21 MG/24HR TD PT24
21.0000 mg | MEDICATED_PATCH | Freq: Every day | TRANSDERMAL | Status: DC
Start: 2015-07-05 — End: 2015-07-05
  Administered 2015-07-05: 21 mg via TRANSDERMAL
  Filled 2015-07-05: qty 1

## 2015-07-05 MED ORDER — ZOLPIDEM TARTRATE 5 MG PO TABS: 5.0000 mg | ORAL_TABLET | Freq: Every evening | ORAL | Status: DC | PRN

## 2015-07-05 MED ORDER — LORAZEPAM 0.5 MG PO TABS
0.5000 mg | ORAL_TABLET | Freq: Once | ORAL | Status: AC
  Administered 2015-07-05: 0.5 mg via ORAL
  Filled 2015-07-05: qty 1

## 2015-07-05 MED ORDER — ACETAMINOPHEN 325 MG PO TABS: 650.0000 mg | ORAL_TABLET | ORAL | Status: DC | PRN

## 2015-07-05 MED ORDER — IBUPROFEN 400 MG PO TABS: 600.0000 mg | ORAL_TABLET | Freq: Three times a day (TID) | ORAL | Status: DC | PRN

## 2015-07-05 MED ORDER — ONDANSETRON HCL 4 MG PO TABS: 4.0000 mg | ORAL_TABLET | Freq: Three times a day (TID) | ORAL | Status: DC | PRN

## 2015-07-05 NOTE — ED Notes (Signed)
Baxter Hire from [facility] states that pt has been accepted to [facility] but does not have a bed or accepting doctor yet.

## 2015-07-05 NOTE — BHH Counselor (Signed)
Pt has been faxed out to the following gero facilities:  [facility] [address]. [facility]  Kateri Plummer, M.S., LPCA, [place], [facility] Licensed Professional Counselor Associate  Triage Specialist  [facility]  Therapeutic Triage Services Phone: [phone] Fax: [phone]

## 2015-07-05 NOTE — ED Notes (Signed)
Pt signed voluntary consent form for [facility] and was faxed to facility.

## 2015-07-05 NOTE — ED Notes (Signed)
Pt belongings placed in locker room °

## 2015-07-05 NOTE — ED Notes (Signed)
Lab at bedside

## 2015-07-05 NOTE — ED Notes (Signed)
Pt has daily medications with her at ED.    Included in this count is one narcotic.  7.5/325 Vicodin 42 whole tablets and 1/2 tablet  These medications were locked in ED locker

## 2015-07-05 NOTE — ED Notes (Signed)
Pt ambulated to bathroom and changed into paper scrubs. Hat was placed in toilet but did not catch urine.
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
## 2015-07-05 NOTE — ED Notes (Signed)
Pt undergoing TTS with sitter in family room.

## 2015-07-05 NOTE — BHH Counselor (Signed)
Pt accepted to [facility] per Marni Griffon has received EKG and Voluntary consent and can be transported tonight. Dr. Lowanda Foster is accepting physician and report can be called at [phone], pt is direct admit.

## 2015-07-05 NOTE — ED Notes (Signed)
MD at bedside. 

## 2015-07-05 NOTE — BHH Counselor (Signed)
Per Delorise Shiner at [facility]- pt is accepted pending receipt of EKG. Notified Dr. Hyacinth Meeker who has ordered EKG to be done. Will fax to [facility] when this is complete.   [employer]  fax- [phone] Phone- [phone]  Kateri Plummer, M.S., LPCA, Bridget Hartshorn, [facility] Licensed Professional Counselor Associate  Triage Specialist  [facility]  Therapeutic Triage Services Phone: [phone] Fax: [phone]

## 2015-07-05 NOTE — ED Provider Notes (Signed)
CSN: [id number]     Arrival date & time 07/05/15  1305 History   First MD Initiated Contact with Patient 07/05/15 1307     Chief Complaint  Patient presents with  . Depression / Anxiety     (Consider location/radiation/quality/duration/timing/severity/associated sxs/prior Treatment) HPI Comments: The patient is a 65 year old female, she has a history of anxiety and hypertension as well as depression and panic attacks. She has been seen approximately 3 times this week for abdominal pain and anxiety including at her family doctor's office, the emergency department here as well as the emergency department at [facility]. She denies that she spoke about her anxiety at any of these visits but while she was eating breakfast this morning she became very anxious and had a panic attack, she became very shaky, she was perseverating on thoughts of suicide related to the loss of her father as well as the loss of a friend that died several years ago. Neither of these events were recent, she denies that she has been asking for help at all though review of her medical paperwork shows that she has been given some antianxiety medications in the last week. She does report that she declined any assistance for mental health during some of these visits. Even when she was offered. The anxiety seems to be getting worse, persistent, she states it is severe at this time. She denies any specific plans for suicide but does endorse having some chronic suicidal thoughts  The history is provided by the patient.    Past Medical History  Diagnosis Date  . Anxiety   . H/O: knee surgery   . Hypertension   . Allergy   . Depression   . Asthma   . Osteoarthritis   . Palpitations   . Osteopenia   . Hemorrhoid   . Panic attack   . Hypercholesteremia   . GERD (gastroesophageal reflux disease)    Past Surgical History  Procedure Laterality Date  . Eye surgery    . Knee surgery    . Esophagogastroduodenoscopy N/A  07/02/2014    Procedure: ESOPHAGOGASTRODUODENOSCOPY (EGD);  Surgeon: Iva Boop, MD;  Location: [facility];  Service: Endoscopy;  Laterality: N/A;  with possible dilation or botox  . Balloon dilation N/A 07/02/2014    Procedure: BALLOON DILATION;  Surgeon: Iva Boop, MD;  Location: [facility];  Service: Endoscopy;  Laterality: N/A;  . Botox injection N/A 07/02/2014    Procedure: BOTOX INJECTION;  Surgeon: Iva Boop, MD;  Location: [facility];  Service: Endoscopy;  Laterality: N/A;   Family History  Problem Relation Age of Onset  . Osteoarthritis Mother   . Hypertension Mother   . Hypertension Father   . Hyperlipidemia Father   . Heart disease Father   . Osteoarthritis Father   . Colon cancer Father   . Lung cancer Maternal Uncle     smoker   Social History  Substance Use Topics  . Smoking status: Never Smoker   . Smokeless tobacco: Never Used  . Alcohol Use: No   OB History    No data available     Review of Systems  All other systems reviewed and are negative.     Allergies  Wellbutrin  Home Medications   Prior to Admission medications   Medication Sig Start Date End Date Taking? Authorizing Provider  amLODipine-benazepril (LOTREL) 5-20 MG capsule TAKE 1 CAPSULE BY MOUTH DAILY. 04/20/15   Frederica Kuster, MD  aspirin EC 81 MG tablet Take  81 mg by mouth every morning.     Historical Provider, MD  atorvastatin (LIPITOR) 40 MG tablet TAKE 1 TABLET (40 MG TOTAL) BY MOUTH DAILY. 04/23/15   Frederica Kuster, MD  Cholecalciferol (VITAMIN D) 2000 UNITS CAPS Take 1 capsule by mouth daily.    Historical Provider, MD  clonazePAM (KLONOPIN) 2 MG tablet Take 1 tablet at 6 am, 1 tablet at 2 pm then 1 tablet at 9 pm 05/14/15   Mary-Margaret [name], FNP  docusate sodium (COLACE) 100 MG capsule Take 100 mg by mouth daily as needed for mild constipation.     Historical Provider, MD  esomeprazole (NEXIUM) 40 MG capsule Take 1 capsule (40 mg total) by mouth daily. 03/23/15    Frederica Kuster, MD  furosemide (LASIX) 20 MG tablet Take 1 tablet (20 mg total) by mouth every other day. 07/02/14   Iva Boop, MD  HYDROcodone-acetaminophen (NORCO) 7.5-325 MG per tablet Take 1 tablet by mouth every 4 (four) hours as needed for moderate pain.     Historical Provider, MD  meloxicam (MOBIC) 15 MG tablet TAKE 1 TABLET (15 MG TOTAL) BY MOUTH DAILY. 02/04/15   Frederica Kuster, MD  montelukast (SINGULAIR) 10 MG tablet TAKE 1 TABLET (10 MG TOTAL) BY MOUTH AT BEDTIME. 04/20/15   Frederica Kuster, MD  tiZANidine (ZANAFLEX) 4 MG tablet Take 4 mg by mouth at bedtime.    Historical Provider, MD  VIIBRYD 40 MG TABS TAKE 1 TABLET (40 MG TOTAL) BY MOUTH DAILY. Patient taking differently: TAKE 1/2 TABLET TWICE DAILY. 06/03/15   Frederica Kuster, MD   BP 126/53 mmHg  Pulse 78  Temp(Src) 98.6 F (37 C) (Oral)  Resp 16  Ht  (1.676 m)  Wt 280 lb (127.007 kg)  BMI 45.21 kg/m2  SpO2 95%  LMP 08/09/2002 Physical Exam  Constitutional: She appears well-developed and well-nourished. No distress.  HENT:  Head: Normocephalic and atraumatic.  Mouth/Throat: Oropharynx is clear and moist. No oropharyngeal exudate.  Eyes: Conjunctivae and EOM are normal. Pupils are equal, round, and reactive to light. Right eye exhibits no discharge. Left eye exhibits no discharge. No scleral icterus.  Neck: Normal range of motion. Neck supple. No JVD present. No thyromegaly present.  Cardiovascular: Normal rate, regular rhythm, normal heart sounds and intact distal pulses.  Exam reveals no gallop and no friction rub.   No murmur heard. Pulmonary/Chest: Effort normal and breath sounds normal. No respiratory distress. She has no wheezes. She has no rales.  Abdominal: Soft. Bowel sounds are normal. She exhibits no distension and no mass. There is no tenderness.  Musculoskeletal: Normal range of motion. She exhibits no edema or tenderness.  Lymphadenopathy:    She has no cervical adenopathy.  Neurological:  She is alert. Coordination normal.  Skin: Skin is warm and dry. No rash noted. No erythema.  Psychiatric:  Tearful anxious and depressed, no hallucinations  Nursing note and vitals reviewed.   ED Course  Procedures (including critical care time) Labs Review Labs Reviewed  COMPREHENSIVE METABOLIC PANEL - Abnormal; Notable for the following:    Glucose, Bld 103 (*)    Calcium 8.8 (*)    Total Protein 6.2 (*)    All other components within normal limits  ETHANOL  CBC WITH DIFFERENTIAL/PLATELET  URINE RAPID DRUG SCREEN, HOSP PERFORMED    Imaging Review No results found. I have personally reviewed and evaluated these images and lab results as part of my medical decision-making.  MDM   Final diagnoses:  None    Anxiety and depression, seemingly worsening, need psychiatric evaluation, she does not appear to be an imminent threat to herself that she does have chronic suicidal thoughts but is now decompensating and feeling like she is unable to function independently and without having her anxiety treated. I will give her half a dose of Ativan while she is waiting for psychiatric consultation.  D/w TTS - they will attempt placement, pt informed  Eber Hong, MD 07/05/15 1513

## 2015-07-05 NOTE — ED Notes (Signed)
Pt comes in by EMS for suicidal thoughts. Per EMS pt does not have a plan. EMS states they called mobile crisis to come evaluate patient and was told to come here.

## 2015-07-06 DIAGNOSIS — K760 Fatty (change of) liver, not elsewhere classified: Secondary | ICD-10-CM | POA: Diagnosis not present

## 2015-07-06 DIAGNOSIS — E876 Hypokalemia: Secondary | ICD-10-CM | POA: Diagnosis not present

## 2015-07-06 DIAGNOSIS — J4520 Mild intermittent asthma, uncomplicated: Secondary | ICD-10-CM | POA: Diagnosis not present

## 2015-07-06 DIAGNOSIS — K219 Gastro-esophageal reflux disease without esophagitis: Secondary | ICD-10-CM | POA: Diagnosis not present

## 2015-07-06 NOTE — ED Notes (Signed)
Son is at bedside and is taking patients purse and medications home with him. She gave verbal permission for this and items were given to son in front of the patient.

## 2015-07-08 DIAGNOSIS — K760 Fatty (change of) liver, not elsewhere classified: Secondary | ICD-10-CM | POA: Diagnosis not present

## 2015-07-08 DIAGNOSIS — N289 Disorder of kidney and ureter, unspecified: Secondary | ICD-10-CM | POA: Diagnosis not present

## 2015-07-08 DIAGNOSIS — I10 Essential (primary) hypertension: Secondary | ICD-10-CM | POA: Diagnosis not present

## 2015-07-08 DIAGNOSIS — R1084 Generalized abdominal pain: Secondary | ICD-10-CM | POA: Diagnosis not present

## 2015-07-09 ENCOUNTER — Encounter (HOSPITAL_COMMUNITY): Payer: Medicare Other

## 2015-07-10 DIAGNOSIS — K5901 Slow transit constipation: Secondary | ICD-10-CM | POA: Diagnosis not present

## 2015-07-10 DIAGNOSIS — I10 Essential (primary) hypertension: Secondary | ICD-10-CM | POA: Diagnosis not present

## 2015-07-10 DIAGNOSIS — K219 Gastro-esophageal reflux disease without esophagitis: Secondary | ICD-10-CM | POA: Diagnosis not present

## 2015-07-10 DIAGNOSIS — J4520 Mild intermittent asthma, uncomplicated: Secondary | ICD-10-CM | POA: Diagnosis not present

## 2015-07-12 DIAGNOSIS — I10 Essential (primary) hypertension: Secondary | ICD-10-CM | POA: Diagnosis not present

## 2015-07-12 DIAGNOSIS — D649 Anemia, unspecified: Secondary | ICD-10-CM | POA: Diagnosis not present

## 2015-07-12 DIAGNOSIS — K219 Gastro-esophageal reflux disease without esophagitis: Secondary | ICD-10-CM | POA: Diagnosis not present

## 2015-07-12 DIAGNOSIS — K5901 Slow transit constipation: Secondary | ICD-10-CM | POA: Diagnosis not present

## 2015-07-13 DIAGNOSIS — D649 Anemia, unspecified: Secondary | ICD-10-CM | POA: Diagnosis not present

## 2015-07-13 DIAGNOSIS — K219 Gastro-esophageal reflux disease without esophagitis: Secondary | ICD-10-CM | POA: Diagnosis not present

## 2015-07-13 DIAGNOSIS — K760 Fatty (change of) liver, not elsewhere classified: Secondary | ICD-10-CM | POA: Diagnosis not present

## 2015-07-13 DIAGNOSIS — I10 Essential (primary) hypertension: Secondary | ICD-10-CM | POA: Diagnosis not present

## 2015-07-14 DIAGNOSIS — I10 Essential (primary) hypertension: Secondary | ICD-10-CM | POA: Diagnosis not present

## 2015-07-14 DIAGNOSIS — K760 Fatty (change of) liver, not elsewhere classified: Secondary | ICD-10-CM | POA: Diagnosis not present

## 2015-07-14 DIAGNOSIS — K219 Gastro-esophageal reflux disease without esophagitis: Secondary | ICD-10-CM | POA: Diagnosis not present

## 2015-07-14 DIAGNOSIS — D649 Anemia, unspecified: Secondary | ICD-10-CM | POA: Diagnosis not present

## 2015-07-15 DIAGNOSIS — K219 Gastro-esophageal reflux disease without esophagitis: Secondary | ICD-10-CM | POA: Diagnosis not present

## 2015-07-15 DIAGNOSIS — I10 Essential (primary) hypertension: Secondary | ICD-10-CM | POA: Diagnosis not present

## 2015-07-15 DIAGNOSIS — D649 Anemia, unspecified: Secondary | ICD-10-CM | POA: Diagnosis not present

## 2015-07-15 DIAGNOSIS — K760 Fatty (change of) liver, not elsewhere classified: Secondary | ICD-10-CM | POA: Diagnosis not present

## 2015-07-17 DIAGNOSIS — I10 Essential (primary) hypertension: Secondary | ICD-10-CM | POA: Diagnosis not present

## 2015-07-17 DIAGNOSIS — K760 Fatty (change of) liver, not elsewhere classified: Secondary | ICD-10-CM | POA: Diagnosis not present

## 2015-07-17 DIAGNOSIS — K219 Gastro-esophageal reflux disease without esophagitis: Secondary | ICD-10-CM | POA: Diagnosis not present

## 2015-07-17 DIAGNOSIS — D649 Anemia, unspecified: Secondary | ICD-10-CM | POA: Diagnosis not present

## 2015-07-20 DIAGNOSIS — D649 Anemia, unspecified: Secondary | ICD-10-CM | POA: Diagnosis not present

## 2015-07-20 DIAGNOSIS — K5901 Slow transit constipation: Secondary | ICD-10-CM | POA: Diagnosis not present

## 2015-07-20 DIAGNOSIS — I10 Essential (primary) hypertension: Secondary | ICD-10-CM | POA: Diagnosis not present

## 2015-07-22 DIAGNOSIS — I10 Essential (primary) hypertension: Secondary | ICD-10-CM | POA: Diagnosis not present

## 2015-07-22 DIAGNOSIS — K5901 Slow transit constipation: Secondary | ICD-10-CM | POA: Diagnosis not present

## 2015-07-22 DIAGNOSIS — D649 Anemia, unspecified: Secondary | ICD-10-CM | POA: Diagnosis not present

## 2015-07-22 DIAGNOSIS — K760 Fatty (change of) liver, not elsewhere classified: Secondary | ICD-10-CM | POA: Diagnosis not present

## 2015-07-25 DIAGNOSIS — E785 Hyperlipidemia, unspecified: Secondary | ICD-10-CM | POA: Diagnosis not present

## 2015-07-25 DIAGNOSIS — Z96651 Presence of right artificial knee joint: Secondary | ICD-10-CM | POA: Diagnosis not present

## 2015-07-25 DIAGNOSIS — Z471 Aftercare following joint replacement surgery: Secondary | ICD-10-CM | POA: Diagnosis not present

## 2015-07-25 DIAGNOSIS — M1990 Unspecified osteoarthritis, unspecified site: Secondary | ICD-10-CM | POA: Diagnosis not present

## 2015-07-25 DIAGNOSIS — J45909 Unspecified asthma, uncomplicated: Secondary | ICD-10-CM | POA: Diagnosis not present

## 2015-07-27 DIAGNOSIS — M1990 Unspecified osteoarthritis, unspecified site: Secondary | ICD-10-CM | POA: Diagnosis not present

## 2015-07-27 DIAGNOSIS — Z96651 Presence of right artificial knee joint: Secondary | ICD-10-CM | POA: Diagnosis not present

## 2015-07-27 DIAGNOSIS — E785 Hyperlipidemia, unspecified: Secondary | ICD-10-CM | POA: Diagnosis not present

## 2015-07-27 DIAGNOSIS — Z471 Aftercare following joint replacement surgery: Secondary | ICD-10-CM | POA: Diagnosis not present

## 2015-07-27 DIAGNOSIS — J45909 Unspecified asthma, uncomplicated: Secondary | ICD-10-CM | POA: Diagnosis not present

## 2015-07-30 ENCOUNTER — Encounter: Payer: Self-pay | Admitting: Family Medicine

## 2015-07-30 ENCOUNTER — Ambulatory Visit (INDEPENDENT_AMBULATORY_CARE_PROVIDER_SITE_OTHER): Payer: Medicare Other | Admitting: Family Medicine

## 2015-07-30 ENCOUNTER — Ambulatory Visit: Payer: Medicare Other | Admitting: Family Medicine

## 2015-07-30 VITALS — BP 140/89 | HR 77 | Temp 98.9°F | Ht 66.0 in | Wt 247.6 lb

## 2015-07-30 DIAGNOSIS — F419 Anxiety disorder, unspecified: Secondary | ICD-10-CM

## 2015-07-30 DIAGNOSIS — F329 Major depressive disorder, single episode, unspecified: Secondary | ICD-10-CM | POA: Diagnosis not present

## 2015-07-30 DIAGNOSIS — R419 Unspecified symptoms and signs involving cognitive functions and awareness: Secondary | ICD-10-CM | POA: Diagnosis not present

## 2015-07-30 DIAGNOSIS — F32A Depression, unspecified: Secondary | ICD-10-CM

## 2015-07-30 DIAGNOSIS — R1310 Dysphagia, unspecified: Secondary | ICD-10-CM

## 2015-07-30 MED ORDER — POTASSIUM CHLORIDE ER 10 MEQ PO TBCR: 10.0000 meq | EXTENDED_RELEASE_TABLET | ORAL | Status: DC

## 2015-07-30 MED ORDER — HYDROXYZINE HCL 25 MG PO TABS: 25.0000 mg | ORAL_TABLET | Freq: Three times a day (TID) | ORAL | Status: AC | PRN

## 2015-07-30 MED ORDER — PANTOPRAZOLE SODIUM 40 MG PO TBEC: 40.0000 mg | DELAYED_RELEASE_TABLET | Freq: Every day | ORAL | Status: DC

## 2015-07-30 NOTE — Progress Notes (Signed)
Primary Physician: Frederica KusterMILLER, STEPHEN M, MD  Chief Complaint: 65 year old female here for follow-up hospitalization. She had been seen multiple times leading up to the hospitalization for GI-related complaints as well as anxiety but she was hospitalized at behavioral health in [place] for depression and anxiety. Some of her medications were adjusted and others were added. She was discharged on March 8 after approximate 2 week stay Outwardly, today she appears somewhat resigned but that could mean she is more sedated. In addition to Effexor, she takes Abilify, hydroxyzine, as well as her Klonopin which she was on previously. She has no complaints with her stomach today which is surprising but I think suggests that her complaints may be more somatic than organic.     Past Medical History  Diagnosis Date  . Anxiety   . H/O: knee surgery   . Hypertension   . Allergy   . Depression   . Asthma   . Osteoarthritis   . Palpitations   . Osteopenia   . Hemorrhoid   . Panic attack   . Hypercholesteremia   . GERD (gastroesophageal reflux disease)      Home Meds: Prior to Admission medications   Medication Sig Start Date End Date Taking? Authorizing Provider  amLODipine-benazepril (LOTREL) 5-20 MG capsule TAKE 1 CAPSULE BY MOUTH DAILY. 04/20/15  Yes Frederica KusterStephen M Miller, MD  ARIPiprazole (ABILIFY) 5 MG tablet Take 5 mg by mouth daily.   Yes Historical Provider, MD  aspirin EC 81 MG tablet Take 81 mg by mouth every morning.    Yes Historical Provider, MD  atorvastatin (LIPITOR) 40 MG tablet TAKE 1 TABLET (40 MG TOTAL) BY MOUTH DAILY. 04/23/15  Yes Frederica KusterStephen M Miller, MD  Cholecalciferol (VITAMIN D) 2000 UNITS CAPS Take 1 capsule by mouth daily.   Yes Historical Provider, MD  clonazePAM (KLONOPIN) 1 MG tablet Take 1 mg by mouth 3 (three) times daily. 07/22/15  Yes Historical Provider, MD  diclofenac sodium (VOLTAREN) 1 % GEL Apply 4 g topically 4 (four) times daily.   Yes Historical Provider, MD   dicyclomine (BENTYL) 10 MG capsule Take 1 capsule by mouth. 30 minutes before meals 07/22/15 07/21/16 Yes Historical Provider, MD  docusate sodium (COLACE) 100 MG capsule Take 100 mg by mouth daily as needed for mild constipation.    Yes Historical Provider, MD  esomeprazole (NEXIUM) 40 MG capsule Take 1 capsule (40 mg total) by mouth daily. 03/23/15  Yes Frederica KusterStephen M Miller, MD  furosemide (LASIX) 20 MG tablet Take 1 tablet (20 mg total) by mouth every other day. 07/02/14  Yes Iva Booparl E Gessner, MD  HYDROcodone-acetaminophen (NORCO) 7.5-325 MG per tablet Take 1 tablet by mouth every 4 (four) hours as needed for moderate pain.    Yes Historical Provider, MD  hydrOXYzine (ATARAX/VISTARIL) 25 MG tablet Take 1 tablet by mouth 3 (three) times daily as needed. 07/22/15 08/01/15 Yes Historical Provider, MD  meloxicam (MOBIC) 15 MG tablet TAKE 1 TABLET (15 MG TOTAL) BY MOUTH DAILY. 02/04/15  Yes Frederica KusterStephen M Miller, MD  montelukast (SINGULAIR) 10 MG tablet TAKE 1 TABLET (10 MG TOTAL) BY MOUTH AT BEDTIME. 04/20/15  Yes Frederica KusterStephen M Miller, MD  naphazoline-pheniramine (NAPHCON-A) 0.025-0.3 % ophthalmic solution Place 2 drops into both eyes 4 (four) times daily as needed. 07/22/15 08/01/15 Yes Historical Provider, MD  pantoprazole (PROTONIX) 40 MG tablet Take 1 tablet by mouth daily. For stomach 07/04/15  Yes Historical Provider, MD  polyethylene glycol (MIRALAX / GLYCOLAX) packet Take 1 packet by mouth daily. As  needed for constipation 07/22/15 08/21/15 Yes Historical Provider, MD  potassium chloride (K-DUR) 10 MEQ tablet Take 1 tablet by mouth every other day. 07/22/15 07/21/16 Yes Historical Provider, MD  tiZANidine (ZANAFLEX) 4 MG tablet Take 4 mg by mouth at bedtime.   Yes Historical Provider, MD  traZODone (DESYREL) 100 MG tablet Take 1 tablet by mouth at bedtime. 07/22/15 08/21/15 Yes Historical Provider, MD  venlafaxine XR (EFFEXOR-XR) 75 MG 24 hr capsule Take 3 capsules by mouth daily. 07/22/15 07/21/16 Yes Historical Provider, MD  VIIBRYD 40  MG TABS TAKE 1 TABLET (40 MG TOTAL) BY MOUTH DAILY. Patient not taking: Reported on 07/30/2015 06/03/15   Frederica Kuster, MD    Allergies:  Allergies  Allergen Reactions  . Wellbutrin [Bupropion Hcl] Nausea Only    Social History   Social History  . Marital Status: Divorced    Spouse Name: N/A  . Number of Children: N/A  . Years of Education: N/A   Occupational History  . Not on file.   Social History Main Topics  . Smoking status: Never Smoker   . Smokeless tobacco: Never Used  . Alcohol Use: No  . Drug Use: No  . Sexual Activity: No   Other Topics Concern  . Not on file   Social History Narrative     Review of Systems: Constitutional: negative for chills, fever, night sweats, weight changes, or fatigue  HEENT: negative for vision changes, hearing loss, congestion, rhinorrhea, ST, epistaxis, or sinus pressure Cardiovascular: negative for chest pain or palpitations Respiratory: negative for hemoptysis, wheezing, shortness of breath, or cough Abdominal: negative for abdominal pain, nausea, vomiting, diarrhea, or constipation Dermatological: negative for rash Neurologic: negative for headache, dizziness, or syncope All other systems reviewed and are otherwise negative with the exception to those above and in the HPI.   Physical Exam: Blood pressure 140/89, pulse 77, temperature 98.9 F (37.2 C), temperature source Oral, height  (1.676 m), weight 247 lb 9.6 oz (112.311 kg), last menstrual period 08/09/2002., Body mass index is 39.98 kg/(m^2). General: Well developed, well nourished, in no acute distress. Head: Normocephalic, atraumatic, eyes without discharge, sclera non-icteric, nares are without discharge. Bilateral auditory canals clear, TM's are without perforation, pearly grey and translucent with reflective cone of light bilaterally. Oral cavity moist, posterior pharynx without exudate, erythema, peritonsillar abscess, or post nasal drip.  Neck: Supple. No  thyromegaly. Full ROM. No lymphadenopathy. Lungs: Clear bilaterally to auscultation without wheezes, rales, or rhonchi. Breathing is unlabored. Heart: RRR with S1 S2. No murmurs, rubs, or gallops appreciated. Abdomen: Soft, non-tender, non-distended with normoactive bowel sounds. No hepatomegaly. No rebound/guarding. No obvious abdominal masses. Msk:  Strength and tone normal for age. Extremities/Skin: Warm and dry. No clubbing or cyanosis. No edema. No rashes or suspicious lesions. Neuro: Alert and oriented X 3. Moves all extremities spontaneously. Gait is normal. CNII-XII grossly in tact. Psych:  Responds to questions appropriately with a normal affect.   Labs:   ASSESSMENT AND PLAN:  1. Anxiety He was to be doing well, at least for today. Continue on Klonopin hydroxyzine and Effexor and Abilify  2. Depression Assessment same as above. She is continuing outpatient counseling at [facility] and family.  3. Dysphagia Amazingly, no mention of GI complaints today. This makes me think treating her anxiety and depression may be helping GI problems and symptoms    07/30/2015 1:37 PM

## 2015-07-31 DIAGNOSIS — J45909 Unspecified asthma, uncomplicated: Secondary | ICD-10-CM | POA: Diagnosis not present

## 2015-07-31 DIAGNOSIS — Z96651 Presence of right artificial knee joint: Secondary | ICD-10-CM | POA: Diagnosis not present

## 2015-07-31 DIAGNOSIS — Z471 Aftercare following joint replacement surgery: Secondary | ICD-10-CM | POA: Diagnosis not present

## 2015-07-31 DIAGNOSIS — M1990 Unspecified osteoarthritis, unspecified site: Secondary | ICD-10-CM | POA: Diagnosis not present

## 2015-07-31 DIAGNOSIS — E785 Hyperlipidemia, unspecified: Secondary | ICD-10-CM | POA: Diagnosis not present

## 2015-08-04 ENCOUNTER — Ambulatory Visit: Payer: Medicare Other | Admitting: Family Medicine

## 2015-08-04 DIAGNOSIS — Z96651 Presence of right artificial knee joint: Secondary | ICD-10-CM | POA: Diagnosis not present

## 2015-08-04 DIAGNOSIS — J45909 Unspecified asthma, uncomplicated: Secondary | ICD-10-CM | POA: Diagnosis not present

## 2015-08-04 DIAGNOSIS — E785 Hyperlipidemia, unspecified: Secondary | ICD-10-CM | POA: Diagnosis not present

## 2015-08-04 DIAGNOSIS — M1990 Unspecified osteoarthritis, unspecified site: Secondary | ICD-10-CM | POA: Diagnosis not present

## 2015-08-04 DIAGNOSIS — Z471 Aftercare following joint replacement surgery: Secondary | ICD-10-CM | POA: Diagnosis not present

## 2015-08-05 ENCOUNTER — Ambulatory Visit (INDEPENDENT_AMBULATORY_CARE_PROVIDER_SITE_OTHER): Payer: Medicare Other | Admitting: Gastroenterology

## 2015-08-05 ENCOUNTER — Encounter: Payer: Self-pay | Admitting: Gastroenterology

## 2015-08-05 VITALS — BP 135/83 | HR 69 | Temp 98.2°F | Ht 66.0 in | Wt 273.0 lb

## 2015-08-05 DIAGNOSIS — K5909 Other constipation: Secondary | ICD-10-CM

## 2015-08-05 DIAGNOSIS — K5900 Constipation, unspecified: Secondary | ICD-10-CM | POA: Insufficient documentation

## 2015-08-05 MED ORDER — LINACLOTIDE 145 MCG PO CAPS: ORAL_CAPSULE | ORAL | Status: DC

## 2015-08-05 NOTE — Progress Notes (Signed)
CC'D TO PCP °

## 2015-08-05 NOTE — Assessment & Plan Note (Signed)
SYMPTOMS NOT CONTROLLED AND MOST LIKELY DUE TO AGE, IMMOBILITY, AND MEDS: BENTYL, NORCO, PSYCHOTROPIC DRUGS  DRINK WATER TO KEEP YOUR URINE LIGHT YELLOW. FOLLOW A HIGH FIBER DIET. AVOID ITEMS THAT CAUSE BLOATING & GAS. SEE INFO BELOW. ADD LINZESS 30 MINS PRIOR TO BREAKFAST. IT MAY CAUSE EXPLOSIVE DIARRHEA. IF YOU ARE NOT HAVING A BOWEL MOPVENET 2-3 DAYS A WEEK, TAKE LINZESS WITH FOOD. FOLLOW UP IN 3 MOS.   GREATER THAN 50% WAS SPENT IN COUNSELING & COORDINATION OF CARE WITH THE PATIENT & SON: DISCUSSED CAUSESE, BENEFITS, SIDE EFFECTS, AND MANAGEMENT OF CONSTIPATION. TOTAL ENCOUNTER TIME: 45 MINS.

## 2015-08-05 NOTE — Progress Notes (Signed)
On recall  °

## 2015-08-05 NOTE — Patient Instructions (Signed)
DRINK WATER TO KEEP YOUR URINE LIGHT YELLOW.  FOLLOW A HIGH FIBER DIET. AVOID ITEMS THAT CAUSE BLOATING & GAS. SEE INFO BELOW.  ADD LINZESS 30 MINS PRIOR TO BREAKFAST. IT MAY CAUSE EXPLOSIVE DIARRHEA. IF YOU ARE NOT HAVING A BOWEL MOPVENET 2-3 DAYS A WEEK, TAKE LINZESS WITH FOOD.  FOLLOW UP IN 3 MOS.   High-Fiber Diet A high-fiber diet changes your normal diet to include more whole grains, legumes, fruits, and vegetables. Changes in the diet involve replacing refined carbohydrates with unrefined foods. The calorie level of the diet is essentially unchanged. The Dietary Reference Intake (recommended amount) for adult males is 38 grams per day. For adult females, it is 25 grams per day. Pregnant and lactating women should consume 28 grams of fiber per day. Fiber is the intact part of a plant that is not broken down during digestion. Functional fiber is fiber that has been isolated from the plant to provide a beneficial effect in the body. PURPOSE  Increase stool bulk.   Ease and regulate bowel movements.   Lower cholesterol.  INDICATIONS THAT YOU NEED MORE FIBER  Constipation and hemorrhoids.   Uncomplicated diverticulosis (intestine condition) and irritable bowel syndrome.   Weight management.   As a protective measure against hardening of the arteries (atherosclerosis), diabetes, and cancer.   GUIDELINES FOR INCREASING FIBER IN THE DIET  Start adding fiber to the diet slowly. A gradual increase of about 5 more grams (2 slices of whole-wheat bread, 2 servings of most fruits or vegetables, or 1 bowl of high-fiber cereal) per day is best. Too rapid an increase in fiber may result in constipation, flatulence, and bloating.   Drink enough water and fluids to keep your urine clear or pale yellow. Water, juice, or caffeine-free drinks are recommended. Not drinking enough fluid may cause constipation.   Eat a variety of high-fiber foods rather than one type of fiber.   Try to increase  your intake of fiber through using high-fiber foods rather than fiber pills or supplements that contain small amounts of fiber.   The goal is to change the types of food eaten. Do not supplement your present diet with high-fiber foods, but replace foods in your present diet.   INCLUDE A VARIETY OF FIBER SOURCES  Replace refined and processed grains with whole grains, canned fruits with fresh fruits, and incorporate other fiber sources. White rice, white breads, and most bakery goods contain little or no fiber.   Brown whole-grain rice, buckwheat oats, and many fruits and vegetables are all good sources of fiber. These include: broccoli, Brussels sprouts, cabbage, cauliflower, beets, sweet potatoes, white potatoes (skin on), carrots, tomatoes, eggplant, squash, berries, fresh fruits, and dried fruits.   Cereals appear to be the richest source of fiber. Cereal fiber is found in whole grains and bran. Bran is the fiber-rich outer coat of cereal grain, which is largely removed in refining. In whole-grain cereals, the bran remains. In breakfast cereals, the largest amount of fiber is found in those with "bran" in their names. The fiber content is sometimes indicated on the label.   You may need to include additional fruits and vegetables each day.   In baking, for 1 cup white flour, you may use the following substitutions:   1 cup whole-wheat flour minus 2 tablespoons.   1/2 cup white flour plus 1/2 cup whole-wheat flour.   BLOATING AND GAS PREVENTION  Although gas may be uncomfortable and embarrassing, it is not life-threatening. Understanding causes, ways to  reduce symptoms, and treatment will help most people find some relief. Points to remember . Everyone has gas in the digestive tract. Marland Kitchen People often believe normal passage of gas to be excessive. . Gas comes from two main sources: swallowed air and normal breakdown of certain foods by harmless bacteria naturally present in the large  intestine. . Many foods with carbohydrates can cause gas. Fats and proteins cause little gas. . Foods that may cause gas include o beans  o vegetables, such as broccoli, cabbage, brussels sprouts, onions, artichokes, and asparagus  o fruits, such as pears, apples, and peaches  o whole grains, such as whole wheat and bran  o soft drinks and fruit drinks  o milk and milk products, such as cheese and ice cream, and packaged foods prepared with lactose, such as bread, cereal, and salad dressing  o foods containing sorbitol, such as dietetic foods and sugar free candies and gums . The most common symptoms of gas are belching, flatulence, bloating, and abdominal pain. However, some of these symptoms are often caused by an intestinal disorder, such as irritable bowel syndrome, rather than too much gas. . The most common ways to reduce the discomfort of gas are changing diet, taking nonprescription medicines, and reducing the amount of air swallowed. . Digestive enzymes, such as lactase supplements, actually help digest carbohydrates and may allow people to eat foods that normally cause gas.

## 2015-08-05 NOTE — Progress Notes (Signed)
Subjective:    Patient ID: Lauren Harmon, female    DOB: 10/10/1950, 65 y.o.   MRN: [id number]  Frederica KusterMILLER, STEPHEN M, MD   HPI FEW WEEKS HAD A SATISFACTORY BOWEL MOVEMENT. GOT SOME MEDS TO FLUSH HER OUT. MIRALAX WILL HELP WHEN SHE TAKES IT. HAS A LOT OF BLOATING AN GAS. STOMACH QUIVERS.HAD BOWEL PROBLEMS OFF AND ON FOR A LONG TIME. DOESN'T REALLY HAVE PAIN JUST THE FLUTTERING ESPECIALLY WHEN SHE GETS NERVOUS. NEXIUM CONTROLLING HER HEARTBURN.  EGD/DIL JAN 2016 HELPED SWALLOWING.  BMs: 1-2X/WEEK.   PT DENIES FEVER, CHILLS, HEMATOCHEZIA, HEMATEMESIS, nausea, vomiting, melena, diarrhea, CHEST PAIN, SHORTNESS OF BREATH, CHANGE IN BOWEL IN HABITS, abdominal pain, problems swallowing, problems with sedation, heartburn or indigestion.   Past Medical History  Diagnosis Date  . Anxiety   . H/O: knee surgery   . Hypertension   . Allergy   . Depression   . Asthma   . Osteoarthritis   . Palpitations   . Osteopenia   . Hemorrhoid   . Panic attack   . Hypercholesteremia   . GERD (gastroesophageal reflux disease)     Past Surgical History  Procedure Laterality Date  . Eye surgery    . Knee surgery    . Esophagogastroduodenoscopy N/A 07/02/2014    Procedure: ESOPHAGOGASTRODUODENOSCOPY (EGD);  Surgeon: Iva Booparl E Gessner, MD;  [facility];  Service: Endoscopy;  Laterality: N/A;  with possible dilation or botox  . Balloon dilation N/A 07/02/2014    Procedure: BALLOON DILATION;  Surgeon: Iva Booparl E Gessner, MD;  Location: [facility];  Service: Endoscopy;  Laterality: N/A;  . Botox injection N/A 07/02/2014    Procedure: BOTOX INJECTION;  Surgeon: Iva Booparl E Gessner, MD;  Location: [facility];  Service: Endoscopy;  Laterality: N/A;   Allergies  Allergen Reactions  . Wellbutrin [Bupropion Hcl] Nausea Only   Current Outpatient Prescriptions  Medication Sig Dispense Refill  . LOTREL 5-20 MG capsule TAKE 1 CAPSULE BY MOUTH DAILY.    Marland Kitchen. ABILIFY 5 MG tablet Take 5 mg by mouth daily.    Marland Kitchen.  aspirin EC 81 MG tablet Take 81 mg by mouth every morning.     Marland Kitchen. atorvastatin (LIPITOR) 40 MG tablet TAKE 1 TABLET (40 MG TOTAL) BY MOUTH DAILY.    Marland Kitchen. Cholecalciferol (VITAMIN D) 2000 UNITS CAPS Take 1 capsule by mouth daily.    Marland Kitchen. KLONOPIN 1 MG tablet Take 1 mg by mouth 3 (three) times daily.    . VOLTAREN 1 % GEL Apply 4 g topically 4 (four) times daily.    . BENTYL 10 MG capsule Take 1 capsule by mouth. Reported on 08/05/2015    . COLACE) 100 MG capsule Take 100 mg by mouth daily as needed for mild constipation.     Marland Kitchen. NEXIUM 40 MG capsule Take 1 capsule (40 mg total) by mouth daily.    . furosemide (LASIX) 20 MG tablet Take 1 tablet (20 mg total) by mouth every other day.    Marland Kitchen. HYDROcodone-acetaminophen (NORCO) 7.5-325 MG per tablet Take 1 tablet by mouth every 4 (four) hours as needed for moderate pain.     . hydrOXYzine (ATARAX/VISTARIL) 25 MG tablet Take 1 tablet (25 mg total) by mouth 3 (three) times daily as needed.    . meloxicam (MOBIC) 15 MG tablet TAKE 1 TABLET (15 MG TOTAL) BY MOUTH DAILY.    . montelukast (SINGULAIR) 10 MG tablet TAKE 1 TABLET (10 MG TOTAL) BY MOUTH AT BEDTIME.    Marland Kitchen. MIRALAX /  GLYCOLAX packet Take 1 packet by mouth daily. As needed for constipation    . K-DUR 10 MEQ tablet Take 1 tablet (10 mEq total) by mouth every other day.    Marland Kitchen tiZANidine (ZANAFLEX) 4 MG tablet Take 4 mg by mouth at bedtime.    . traZODone (DESYREL) 100 MG tablet Take 1 tablet by mouth at bedtime.    . EFFEXOR-XR 75 MG 24 hr capsule Take 3 capsules by mouth daily.    .      .       Family History  Problem Relation Age of Onset  . Osteoarthritis Mother   . Hypertension Mother   . Hypertension Father   . Hyperlipidemia Father   . Heart disease Father   . Osteoarthritis Father   . Lung cancer Maternal Uncle     smoker  . Colon polyps Neg Hx   . Colon cancer Neg Hx    Social History   Social History  . Marital Status: Divorced    Spouse Name: N/A  . Number of Children: N/A  . Years of  Education: N/A   Occupational History  . Used to work at [employer]. DISABILITY SINCE AGE 40s   Social History Main Topics  . Smoking status: Never Smoker   . Smokeless tobacco: Never Used  . Alcohol Use: No  . Drug Use: No  . Sexual Activity: No   Other Topics Concern   LIVES WITH HER ONLY SON(SAM).   Review of Systems PER HPI OTHERWISE ALL SYSTEMS ARE NEGATIVE. HAD SURGERY ON RIGHT LEG BUT NEED SURGERY ON LEFT KNEE. PRIMARILY CONFINED TO A WHEELCHAIR    Objective:   Physical Exam  Constitutional: She is oriented to person, place, and time. She appears well-developed and well-nourished. No distress.  HENT:  Head: Normocephalic and atraumatic.  Mouth/Throat: Oropharynx is clear and moist. No oropharyngeal exudate.  Eyes: Pupils are equal, round, and reactive to light. No scleral icterus.  Neck: Normal range of motion. Neck supple.  Cardiovascular: Normal rate, regular rhythm and normal heart sounds.   Pulmonary/Chest: Effort normal and breath sounds normal. No respiratory distress.  Abdominal: Soft. Bowel sounds are normal. She exhibits no distension. There is no tenderness.  EXAM LIMITED-[facility]   Musculoskeletal: She exhibits no edema.  UNABLE TO WALK  Lymphadenopathy:    She has no cervical adenopathy.  Neurological: She is alert and oriented to person, place, and time.  Psychiatric: She has a normal mood and affect.  Vitals reviewed.     Assessment & Plan:

## 2015-08-06 DIAGNOSIS — E785 Hyperlipidemia, unspecified: Secondary | ICD-10-CM | POA: Diagnosis not present

## 2015-08-06 DIAGNOSIS — J45909 Unspecified asthma, uncomplicated: Secondary | ICD-10-CM | POA: Diagnosis not present

## 2015-08-06 DIAGNOSIS — Z471 Aftercare following joint replacement surgery: Secondary | ICD-10-CM | POA: Diagnosis not present

## 2015-08-06 DIAGNOSIS — Z96651 Presence of right artificial knee joint: Secondary | ICD-10-CM | POA: Diagnosis not present

## 2015-08-06 DIAGNOSIS — M1990 Unspecified osteoarthritis, unspecified site: Secondary | ICD-10-CM | POA: Diagnosis not present

## 2015-08-12 ENCOUNTER — Ambulatory Visit: Payer: Self-pay | Admitting: Orthopaedic Surgery

## 2015-08-14 ENCOUNTER — Ambulatory Visit (INDEPENDENT_AMBULATORY_CARE_PROVIDER_SITE_OTHER): Payer: Medicare Other | Admitting: Family Medicine

## 2015-08-14 DIAGNOSIS — Z96651 Presence of right artificial knee joint: Secondary | ICD-10-CM | POA: Diagnosis not present

## 2015-08-14 DIAGNOSIS — F1021 Alcohol dependence, in remission: Secondary | ICD-10-CM

## 2015-08-14 DIAGNOSIS — Z471 Aftercare following joint replacement surgery: Secondary | ICD-10-CM

## 2015-08-14 DIAGNOSIS — M1990 Unspecified osteoarthritis, unspecified site: Secondary | ICD-10-CM

## 2015-08-15 ENCOUNTER — Other Ambulatory Visit: Payer: Self-pay | Admitting: Family Medicine

## 2015-08-19 ENCOUNTER — Telehealth: Payer: Self-pay | Admitting: Family Medicine

## 2015-08-19 ENCOUNTER — Ambulatory Visit: Payer: Self-pay | Admitting: Orthopaedic Surgery

## 2015-08-19 DIAGNOSIS — Z743 Need for continuous supervision: Secondary | ICD-10-CM | POA: Diagnosis not present

## 2015-08-19 DIAGNOSIS — Z8781 Personal history of (healed) traumatic fracture: Secondary | ICD-10-CM | POA: Diagnosis not present

## 2015-08-19 DIAGNOSIS — R279 Unspecified lack of coordination: Secondary | ICD-10-CM | POA: Diagnosis not present

## 2015-08-19 DIAGNOSIS — Z967 Presence of other bone and tendon implants: Secondary | ICD-10-CM | POA: Diagnosis not present

## 2015-08-19 DIAGNOSIS — M818 Other osteoporosis without current pathological fracture: Secondary | ICD-10-CM | POA: Diagnosis not present

## 2015-08-19 MED ORDER — CLONAZEPAM 1 MG PO TABS: 1.0000 mg | ORAL_TABLET | Freq: Three times a day (TID) | ORAL | Status: DC

## 2015-08-19 NOTE — Telephone Encounter (Signed)
Rx refilled at pt request.

## 2015-08-19 NOTE — Telephone Encounter (Signed)
Refilled called to [facility]

## 2015-08-25 ENCOUNTER — Ambulatory Visit (INDEPENDENT_AMBULATORY_CARE_PROVIDER_SITE_OTHER): Payer: Medicare Other | Admitting: Orthopaedic Surgery

## 2015-08-25 VITALS — BP 151/85 | HR 75 | Temp 98.1°F | Ht 66.0 in | Wt 273.0 lb

## 2015-08-25 DIAGNOSIS — M25562 Pain in left knee: Secondary | ICD-10-CM

## 2015-08-25 DIAGNOSIS — I10 Essential (primary) hypertension: Secondary | ICD-10-CM | POA: Diagnosis not present

## 2015-08-25 DIAGNOSIS — E669 Obesity, unspecified: Secondary | ICD-10-CM

## 2015-08-25 NOTE — Progress Notes (Signed)
Patient ZO:XWRUEAV Herbert Harmon, female DOB:05-Jun-1950, 65 y.o. WUJ:[id number]  Chief Complaint  Patient presents with  . Follow-up    Left Knee pain    HPI  Lauren Harmon is a 65 y.o. female who has had left knee pain and right knee pain. She had surgery at [facility] on the right knee and distal femur after a severe injury there.  She was just seen at [facility] by Dr. Duffy Rhody two weeks ago on the right knee.  She was supposed to be seen and evaluated by him for the left knee and possible knee replacement.  She says he did not look at the left knee or the MRI she brought.  I had scheduled her to be seen by him for the left knee.  I do not know what happened.  We called [facility] but they wanted a medical release even though we had sent her there for the left knee evaluation.  It was sent.  We waited a while but we did not hear back from them.  I will review the notes when they arrive.  I will ask again that they evaluate her for left knee possible surgery.  She has lost 40 pounds and is in better health now.  She does not have diabetes.  She is watching what she eats.  She has her hypertension under good control now and has no leg swellings.  She has less depression and anxiety now.  She is comfortable in discussing surgery on the left knee.  Her left knee is swollen and tender.  She has a large effusion.  She has crepitus. She has no new trauma.  She has tried ice, heat, rest, medicine with little help.  HPI  Body mass index is 44.08 kg/(m^2).   Review of Systems  HENT: Negative for congestion.   Respiratory: Negative for cough and shortness of breath.   Cardiovascular: Negative for chest pain and leg swelling.  Endocrine: Positive for cold intolerance.  Musculoskeletal: Positive for myalgias, back pain, joint swelling, arthralgias and gait problem.  Allergic/Immunologic: Positive for environmental allergies.  Psychiatric/Behavioral: The patient is nervous/anxious.     Past Medical History   Diagnosis Date  . Anxiety   . H/O: knee surgery   . Hypertension   . Allergy   . Depression   . Asthma   . Osteoarthritis   . Palpitations   . Osteopenia   . Hemorrhoid   . Panic attack   . Hypercholesteremia   . GERD (gastroesophageal reflux disease)     Past Surgical History  Procedure Laterality Date  . Eye surgery    . Knee surgery    . Esophagogastroduodenoscopy N/A 07/02/2014    Procedure: ESOPHAGOGASTRODUODENOSCOPY (EGD);  Surgeon: Iva Boop, MD;  [facility];  Service: Endoscopy;  Laterality: N/A;  with possible dilation or botox  . Balloon dilation N/A 07/02/2014    Procedure: BALLOON DILATION;  Surgeon: Iva Boop, MD;  [facility];  Service: Endoscopy;  Laterality: N/A;  . Botox injection N/A 07/02/2014    Procedure: BOTOX INJECTION;  Surgeon: Iva Boop, MD;  Location: [facility];  Service: Endoscopy;  Laterality: N/A;    Family History  Problem Relation Age of Onset  . Osteoarthritis Mother   . Hypertension Mother   . Hypertension Father   . Hyperlipidemia Father   . Heart disease Father   . Osteoarthritis Father   . Lung cancer Maternal Uncle     smoker  . Colon polyps Neg  Hx   . Colon cancer Neg Hx     Social History Social History  Substance Use Topics  . Smoking status: Never Smoker   . Smokeless tobacco: Never Used  . Alcohol Use: No    Allergies  Allergen Reactions  . Wellbutrin [Bupropion Hcl] Nausea Only    Current Outpatient Prescriptions  Medication Sig Dispense Refill  . amLODipine-benazepril (LOTREL) 5-10 MG capsule TAKE 1 CAPSULE EVERY DAY 30 capsule 5  . amLODipine-benazepril (LOTREL) 5-20 MG capsule TAKE 1 CAPSULE BY MOUTH DAILY. 90 capsule 1  . ARIPiprazole (ABILIFY) 5 MG tablet Take 5 mg by mouth daily.    Marland Kitchen aspirin EC 81 MG tablet Take 81 mg by mouth every morning.     Marland Kitchen atorvastatin (LIPITOR) 40 MG tablet TAKE 1 TABLET (40 MG TOTAL) BY MOUTH DAILY. 90 tablet 0  . Cholecalciferol (VITAMIN  D) 2000 UNITS CAPS Take 1 capsule by mouth daily.    . clonazePAM (KLONOPIN) 1 MG tablet Take 1 tablet (1 mg total) by mouth 3 (three) times daily. 30 tablet 0  . diclofenac sodium (VOLTAREN) 1 % GEL Apply 4 g topically 4 (four) times daily.    Marland Kitchen dicyclomine (BENTYL) 10 MG capsule Take 1 capsule by mouth. Reported on 08/05/2015    . docusate sodium (COLACE) 100 MG capsule Take 100 mg by mouth daily as needed for mild constipation.     Marland Kitchen esomeprazole (NEXIUM) 40 MG capsule Take 1 capsule (40 mg total) by mouth daily. 90 capsule 1  . furosemide (LASIX) 20 MG tablet Take 1 tablet (20 mg total) by mouth every other day. 30 tablet 5  . HYDROcodone-acetaminophen (NORCO) 7.5-325 MG per tablet Take 1 tablet by mouth every 4 (four) hours as needed for moderate pain.     . Linaclotide (LINZESS) 145 MCG CAPS capsule 1 PO 30 mins prior to your first meal 30 capsule 11  . meloxicam (MOBIC) 15 MG tablet TAKE 1 TABLET (15 MG TOTAL) BY MOUTH DAILY. 90 tablet 2  . montelukast (SINGULAIR) 10 MG tablet TAKE 1 TABLET (10 MG TOTAL) BY MOUTH AT BEDTIME. 90 tablet 1  . pantoprazole (PROTONIX) 40 MG tablet Take 1 tablet (40 mg total) by mouth daily. For stomach 30 tablet 2  . potassium chloride (K-DUR) 10 MEQ tablet Take 1 tablet (10 mEq total) by mouth every other day. 30 tablet 2  . tiZANidine (ZANAFLEX) 4 MG tablet Take 4 mg by mouth at bedtime.    Marland Kitchen venlafaxine XR (EFFEXOR-XR) 75 MG 24 hr capsule Take 3 capsules by mouth daily.    Marland Kitchen VIIBRYD 40 MG TABS TAKE 1 TABLET (40 MG TOTAL) BY MOUTH DAILY. 30 tablet 1  . traZODone (DESYREL) 100 MG tablet Take 1 tablet by mouth at bedtime.     No current facility-administered medications for this visit.     Physical Exam  Blood pressure 151/85, pulse 75, temperature 98.1 F (36.7 C), height  (1.676 m), weight 273 lb (123.832 kg), last menstrual period 08/09/2002.  Constitutional: overall normal hygiene, normal nutrition, well developed, normal grooming, normal body  habitus. Assistive device:she is in a wheelchair today  Musculoskeletal: gait and station Limp in wheelchair, muscle tone and strength are normal, no tremors or atrophy is present.  .  Neurological: coordination overall normal.  Deep tendon reflex/nerve stretch intact.  Sensation normal.  Cranial nerves II-XII intact.   Skin:   Scars right knee, but otherwise overall no scars, lesions, ulcers or rashes. No psoriasis.  Psychiatric:  Alert and oriented x 3.  Recent memory intact, remote memory unclear.  Normal mood and affect. Well groomed.  Good eye contact.  Cardiovascular: overall no swelling, no varicosities, no edema bilaterally, normal temperatures of the legs and arms, no clubbing, cyanosis and good capillary refill.  Lymphatic: palpation is normal.  The left lower extremity is examined:  Inspection:  Thigh:  Non-tender and no defects  Knee has swelling 2+ effusion.                        Joint tenderness is present                        Patient is tender over the medial joint line  Lower Leg:  Has normal appearance and no tenderness or defects  Ankle:  Non-tender and no defects  Foot:  Non-tender and no defects Range of Motion:  Knee:  Range of motion is: 0-95                        Crepitus is  present  Ankle:  Range of motion is normal. Strength and Tone:  The left lower extremity has normal strength and tone. Stability:  Knee:  The knee is stable.  Ankle:  The ankle is stable.   The patient has been educated about the nature of the problem(s) and counseled on treatment options.  The patient appeared to understand what I have discussed and is in agreement with it.  Encounter Diagnoses  Name Primary?  . Left knee pain Yes  . Essential hypertension   . Obesity    PROCEDURE NOTE:  The patient requests injections of the left knee , verbal consent was obtained.  The left knee was prepped appropriately after time out was performed.   Sterile technique was observed  and injection of 1 cc of Depo-Medrol 40 mg with several cc's of plain xylocaine. Anesthesia was provided by ethyl chloride and a 20-gauge needle was used to inject the knee area. The injection was tolerated well.  A band aid dressing was applied.  The patient was advised to apply ice later today and tomorrow to the injection sight as needed.  PLAN Call if any problems.  Precautions discussed.  Continue current medications.   Return to clinic 6 weeks   [facility].  Patient agreeable.

## 2015-08-26 ENCOUNTER — Ambulatory Visit: Payer: Self-pay | Admitting: Orthopaedic Surgery

## 2015-08-28 ENCOUNTER — Other Ambulatory Visit: Payer: Self-pay | Admitting: Family Medicine

## 2015-08-31 NOTE — Telephone Encounter (Signed)
Last seen 07/30/15  Dr Hyacinth MeekerMiller  If approved route to  [facility]

## 2015-09-01 ENCOUNTER — Encounter: Payer: Self-pay | Admitting: Family Medicine

## 2015-09-01 ENCOUNTER — Ambulatory Visit (INDEPENDENT_AMBULATORY_CARE_PROVIDER_SITE_OTHER): Payer: Medicare Other | Admitting: Family Medicine

## 2015-09-01 VITALS — BP 150/92 | HR 70 | Temp 97.9°F | Ht 66.0 in | Wt 273.0 lb

## 2015-09-01 DIAGNOSIS — F32A Depression, unspecified: Secondary | ICD-10-CM

## 2015-09-01 DIAGNOSIS — F329 Major depressive disorder, single episode, unspecified: Secondary | ICD-10-CM | POA: Diagnosis not present

## 2015-09-01 DIAGNOSIS — F419 Anxiety disorder, unspecified: Secondary | ICD-10-CM

## 2015-09-01 NOTE — Progress Notes (Signed)
Subjective:    Patient ID: Lauren Harmon, female    DOB: 13-Apr-1951, 65 y.o.   MRN: [id number]  HPI Patient here today for 1 mo follow up on depression and anxiety. She was hospitalized at [facility] for her major depression been given Effexor and Abilify. As noted on numerous previous visits she always complains about the medicine and it never works and that is the basis of her visit today. She feels groggy and tired most of the day and she thinks she needs to stop the Effexor. More likely than the Effexor is Abilify to be causing the symptoms I explained this to her several times during the visit and I am still not sure she understands. She says her nerves or anxiety are controlled by Klonopin at this point in time. Still not sleeping and still has apathy and loss of interest in general.     Patient Active Problem List   Diagnosis Date Noted  . Constipation 08/05/2015  . Anxiety   . GAD (generalized anxiety disorder) 02/11/2013  . Hypertension 09/21/2010  . Hyperlipidemia 09/21/2010  . Osteoarthritis 09/21/2010  . Depression 09/21/2010  . Asthma 09/21/2010  . Obesity 09/21/2010  . Osteopenia 09/21/2010  . Hemorrhoids 09/21/2010  . Lumbar disc disease 09/21/2010   Outpatient Encounter Prescriptions as of 09/01/2015  Medication Sig  . amLODipine-benazepril (LOTREL) 5-10 MG capsule TAKE 1 CAPSULE EVERY DAY  . ARIPiprazole (ABILIFY) 5 MG tablet Take 5 mg by mouth daily.  Marland Kitchen aspirin EC 81 MG tablet Take 81 mg by mouth every morning.   Marland Kitchen atorvastatin (LIPITOR) 40 MG tablet TAKE 1 TABLET (40 MG TOTAL) BY MOUTH DAILY.  Marland Kitchen Cholecalciferol (VITAMIN D) 2000 UNITS CAPS Take 1 capsule by mouth daily.  . clonazePAM (KLONOPIN) 1 MG tablet TAKE 1 TABLET BY MOUTH 3 TIMES A DAY  . diclofenac sodium (VOLTAREN) 1 % GEL Apply 4 g topically 4 (four) times daily.  Marland Kitchen dicyclomine (BENTYL) 10 MG capsule Take 1 capsule by mouth. Reported on 08/05/2015  . docusate sodium (COLACE) 100 MG capsule Take 100  mg by mouth daily as needed for mild constipation.   Marland Kitchen esomeprazole (NEXIUM) 40 MG capsule Take 1 capsule (40 mg total) by mouth daily.  . furosemide (LASIX) 20 MG tablet Take 1 tablet (20 mg total) by mouth every other day.  Marland Kitchen HYDROcodone-acetaminophen (NORCO) 7.5-325 MG per tablet Take 1 tablet by mouth every 4 (four) hours as needed for moderate pain.   . Linaclotide (LINZESS) 145 MCG CAPS capsule 1 PO 30 mins prior to your first meal  . meloxicam (MOBIC) 15 MG tablet TAKE 1 TABLET (15 MG TOTAL) BY MOUTH DAILY.  . montelukast (SINGULAIR) 10 MG tablet TAKE 1 TABLET (10 MG TOTAL) BY MOUTH AT BEDTIME.  . pantoprazole (PROTONIX) 40 MG tablet Take 1 tablet (40 mg total) by mouth daily. For stomach  . potassium chloride (K-DUR) 10 MEQ tablet Take 1 tablet (10 mEq total) by mouth every other day.  Marland Kitchen tiZANidine (ZANAFLEX) 4 MG tablet Take 4 mg by mouth at bedtime.  Marland Kitchen venlafaxine XR (EFFEXOR-XR) 75 MG 24 hr capsule Take 3 capsules by mouth daily.  Marland Kitchen VIIBRYD 40 MG TABS TAKE 1 TABLET (40 MG TOTAL) BY MOUTH DAILY.  . traZODone (DESYREL) 100 MG tablet Take 1 tablet by mouth at bedtime.  . [DISCONTINUED] amLODipine-benazepril (LOTREL) 5-20 MG capsule TAKE 1 CAPSULE BY MOUTH DAILY.   No facility-administered encounter medications on file as of 09/01/2015.      Review  of Systems  Constitutional: Positive for fatigue (not sleeping).  HENT: Negative.   Eyes: Negative.   Respiratory: Negative.   Cardiovascular: Negative.   Gastrointestinal: Negative.   Endocrine: Negative.   Genitourinary: Negative.   Musculoskeletal: Negative.   Skin: Negative.   Allergic/Immunologic: Negative.   Neurological: Negative.   Hematological: Negative.   Psychiatric/Behavioral: The patient is nervous/anxious.        Objective:   Physical Exam  Constitutional: She appears well-developed and well-nourished.  Cardiovascular: Normal rate and regular rhythm.   Pulmonary/Chest: Effort normal and breath sounds normal.    Psychiatric:  Mood affect is still one of depression with argumentative personality   BP 150/92 mmHg  Pulse 70  Temp(Src) 97.9 F (36.6 C) (Oral)  Ht 5\' 6"  (1.676 m)  Wt 273 lb (123.832 kg)  BMI 44.08 kg/m2  LMP 08/09/2002        Assessment & Plan:  1. Depression Although I would like her to continue Abilify I think given her symptoms we will probably taper that and leave the Effexor at the same dose today.  2. Anxiety Continue with Klonopin as before  Frederica KusterStephen M Miller MD

## 2015-09-01 NOTE — Patient Instructions (Signed)
Taper Abilify: If possible cut the tablet in half and take one half tablet a day for 3 days then skip one day, take another half, skip 2 days take another half, then discontinue. If unable to cut tablet in half taper this way: Take 1 tablet skip one day take 1 tablet 2 days take 1 tablet skip 3 days take 1 tablet skip 4 days, then discontinue

## 2015-09-15 ENCOUNTER — Other Ambulatory Visit: Payer: Self-pay | Admitting: Family Medicine

## 2015-09-16 ENCOUNTER — Other Ambulatory Visit: Payer: Self-pay | Admitting: *Deleted

## 2015-09-16 ENCOUNTER — Other Ambulatory Visit: Payer: Self-pay | Admitting: Family Medicine

## 2015-09-16 MED ORDER — ESOMEPRAZOLE MAGNESIUM 40 MG PO CPDR: 40.0000 mg | DELAYED_RELEASE_CAPSULE | Freq: Every day | ORAL | Status: DC

## 2015-09-22 ENCOUNTER — Encounter: Payer: Self-pay | Admitting: Orthopaedic Surgery

## 2015-09-22 ENCOUNTER — Ambulatory Visit (INDEPENDENT_AMBULATORY_CARE_PROVIDER_SITE_OTHER): Payer: Medicare Other

## 2015-09-22 ENCOUNTER — Ambulatory Visit (INDEPENDENT_AMBULATORY_CARE_PROVIDER_SITE_OTHER): Payer: Medicare Other | Admitting: Orthopaedic Surgery

## 2015-09-22 ENCOUNTER — Ambulatory Visit (HOSPITAL_COMMUNITY)
Admission: RE | Admit: 2015-09-22 | Discharge: 2015-09-22 | Disposition: A | Discharge status: Home / Self Care | Payer: Medicare Other | Source: Ambulatory Visit | Attending: Orthopaedic Surgery | Admitting: Orthopaedic Surgery

## 2015-09-22 ENCOUNTER — Telehealth: Payer: Self-pay | Admitting: Orthopaedic Surgery

## 2015-09-22 VITALS — BP 147/87 | HR 83 | Temp 97.9°F | Resp 16 | Ht 66.0 in | Wt 273.0 lb

## 2015-09-22 DIAGNOSIS — M5136 Other intervertebral disc degeneration, lumbar region: Secondary | ICD-10-CM | POA: Insufficient documentation

## 2015-09-22 DIAGNOSIS — I10 Essential (primary) hypertension: Secondary | ICD-10-CM

## 2015-09-22 DIAGNOSIS — M25562 Pain in left knee: Secondary | ICD-10-CM

## 2015-09-22 DIAGNOSIS — M25512 Pain in left shoulder: Secondary | ICD-10-CM | POA: Diagnosis not present

## 2015-09-22 DIAGNOSIS — E669 Obesity, unspecified: Secondary | ICD-10-CM

## 2015-09-22 DIAGNOSIS — M5442 Lumbago with sciatica, left side: Secondary | ICD-10-CM | POA: Insufficient documentation

## 2015-09-22 MED ORDER — TIZANIDINE HCL 4 MG PO TABS: ORAL_TABLET | ORAL | Status: DC

## 2015-09-22 NOTE — Telephone Encounter (Signed)
Ms. Lauren Harmon wants to speak with you Please give her a call at [phone]

## 2015-09-22 NOTE — Telephone Encounter (Signed)
Notes from today's office visit were also faxed.

## 2015-09-22 NOTE — Telephone Encounter (Signed)
I spoke with [name] and advised her that I had contacted [facility] and got their fax number to fax her notes to Dr. Josie Dixonavid Harmon(fax # [phone] phone # [phone]), and her appointment with him is 10/07/15 at 1:15 pm. Notes were re-faxed to the above number.

## 2015-09-22 NOTE — Progress Notes (Signed)
Patient ZO:XWRUEAV:Lauren Harmon, female DOB:08/10/1950, 65 y.o. WUJ:[id number]  Chief Complaint  Patient presents with  . Follow-up    left knee pain    HPI  Lauren Harmon is a 65 y.o. female who is here for follow-up to the left knee pain she has.  She had her notes sent to [facility] to Dr. Duffy RhodyStanley who operated on her right thigh and right knee from prior injury.  She is having more pain of the left knee with severe degenerative joint disease.  The knee is also swelling and popping and giving way.  I feel she is a candidate for a total knee on the left.  She has an appointment with Dr. Duffy RhodyStanley on May 24th.  I told her to call Dr. Lafonda MossesStanley's office and make sure the office got the notes from here.  She says she will do this prior to her appointment.  She has other new problems.  The first is pain in the left shoulder. She has had intermittent pain in the shoulder over the years. It has gotten worse over the last month.  She has no trauma.  She has problems in raising her shoulder over her head.  She has pain rolling on it at night.  She is uncomfortable. She has no swelling, no redness and no paresthesias of the left shoulder.  She has recurrent lower back pain as well.  She has had MRI of the lower back in the past and it showed a HNP.  She was seen by neurosurgery in [place] but she elected not to have surgery at that time and has "toughed it out" since then.  The pain has gotten worse with left sided sciatica now.  She has pain when she stands or bends.  She has no weakness and no bowel or bladder problem.    I have done x-rays of the left shoulder here in the office, reported separately.  She will get x-rays of the lumbar spine at the hospital as our unit cannot adequately get good quality secondary to her body habitus.  She understands.  I will review them after they are done.  HPI  Body mass index is 44.08 kg/(m^2).  Review of Systems  HENT: Negative for congestion.   Respiratory:  Negative for cough and shortness of breath.   Cardiovascular: Negative for chest pain and leg swelling.  Endocrine: Positive for cold intolerance.  Musculoskeletal: Positive for myalgias, back pain, joint swelling, arthralgias and gait problem.  Allergic/Immunologic: Positive for environmental allergies.  Psychiatric/Behavioral: The patient is nervous/anxious.     Past Medical History  Diagnosis Date  . Anxiety   . H/O: knee surgery   . Hypertension   . Allergy   . Depression   . Asthma   . Osteoarthritis   . Palpitations   . Osteopenia   . Hemorrhoid   . Panic attack   . Hypercholesteremia   . GERD (gastroesophageal reflux disease)     Past Surgical History  Procedure Laterality Date  . Eye surgery    . Knee surgery    . Esophagogastroduodenoscopy N/A 07/02/2014    Procedure: ESOPHAGOGASTRODUODENOSCOPY (EGD);  Surgeon: Iva Booparl E Gessner, MD;  [facility];  Service: Endoscopy;  Laterality: N/A;  with possible dilation or botox  . Balloon dilation N/A 07/02/2014    Procedure: BALLOON DILATION;  Surgeon: Iva Booparl E Gessner, MD;  Location: [facility];  Service: Endoscopy;  Laterality: N/A;  . Botox injection N/A 07/02/2014    Procedure: BOTOX INJECTION;  Surgeon: Iva Boop, MD;  [facility];  Service: Endoscopy;  Laterality: N/A;    Family History  Problem Relation Age of Onset  . Osteoarthritis Mother   . Hypertension Mother   . Hypertension Father   . Hyperlipidemia Father   . Heart disease Father   . Osteoarthritis Father   . Lung cancer Maternal Uncle     smoker  . Colon polyps Neg Hx   . Colon cancer Neg Hx     Social History Social History  Substance Use Topics  . Smoking status: Never Smoker   . Smokeless tobacco: Never Used  . Alcohol Use: No    Allergies  Allergen Reactions  . Wellbutrin [Bupropion Hcl] Nausea Only    Current Outpatient Prescriptions  Medication Sig Dispense Refill  . amLODipine-benazepril (LOTREL) 5-10 MG  capsule TAKE 1 CAPSULE EVERY DAY 30 capsule 5  . ARIPiprazole (ABILIFY) 5 MG tablet Take 5 mg by mouth daily.    Marland Kitchen aspirin EC 81 MG tablet Take 81 mg by mouth every morning.     Marland Kitchen atorvastatin (LIPITOR) 40 MG tablet TAKE 1 TABLET (40 MG TOTAL) BY MOUTH DAILY. 90 tablet 0  . Cholecalciferol (VITAMIN D) 2000 UNITS CAPS Take 1 capsule by mouth daily.    . clonazePAM (KLONOPIN) 1 MG tablet TAKE 1 TABLET BY MOUTH 3 TIMES A DAY 90 tablet 0  . diclofenac sodium (VOLTAREN) 1 % GEL Apply 4 g topically 4 (four) times daily.    Marland Kitchen dicyclomine (BENTYL) 10 MG capsule Take 1 capsule by mouth. Reported on 08/05/2015    . docusate sodium (COLACE) 100 MG capsule Take 100 mg by mouth daily as needed for mild constipation.     Marland Kitchen esomeprazole (NEXIUM) 40 MG capsule Take 1 capsule (40 mg total) by mouth daily. 90 capsule 1  . furosemide (LASIX) 20 MG tablet Take 1 tablet (20 mg total) by mouth every other day. 30 tablet 5  . HYDROcodone-acetaminophen (NORCO) 7.5-325 MG per tablet Take 1 tablet by mouth every 4 (four) hours as needed for moderate pain.     . Linaclotide (LINZESS) 145 MCG CAPS capsule 1 PO 30 mins prior to your first meal 30 capsule 11  . meloxicam (MOBIC) 15 MG tablet TAKE 1 TABLET EVERY DAY 30 tablet 1  . montelukast (SINGULAIR) 10 MG tablet TAKE 1 TABLET (10 MG TOTAL) BY MOUTH AT BEDTIME. 90 tablet 1  . pantoprazole (PROTONIX) 40 MG tablet Take 1 tablet (40 mg total) by mouth daily. For stomach 30 tablet 2  . potassium chloride (K-DUR) 10 MEQ tablet Take 1 tablet (10 mEq total) by mouth every other day. 30 tablet 2  . venlafaxine XR (EFFEXOR-XR) 75 MG 24 hr capsule TAKE 3 CAPSULES EVERY DAY 90 capsule 1  . VIIBRYD 40 MG TABS TAKE 1 TABLET (40 MG TOTAL) BY MOUTH DAILY. 30 tablet 1  . tiZANidine (ZANAFLEX) 4 MG tablet One by mouth every 8 hours as needed for spasm 90 tablet 3  . traZODone (DESYREL) 100 MG tablet Take 1 tablet by mouth at bedtime.     No current facility-administered medications for  this visit.     Physical Exam  Blood pressure 147/87, pulse 83, temperature 97.9 F (36.6 C), resp. rate 16, height 5\' 6"  (1.676 m), weight 273 lb (123.832 kg), last menstrual period 08/09/2002.  Constitutional: overall normal hygiene, normal nutrition, well developed, normal grooming, normal body habitus. Assistive device:in wheelchair.  Too painful to stand.  Musculoskeletal: gait and  station Limp would be left if she stood but she cannot, muscle tone and strength are normal, no tremors or atrophy is present.  .  Neurological: coordination overall normal.  Deep tendon reflex/nerve stretch intact.  Sensation normal.  Cranial nerves II-XII intact.   Skin:   Right lower leg has scars, otherwise overall no scars, lesions, ulcers or rashes. No psoriasis.  Psychiatric: Alert and oriented x 3.  Recent memory intact, remote memory unclear.  Normal mood and affect. Well groomed.  Good eye contact.  Cardiovascular: overall no swelling, no varicosities, no edema bilaterally, normal temperatures of the legs and arms, no clubbing, cyanosis and good capillary refill.  Lymphatic: palpation is normal.  The left lower extremity is examined:  Inspection:  Thigh:  Non-tender and no defects  Knee has swelling 2+ effusion.                        Joint tenderness is present                        Patient is tender over the medial joint line  Lower Leg:  Has normal appearance and no tenderness or defects  Ankle:  Non-tender and no defects  Foot:  Non-tender and no defects Range of Motion:  Knee:  Range of motion is: 0-95                        Crepitus is  present  Ankle:  Range of motion is normal. Strength and Tone:  The left lower extremity has normal strength and tone. Stability:  Knee:  The knee is stable.  Ankle:  The ankle is stable.  Spine/Pelvis examination:  Inspection:  Overall, sacoiliac joint benign and hips nontender; without crepitus or defects.   Thoracic spine inspection:  Alignment normal without kyphosis present   Lumbar spine inspection:  Alignment  with normal lumbar lordosis, without scoliosis apparent.   Thoracic spine palpation:  without tenderness of spinal processes   Lumbar spine palpation: with tenderness of lumbar area; with tightness of lumbar muscles    Range of Motion:   Lumbar flexion, forward flexion is 20  with pain or tenderness    Lumbar extension is 5  with pain or tenderness   Left lateral bend is Normal  without pain or tenderness   Right lateral bend is Normal without pain or tenderness   Straight leg raising is Normal   Strength & tone: Normal   Stability overall normal stability    The patient has been educated about the nature of the problem(s) and counseled on treatment options.  The patient appeared to understand what I have discussed and is in agreement with it.  Encounter Diagnoses  Name Primary?  . Left shoulder pain Yes  . Left knee pain   . Essential hypertension   . Obesity   . Left-sided low back pain with left-sided sciatica     PLAN Call if any problems.  Precautions discussed.  Continue current medications.   Return to clinic 1 month   PROCEDURE NOTE:  The patient request injection, verbal consent was obtained.  The left shoulder was prepped appropriately after time out was performed.   Sterile technique was observed and injection of 1 cc of Depo-Medrol 40 mg with several cc's of plain xylocaine. Anesthesia was provided by ethyl chloride and a 20-gauge needle was used to inject the shoulder area. A posterior approach  was used.  The injection was tolerated well.  A band aid dressing was applied.  The patient was advised to apply ice later today and tomorrow to the injection sight as needed.  Make sure records sent to Dr. Duffy Rh[facility] at [facility].  Get x-rays of the lumbar spine at the hospital.  Make sure I get report so I can review films.  X-rays of the lumbar spine is as below:  CLINICAL DATA:  Left-sided lower back pain and left-sided sciatica for many years without known injury.  EXAM: LUMBAR SPINE - COMPLETE 4+ VIEW  COMPARISON: CT scan of July 04, 2015.  FINDINGS: No fracture or significant spondylolisthesis is noted. Severe degenerative disc disease is noted at L2-3, L4-5 and L5-S1 with anterior osteophyte formation. Posterior facet joints are unremarkable.  IMPRESSION: Severe multilevel degenerative disc disease. No acute abnormality seen in the lumbar spine.  She will need to see the neurosurgeon in [place] again.

## 2015-09-24 ENCOUNTER — Encounter: Payer: Self-pay | Admitting: Gastroenterology

## 2015-09-24 ENCOUNTER — Telehealth: Payer: Self-pay | Admitting: Family Medicine

## 2015-09-24 NOTE — Telephone Encounter (Signed)
Patient called stating that she was going to only take two effexors at night

## 2015-09-24 NOTE — Telephone Encounter (Signed)
Okay to take only to Effexor. I have learned about patient that she is going to do what she is going to do regardless of what we advise

## 2015-09-28 ENCOUNTER — Other Ambulatory Visit: Payer: Self-pay | Admitting: Family Medicine

## 2015-09-28 NOTE — Telephone Encounter (Signed)
Last seen and filled 09/01/15. Call in at [facility]

## 2015-10-07 DIAGNOSIS — Z8781 Personal history of (healed) traumatic fracture: Secondary | ICD-10-CM | POA: Diagnosis not present

## 2015-10-07 DIAGNOSIS — S72411D Displaced unspecified condyle fracture of lower end of right femur, subsequent encounter for closed fracture with routine healing: Secondary | ICD-10-CM | POA: Diagnosis not present

## 2015-10-07 DIAGNOSIS — Z743 Need for continuous supervision: Secondary | ICD-10-CM | POA: Diagnosis not present

## 2015-10-07 DIAGNOSIS — M1712 Unilateral primary osteoarthritis, left knee: Secondary | ICD-10-CM | POA: Diagnosis not present

## 2015-10-07 DIAGNOSIS — M25562 Pain in left knee: Secondary | ICD-10-CM | POA: Diagnosis not present

## 2015-10-07 DIAGNOSIS — M1711 Unilateral primary osteoarthritis, right knee: Secondary | ICD-10-CM | POA: Diagnosis not present

## 2015-10-07 DIAGNOSIS — Z967 Presence of other bone and tendon implants: Secondary | ICD-10-CM | POA: Diagnosis not present

## 2015-10-07 DIAGNOSIS — R279 Unspecified lack of coordination: Secondary | ICD-10-CM | POA: Diagnosis not present

## 2015-10-08 ENCOUNTER — Telehealth: Payer: Self-pay | Admitting: Family Medicine

## 2015-10-08 ENCOUNTER — Emergency Department (HOSPITAL_COMMUNITY)
Admission: EM | Admit: 2015-10-08 | Discharge: 2015-10-09 | Disposition: A | Discharge status: Other Acute Inpatient Hospital | Payer: Medicare Other | Attending: Emergency Medicine | Admitting: Emergency Medicine

## 2015-10-08 ENCOUNTER — Encounter (HOSPITAL_COMMUNITY): Payer: Self-pay | Admitting: Emergency Medicine

## 2015-10-08 DIAGNOSIS — Z79891 Long term (current) use of opiate analgesic: Secondary | ICD-10-CM | POA: Diagnosis not present

## 2015-10-08 DIAGNOSIS — Z79899 Other long term (current) drug therapy: Secondary | ICD-10-CM | POA: Diagnosis not present

## 2015-10-08 DIAGNOSIS — M1990 Unspecified osteoarthritis, unspecified site: Secondary | ICD-10-CM | POA: Diagnosis not present

## 2015-10-08 DIAGNOSIS — R45851 Suicidal ideations: Secondary | ICD-10-CM | POA: Insufficient documentation

## 2015-10-08 DIAGNOSIS — Z7982 Long term (current) use of aspirin: Secondary | ICD-10-CM | POA: Insufficient documentation

## 2015-10-08 DIAGNOSIS — F329 Major depressive disorder, single episode, unspecified: Secondary | ICD-10-CM | POA: Diagnosis not present

## 2015-10-08 DIAGNOSIS — Z046 Encounter for general psychiatric examination, requested by authority: Secondary | ICD-10-CM | POA: Diagnosis present

## 2015-10-08 DIAGNOSIS — J45909 Unspecified asthma, uncomplicated: Secondary | ICD-10-CM | POA: Insufficient documentation

## 2015-10-08 LAB — CBC WITH DIFFERENTIAL/PLATELET
BASOS PCT: 0 %
Basophils Absolute: 0 10*3/uL (ref 0.0–0.1)
EOS ABS: 0 10*3/uL (ref 0.0–0.7)
EOS PCT: 0 %
HCT: 39.3 % (ref 36.0–46.0)
HEMOGLOBIN: 13.3 g/dL (ref 12.0–15.0)
Lymphocytes Relative: 22 %
Lymphs Abs: 1.6 10*3/uL (ref 0.7–4.0)
MCH: 29.2 pg (ref 26.0–34.0)
MCHC: 33.8 g/dL (ref 30.0–36.0)
MCV: 86.2 fL (ref 78.0–100.0)
Monocytes Absolute: 0.7 10*3/uL (ref 0.1–1.0)
Monocytes Relative: 10 %
NEUTROS PCT: 68 %
Neutro Abs: 4.9 10*3/uL (ref 1.7–7.7)
PLATELETS: 221 10*3/uL (ref 150–400)
RBC: 4.56 MIL/uL (ref 3.87–5.11)
RDW: 14.2 % (ref 11.5–15.5)
WBC: 7.2 10*3/uL (ref 4.0–10.5)

## 2015-10-08 LAB — COMPREHENSIVE METABOLIC PANEL
ALK PHOS: 62 U/L (ref 38–126)
ALT: 19 U/L (ref 14–54)
ANION GAP: 8 (ref 5–15)
AST: 16 U/L (ref 15–41)
Albumin: 3.7 g/dL (ref 3.5–5.0)
BILIRUBIN TOTAL: 0.4 mg/dL (ref 0.3–1.2)
BUN: 14 mg/dL (ref 6–20)
CALCIUM: 9 mg/dL (ref 8.9–10.3)
CO2: 26 mmol/L (ref 22–32)
CREATININE: 0.77 mg/dL (ref 0.44–1.00)
Chloride: 97 mmol/L — ABNORMAL LOW (ref 101–111)
Glucose, Bld: 103 mg/dL — ABNORMAL HIGH (ref 65–99)
Potassium: 3.7 mmol/L (ref 3.5–5.1)
Sodium: 131 mmol/L — ABNORMAL LOW (ref 135–145)
TOTAL PROTEIN: 6.8 g/dL (ref 6.5–8.1)

## 2015-10-08 LAB — SALICYLATE LEVEL

## 2015-10-08 LAB — RAPID URINE DRUG SCREEN, HOSP PERFORMED
AMPHETAMINES: NOT DETECTED
Barbiturates: NOT DETECTED
Benzodiazepines: POSITIVE — AB
COCAINE: NOT DETECTED
OPIATES: NOT DETECTED
TETRAHYDROCANNABINOL: NOT DETECTED

## 2015-10-08 LAB — ACETAMINOPHEN LEVEL: Acetaminophen (Tylenol), Serum: <10 ug/mL — ABNORMAL LOW (ref 10–30)

## 2015-10-08 LAB — ETHANOL: Alcohol, Ethyl (B): <5 mg/dL (ref <5)

## 2015-10-08 MED ORDER — FUROSEMIDE 40 MG PO TABS
20.0000 mg | ORAL_TABLET | ORAL | Status: DC
Start: 2015-10-08 — End: 2015-10-09

## 2015-10-08 MED ORDER — ATORVASTATIN CALCIUM 40 MG PO TABS: 40.0000 mg | ORAL_TABLET | Freq: Every day | ORAL | Status: DC

## 2015-10-08 MED ORDER — VENLAFAXINE HCL ER 37.5 MG PO CP24: 75.0000 mg | ORAL_CAPSULE | Freq: Every day | ORAL | Status: DC

## 2015-10-08 MED ORDER — PANTOPRAZOLE SODIUM 40 MG PO TBEC
40.0000 mg | DELAYED_RELEASE_TABLET | Freq: Every day | ORAL | Status: DC
  Administered 2015-10-09: 40 mg via ORAL
  Filled 2015-10-08: qty 1

## 2015-10-08 MED ORDER — AMLODIPINE BESY-BENAZEPRIL HCL 5-20 MG PO CAPS: 1.0000 | ORAL_CAPSULE | Freq: Every day | ORAL | Status: DC

## 2015-10-08 MED ORDER — ASPIRIN EC 81 MG PO TBEC
81.0000 mg | DELAYED_RELEASE_TABLET | Freq: Every morning | ORAL | Status: DC
  Administered 2015-10-09: 81 mg via ORAL
  Filled 2015-10-08: qty 1

## 2015-10-08 MED ORDER — CLONAZEPAM 0.5 MG PO TABS
1.0000 mg | ORAL_TABLET | Freq: Three times a day (TID) | ORAL | Status: DC | PRN
  Administered 2015-10-08 – 2015-10-09 (×2): 1 mg via ORAL
  Administered 2015-10-09: 0.5 mg via ORAL
  Filled 2015-10-08 (×3): qty 2

## 2015-10-08 NOTE — ED Notes (Signed)
Pt in bed.  Unhappy about being unable to get muscle relaxer and effexor tonight.  Informed pt her Effexor is ordered to be given with breakfast.

## 2015-10-08 NOTE — ED Provider Notes (Signed)
CSN: [id number]     Arrival date & time 10/08/15  1448 History   First MD Initiated Contact with Patient 10/08/15 1524     Chief Complaint  Patient presents with  . V70.1     (Consider location/radiation/quality/duration/timing/severity/associated sxs/prior Treatment) HPI Comments: Patient complains of worsening depression with thoughts of self-harm. She has no plan. She denies homicidal ideation hallucinations. States she has history of depression and takes Effexor which is not helping. She's been [facility] last year for similar symptoms. She also takes Klonopin 3 times daily for anxiety. Denies any illicit drug or alcohol use. Endorses feeling tearful, not went to eat or drink and upper despite any activities. She denies any pain. She endorses occasional palpitations and "nervous stomach". No chest pain or abdominal pain currently.  The history is provided by the patient.    Past Medical History  Diagnosis Date  . Anxiety   . H/O: knee surgery   . Hypertension   . Allergy   . Depression   . Asthma   . Osteoarthritis   . Palpitations   . Osteopenia   . Hemorrhoid   . Panic attack   . Hypercholesteremia   . GERD (gastroesophageal reflux disease)    Past Surgical History  Procedure Laterality Date  . Eye surgery    . Knee surgery    . Esophagogastroduodenoscopy N/A 07/02/2014    Procedure: ESOPHAGOGASTRODUODENOSCOPY (EGD);  Surgeon: Iva Booparl E Gessner, MD;  [facility];  Service: Endoscopy;  Laterality: N/A;  with possible dilation or botox  . Balloon dilation N/A 07/02/2014    Procedure: BALLOON DILATION;  Surgeon: Iva Booparl E Gessner, MD;  Location: [facility];  Service: Endoscopy;  Laterality: N/A;  . Botox injection N/A 07/02/2014    Procedure: BOTOX INJECTION;  Surgeon: Iva Booparl E Gessner, MD;  Location: [facility];  Service: Endoscopy;  Laterality: N/A;   Family History  Problem Relation Age of Onset  . Osteoarthritis Mother   . Hypertension Mother    . Hypertension Father   . Hyperlipidemia Father   . Heart disease Father   . Osteoarthritis Father   . Lung cancer Maternal Uncle     smoker  . Colon polyps Neg Hx   . Colon cancer Neg Hx    Social History  Substance Use Topics  . Smoking status: Never Smoker   . Smokeless tobacco: Never Used  . Alcohol Use: No   OB History    No data available     Review of Systems  Constitutional: Positive for activity change, appetite change and fatigue. Negative for fever.  HENT: Negative for congestion and rhinorrhea.   Respiratory: Negative for cough, chest tightness and shortness of breath.   Cardiovascular: Positive for palpitations. Negative for chest pain.  Gastrointestinal: Negative for nausea, vomiting and abdominal pain.  Genitourinary: Negative for dysuria and hematuria.  Musculoskeletal: Negative for myalgias, arthralgias and neck pain.  Neurological: Negative for dizziness and weakness.  Psychiatric/Behavioral: Positive for suicidal ideas, behavioral problems, sleep disturbance, dysphoric mood and decreased concentration. The patient is nervous/anxious.   A complete 10 system review of systems was obtained and all systems are negative except as noted in the HPI and PMH.      Allergies  Wellbutrin  Home Medications   Prior to Admission medications   Medication Sig Start Date End Date Taking? Authorizing Provider  amLODipine-benazepril (LOTREL) 5-20 MG capsule Take 1 capsule by mouth daily.   Yes Historical Provider, MD  aspirin EC 81 MG tablet  Take 81 mg by mouth every morning.    Yes Historical Provider, MD  atorvastatin (LIPITOR) 40 MG tablet TAKE 1 TABLET (40 MG TOTAL) BY MOUTH DAILY. 09/15/15  Yes Frederica Kuster, MD  Cholecalciferol (VITAMIN D) 2000 UNITS CAPS Take 1 capsule by mouth daily.   Yes Historical Provider, MD  clonazePAM (KLONOPIN) 1 MG tablet TAKE 1 TABLET BY MOUTH 3 TIMES A DAY AS NEEDED 09/28/15  Yes Frederica Kuster, MD  docusate sodium (COLACE) 100 MG  capsule Take 200 mg by mouth daily.    Yes Historical Provider, MD  esomeprazole (NEXIUM) 40 MG capsule Take 1 capsule (40 mg total) by mouth daily. 09/16/15  Yes Frederica Kuster, MD  furosemide (LASIX) 20 MG tablet Take 1 tablet (20 mg total) by mouth every other day. 07/02/14  Yes Iva Boop, MD  HYDROcodone-acetaminophen (NORCO) 7.5-325 MG per tablet Take 1 tablet by mouth every 4 (four) hours as needed for moderate pain.    Yes Historical Provider, MD  Linaclotide Karlene Einstein) 145 MCG CAPS capsule 1 PO 30 mins prior to your first meal Patient taking differently: Take 290 mcg by mouth daily before breakfast.  08/05/15  Yes West Bali, MD  meloxicam (MOBIC) 15 MG tablet TAKE 1 TABLET EVERY DAY 09/16/15  Yes Junie Spencer, FNP  montelukast (SINGULAIR) 10 MG tablet TAKE 1 TABLET (10 MG TOTAL) BY MOUTH AT BEDTIME. 04/20/15  Yes Frederica Kuster, MD  potassium chloride (K-DUR) 10 MEQ tablet Take 1 tablet (10 mEq total) by mouth every other day. 07/30/15 07/29/16 Yes Frederica Kuster, MD  tiZANidine (ZANAFLEX) 4 MG tablet One by mouth every 8 hours as needed for spasm Patient taking differently: Take 4 mg by mouth every 8 (eight) hours as needed for muscle spasms. One by mouth every 8 hours as needed for spasm 09/22/15  Yes Darreld Mclean, MD  venlafaxine XR (EFFEXOR-XR) 75 MG 24 hr capsule TAKE 3 CAPSULES EVERY DAY Patient taking differently: TAKE 2 CAPSULES EVERY DAY AT BEDTIME 09/17/15  Yes Frederica Kuster, MD  amLODipine-benazepril (LOTREL) 5-10 MG capsule TAKE 1 CAPSULE EVERY DAY Patient not taking: Reported on 10/08/2015 08/17/15   Frederica Kuster, MD  pantoprazole (PROTONIX) 40 MG tablet Take 1 tablet (40 mg total) by mouth daily. For stomach Patient not taking: Reported on 10/08/2015 07/30/15   Frederica Kuster, MD  VIIBRYD 40 MG TABS TAKE 1 TABLET (40 MG TOTAL) BY MOUTH DAILY. Patient not taking: Reported on 10/08/2015 06/03/15   Frederica Kuster, MD   BP 145/76 mmHg  Pulse 73  Temp(Src) 97.4 F  (36.3 C) (Tympanic)  Resp 18  Ht  (1.676 m)  Wt 273 lb (123.832 kg)  BMI 44.08 kg/m2  SpO2 100%  LMP 08/09/2002 Physical Exam  Constitutional: She is oriented to person, place, and time. She appears well-developed and well-nourished. No distress.  Flat affect. tearful  HENT:  Head: Normocephalic and atraumatic.  Mouth/Throat: Oropharynx is clear and moist. No oropharyngeal exudate.  Eyes: Conjunctivae and EOM are normal. Pupils are equal, round, and reactive to light.  Neck: Normal range of motion. Neck supple.  No meningismus.  Cardiovascular: Normal rate, regular rhythm, normal heart sounds and intact distal pulses.   No murmur heard. Pulmonary/Chest: Effort normal and breath sounds normal. No respiratory distress.  Abdominal: Soft. There is no tenderness. There is no rebound and no guarding.  Musculoskeletal: Normal range of motion. She exhibits no edema or tenderness.  Neurological: She  is alert and oriented to person, place, and time. No cranial nerve deficit. She exhibits normal muscle tone. Coordination normal.  No ataxia on finger to nose bilaterally. No pronator drift. 5/5 strength throughout. CN 2-12 intact.Equal grip strength. Sensation intact.   Skin: Skin is warm.  Psychiatric: She has a normal mood and affect. Her behavior is normal.  Nursing note and vitals reviewed.   ED Course  Procedures (including critical care time) Labs Review Labs Reviewed  COMPREHENSIVE METABOLIC PANEL - Abnormal; Notable for the following:    Sodium 131 (*)    Chloride 97 (*)    Glucose, Bld 103 (*)    All other components within normal limits  URINE RAPID DRUG SCREEN, HOSP PERFORMED - Abnormal; Notable for the following:    Benzodiazepines POSITIVE (*)    All other components within normal limits  ACETAMINOPHEN LEVEL - Abnormal; Notable for the following:    Acetaminophen (Tylenol), Serum <10 (*)    All other components within normal limits  ETHANOL  CBC WITH  DIFFERENTIAL/PLATELET  SALICYLATE LEVEL    Imaging Review No results found. I have personally reviewed and evaluated these images and lab results as part of my medical decision-making.   EKG Interpretation   Date/Time:  Thursday Oct 08 2015 16:37:30 EDT Ventricular Rate:  71 PR Interval:  147 QRS Duration: 104 QT Interval:  403 QTC Calculation: 438 R Axis:   -3 Text Interpretation:  Sinus rhythm Abnormal R-wave progression, early  transition No significant change was found Confirmed by Manus Gunning  MD,  Mikalah Skyles [phone]) on 10/08/2015 4:47:31 PM      MDM   Final diagnoses:  None   Depression with Thoughts of self-harm. Vitals are stable. No distress.  Screening labs obtained.  Labs are at baseline. Drug screen with benzodiazepines which she has prescribed.  Patient is medically clear for psychiatric evaluation. Holding orders placed.  Patient does meet inpatient criteria per TTS and placement will be sought.    Glynn Octave, MD 10/08/15 [phone]

## 2015-10-08 NOTE — Telephone Encounter (Signed)
Patient called stating that medication is making her depressed.  Informed patient to discontinue medication.  Appt. Made to see Dr. Hyacinth MeekerMiller 05/30 at 1100.

## 2015-10-08 NOTE — ED Notes (Signed)
Patient given medicines by Nurse, states that they did not give her all her medicines that the Dr prescribes for her at home. Reassured patient that she was given all medications that are currently ordered for her here in the ED. RN made aware of patient's concern.

## 2015-10-08 NOTE — BHH Counselor (Signed)
This Clinical research associatewriter faxed supporting information to the following psych facilities for patient placement: [facility]   [place]

## 2015-10-08 NOTE — ED Notes (Signed)
Pt states she has been very depressed and having thoughts of harming self.  Pt crying at triage.
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
## 2015-10-08 NOTE — ED Notes (Signed)
Pt repeatedly asking why she cannot have other medications.  Explained to her that we do not have any other orders for medications and that I cannot give any medications without Dr. Jarrett Ablesrders

## 2015-10-09 MED ORDER — MELOXICAM 7.5 MG PO TABS
15.0000 mg | ORAL_TABLET | Freq: Every day | ORAL | Status: DC
  Administered 2015-10-09: 15 mg via ORAL
  Filled 2015-10-09: qty 1
  Filled 2015-10-09 (×3): qty 2

## 2015-10-09 MED ORDER — ATORVASTATIN CALCIUM 40 MG PO TABS
40.0000 mg | ORAL_TABLET | Freq: Every day | ORAL | Status: DC
  Filled 2015-10-09: qty 1

## 2015-10-09 MED ORDER — AMLODIPINE BESYLATE 5 MG PO TABS
5.0000 mg | ORAL_TABLET | Freq: Every day | ORAL | Status: DC
  Administered 2015-10-09: 5 mg via ORAL
  Filled 2015-10-09: qty 1

## 2015-10-09 MED ORDER — VENLAFAXINE HCL ER 37.5 MG PO CP24
75.0000 mg | ORAL_CAPSULE | Freq: Every day | ORAL | Status: DC
  Administered 2015-10-09: 75 mg via ORAL
  Filled 2015-10-09: qty 2

## 2015-10-09 MED ORDER — MONTELUKAST SODIUM 10 MG PO TABS: 10.0000 mg | ORAL_TABLET | Freq: Every day | ORAL | Status: DC

## 2015-10-09 MED ORDER — ATORVASTATIN CALCIUM 40 MG PO TABS
40.0000 mg | ORAL_TABLET | Freq: Every day | ORAL | Status: DC
  Administered 2015-10-09: 40 mg via ORAL
  Filled 2015-10-09 (×4): qty 1

## 2015-10-09 MED ORDER — BENAZEPRIL HCL 10 MG PO TABS
20.0000 mg | ORAL_TABLET | Freq: Every day | ORAL | Status: DC
  Administered 2015-10-09: 20 mg via ORAL
  Filled 2015-10-09 (×4): qty 2

## 2015-10-09 MED ORDER — ATORVASTATIN CALCIUM 40 MG PO TABS: 40.0000 mg | ORAL_TABLET | Freq: Every day | ORAL | Status: DC

## 2015-10-09 NOTE — BH Assessment (Signed)
[facility]      Per [facility], a bed is now available (1356 -- 10/09/15).  Please call report to [phone].

## 2015-10-09 NOTE — BH Assessment (Signed)
Per Jasmine DecemberSharon, RN at [facility] the patient is able to come to their hospital.  Per Jasmine DecemberSharon report should be called when transportation is ready to take the patient to their hospital.  The number to give report is [phone].  The accepting doctor is (Dr. Guss Bundehalla).  The patient will be going to Bed 533.    Writer informed the ER MD (Dr. Adriana Simasook)

## 2015-10-09 NOTE — ED Notes (Signed)
Patient assisted to restroom via wheelchair

## 2015-10-09 NOTE — BH Assessment (Signed)
Per Joni ReiningNicole at [facility] the patient has been accepted.  The accepting physician is Dr. Guss Bundehalla. Per Joni ReiningNicole, she will call back with an time that the patient can be sent to their facility.   Writer informed the NP, Tanika.

## 2015-10-13 ENCOUNTER — Ambulatory Visit: Payer: Medicare Other | Admitting: Family Medicine

## 2015-10-20 ENCOUNTER — Ambulatory Visit: Payer: Self-pay | Admitting: Orthopaedic Surgery

## 2015-10-26 DIAGNOSIS — R002 Palpitations: Secondary | ICD-10-CM | POA: Diagnosis not present

## 2015-10-26 DIAGNOSIS — I493 Ventricular premature depolarization: Secondary | ICD-10-CM | POA: Diagnosis not present

## 2015-10-26 DIAGNOSIS — R011 Cardiac murmur, unspecified: Secondary | ICD-10-CM | POA: Diagnosis not present

## 2015-10-27 DIAGNOSIS — I493 Ventricular premature depolarization: Secondary | ICD-10-CM | POA: Diagnosis not present

## 2015-10-27 DIAGNOSIS — R011 Cardiac murmur, unspecified: Secondary | ICD-10-CM | POA: Diagnosis not present

## 2015-10-27 DIAGNOSIS — R002 Palpitations: Secondary | ICD-10-CM | POA: Diagnosis not present

## 2015-10-28 DIAGNOSIS — I493 Ventricular premature depolarization: Secondary | ICD-10-CM | POA: Diagnosis not present

## 2015-10-28 DIAGNOSIS — R002 Palpitations: Secondary | ICD-10-CM | POA: Diagnosis not present

## 2015-10-28 DIAGNOSIS — R011 Cardiac murmur, unspecified: Secondary | ICD-10-CM | POA: Diagnosis not present

## 2015-10-29 DIAGNOSIS — R011 Cardiac murmur, unspecified: Secondary | ICD-10-CM | POA: Diagnosis not present

## 2015-10-29 DIAGNOSIS — I493 Ventricular premature depolarization: Secondary | ICD-10-CM | POA: Diagnosis not present

## 2015-10-29 DIAGNOSIS — R002 Palpitations: Secondary | ICD-10-CM | POA: Diagnosis not present

## 2015-11-11 ENCOUNTER — Other Ambulatory Visit: Payer: Self-pay | Admitting: *Deleted

## 2015-11-11 MED ORDER — AMLODIPINE BESY-BENAZEPRIL HCL 5-10 MG PO CAPS: 1.0000 | ORAL_CAPSULE | Freq: Every day | ORAL | Status: DC

## 2015-11-22 ENCOUNTER — Other Ambulatory Visit: Payer: Self-pay | Admitting: Family Medicine

## 2015-12-01 ENCOUNTER — Observation Stay (HOSPITAL_COMMUNITY)
Admission: EM | Admit: 2015-12-01 | Discharge: 2015-12-04 | Disposition: A | Discharge status: Skilled Nursing Facility | Payer: Medicare Other | Attending: Internal Medicine | Admitting: Internal Medicine

## 2015-12-01 ENCOUNTER — Encounter (HOSPITAL_COMMUNITY): Payer: Self-pay

## 2015-12-01 DIAGNOSIS — N390 Urinary tract infection, site not specified: Secondary | ICD-10-CM | POA: Diagnosis not present

## 2015-12-01 DIAGNOSIS — Z659 Problem related to unspecified psychosocial circumstances: Secondary | ICD-10-CM

## 2015-12-01 DIAGNOSIS — F32A Depression, unspecified: Secondary | ICD-10-CM | POA: Diagnosis present

## 2015-12-01 DIAGNOSIS — B358 Other dermatophytoses: Secondary | ICD-10-CM | POA: Insufficient documentation

## 2015-12-01 DIAGNOSIS — F410 Panic disorder [episodic paroxysmal anxiety] without agoraphobia: Secondary | ICD-10-CM | POA: Diagnosis not present

## 2015-12-01 DIAGNOSIS — Z6838 Body mass index (BMI) 38.0-38.9, adult: Secondary | ICD-10-CM | POA: Insufficient documentation

## 2015-12-01 DIAGNOSIS — I10 Essential (primary) hypertension: Secondary | ICD-10-CM | POA: Diagnosis present

## 2015-12-01 DIAGNOSIS — Z888 Allergy status to other drugs, medicaments and biological substances status: Secondary | ICD-10-CM | POA: Diagnosis not present

## 2015-12-01 DIAGNOSIS — M8580 Other specified disorders of bone density and structure, unspecified site: Secondary | ICD-10-CM | POA: Diagnosis not present

## 2015-12-01 DIAGNOSIS — F419 Anxiety disorder, unspecified: Secondary | ICD-10-CM | POA: Diagnosis present

## 2015-12-01 DIAGNOSIS — E860 Dehydration: Secondary | ICD-10-CM | POA: Diagnosis not present

## 2015-12-01 DIAGNOSIS — R5381 Other malaise: Secondary | ICD-10-CM | POA: Diagnosis present

## 2015-12-01 DIAGNOSIS — F411 Generalized anxiety disorder: Secondary | ICD-10-CM | POA: Diagnosis not present

## 2015-12-01 DIAGNOSIS — E669 Obesity, unspecified: Secondary | ICD-10-CM | POA: Diagnosis not present

## 2015-12-01 DIAGNOSIS — Z7982 Long term (current) use of aspirin: Secondary | ICD-10-CM | POA: Insufficient documentation

## 2015-12-01 DIAGNOSIS — J45909 Unspecified asthma, uncomplicated: Secondary | ICD-10-CM | POA: Insufficient documentation

## 2015-12-01 DIAGNOSIS — K219 Gastro-esophageal reflux disease without esophagitis: Secondary | ICD-10-CM | POA: Diagnosis not present

## 2015-12-01 DIAGNOSIS — Z993 Dependence on wheelchair: Secondary | ICD-10-CM

## 2015-12-01 DIAGNOSIS — E7800 Pure hypercholesterolemia, unspecified: Secondary | ICD-10-CM | POA: Insufficient documentation

## 2015-12-01 DIAGNOSIS — R404 Transient alteration of awareness: Secondary | ICD-10-CM | POA: Diagnosis not present

## 2015-12-01 DIAGNOSIS — M62838 Other muscle spasm: Secondary | ICD-10-CM | POA: Insufficient documentation

## 2015-12-01 DIAGNOSIS — M1712 Unilateral primary osteoarthritis, left knee: Secondary | ICD-10-CM | POA: Insufficient documentation

## 2015-12-01 DIAGNOSIS — Z91410 Personal history of adult physical and sexual abuse: Secondary | ICD-10-CM | POA: Diagnosis not present

## 2015-12-01 DIAGNOSIS — F329 Major depressive disorder, single episode, unspecified: Secondary | ICD-10-CM | POA: Insufficient documentation

## 2015-12-01 DIAGNOSIS — B372 Candidiasis of skin and nail: Secondary | ICD-10-CM | POA: Diagnosis present

## 2015-12-01 DIAGNOSIS — Z609 Problem related to social environment, unspecified: Secondary | ICD-10-CM

## 2015-12-01 DIAGNOSIS — R531 Weakness: Secondary | ICD-10-CM | POA: Diagnosis not present

## 2015-12-01 LAB — URINALYSIS, ROUTINE W REFLEX MICROSCOPIC
Bilirubin Urine: NEGATIVE
Glucose, UA: NEGATIVE mg/dL
NITRITE: POSITIVE — AB
PH: 6 (ref 5.0–8.0)
PROTEIN: NEGATIVE mg/dL
Specific Gravity, Urine: <1.005 — ABNORMAL LOW (ref 1.005–1.030)

## 2015-12-01 LAB — COMPREHENSIVE METABOLIC PANEL
ALBUMIN: 3.8 g/dL (ref 3.5–5.0)
ALK PHOS: 55 U/L (ref 38–126)
ALT: 22 U/L (ref 14–54)
ANION GAP: 9 (ref 5–15)
AST: 22 U/L (ref 15–41)
BUN: 10 mg/dL (ref 6–20)
CALCIUM: 9 mg/dL (ref 8.9–10.3)
CO2: 25 mmol/L (ref 22–32)
Chloride: 98 mmol/L — ABNORMAL LOW (ref 101–111)
Creatinine, Ser: 0.85 mg/dL (ref 0.44–1.00)
GFR calc non Af Amer: >60 mL/min (ref >60)
GLUCOSE: 99 mg/dL (ref 65–99)
POTASSIUM: 4.1 mmol/L (ref 3.5–5.1)
SODIUM: 132 mmol/L — AB (ref 135–145)
TOTAL PROTEIN: 6.8 g/dL (ref 6.5–8.1)
Total Bilirubin: 0.8 mg/dL (ref 0.3–1.2)

## 2015-12-01 LAB — RAPID URINE DRUG SCREEN, HOSP PERFORMED
AMPHETAMINES: NOT DETECTED
BARBITURATES: NOT DETECTED
Benzodiazepines: POSITIVE — AB
Cocaine: NOT DETECTED
Opiates: NOT DETECTED
TETRAHYDROCANNABINOL: NOT DETECTED

## 2015-12-01 LAB — CBC WITH DIFFERENTIAL/PLATELET
BASOS ABS: 0 10*3/uL (ref 0.0–0.1)
BASOS PCT: 0 %
EOS ABS: 0 10*3/uL (ref 0.0–0.7)
EOS PCT: 0 %
HCT: 39.3 % (ref 36.0–46.0)
Hemoglobin: 13.4 g/dL (ref 12.0–15.0)
Lymphocytes Relative: 23 %
Lymphs Abs: 1.4 10*3/uL (ref 0.7–4.0)
MCH: 29.1 pg (ref 26.0–34.0)
MCHC: 34.1 g/dL (ref 30.0–36.0)
MCV: 85.2 fL (ref 78.0–100.0)
MONO ABS: 0.7 10*3/uL (ref 0.1–1.0)
Monocytes Relative: 12 %
Neutro Abs: 3.9 10*3/uL (ref 1.7–7.7)
Neutrophils Relative %: 65 %
PLATELETS: 170 10*3/uL (ref 150–400)
RBC: 4.61 MIL/uL (ref 3.87–5.11)
RDW: 14.1 % (ref 11.5–15.5)
WBC: 6.1 10*3/uL (ref 4.0–10.5)

## 2015-12-01 LAB — URINE MICROSCOPIC-ADD ON

## 2015-12-01 LAB — ETHANOL: Alcohol, Ethyl (B): <5 mg/dL (ref <5)

## 2015-12-01 MED ORDER — MELOXICAM 7.5 MG PO TABS
15.0000 mg | ORAL_TABLET | Freq: Every day | ORAL | Status: DC
  Administered 2015-12-02 – 2015-12-04 (×3): 15 mg via ORAL
  Filled 2015-12-01 (×3): qty 2
  Filled 2015-12-01: qty 1
  Filled 2015-12-01 (×2): qty 2

## 2015-12-01 MED ORDER — BENAZEPRIL HCL 10 MG PO TABS
10.0000 mg | ORAL_TABLET | Freq: Every day | ORAL | Status: DC
  Administered 2015-12-02 – 2015-12-04 (×3): 10 mg via ORAL
  Filled 2015-12-01 (×3): qty 1

## 2015-12-01 MED ORDER — AMLODIPINE BESY-BENAZEPRIL HCL 5-10 MG PO CAPS: 1.0000 | ORAL_CAPSULE | Freq: Every day | ORAL | Status: DC

## 2015-12-01 MED ORDER — ENOXAPARIN SODIUM 40 MG/0.4ML ~~LOC~~ SOLN
40.0000 mg | SUBCUTANEOUS | Status: DC
  Administered 2015-12-02 – 2015-12-04 (×3): 40 mg via SUBCUTANEOUS
  Filled 2015-12-01 (×3): qty 0.4

## 2015-12-01 MED ORDER — HYDROCODONE-ACETAMINOPHEN 7.5-325 MG PO TABS
1.0000 | ORAL_TABLET | ORAL | Status: DC | PRN
  Administered 2015-12-02 – 2015-12-03 (×4): 1 via ORAL
  Filled 2015-12-01 (×4): qty 1

## 2015-12-01 MED ORDER — ATORVASTATIN CALCIUM 40 MG PO TABS
40.0000 mg | ORAL_TABLET | Freq: Every day | ORAL | Status: DC
  Administered 2015-12-02 – 2015-12-04 (×3): 40 mg via ORAL
  Filled 2015-12-01 (×3): qty 1

## 2015-12-01 MED ORDER — AMLODIPINE BESYLATE 5 MG PO TABS
5.0000 mg | ORAL_TABLET | Freq: Every day | ORAL | Status: DC
  Administered 2015-12-02 – 2015-12-04 (×3): 5 mg via ORAL
  Filled 2015-12-01 (×3): qty 1

## 2015-12-01 MED ORDER — ACETAMINOPHEN 325 MG PO TABS: 650.0000 mg | ORAL_TABLET | Freq: Four times a day (QID) | ORAL | Status: DC | PRN

## 2015-12-01 MED ORDER — CYCLOBENZAPRINE HCL 10 MG PO TABS
5.0000 mg | ORAL_TABLET | Freq: Three times a day (TID) | ORAL | Status: DC | PRN
  Administered 2015-12-01 – 2015-12-03 (×3): 5 mg via ORAL
  Filled 2015-12-01 (×3): qty 1

## 2015-12-01 MED ORDER — DEXTROSE 5 % IV SOLN
1.0000 g | Freq: Once | INTRAVENOUS | Status: AC
  Administered 2015-12-01: 1 g via INTRAVENOUS
  Filled 2015-12-01: qty 10

## 2015-12-01 MED ORDER — MONTELUKAST SODIUM 10 MG PO TABS
10.0000 mg | ORAL_TABLET | Freq: Every day | ORAL | Status: DC
  Administered 2015-12-01 – 2015-12-03 (×3): 10 mg via ORAL
  Filled 2015-12-01 (×3): qty 1

## 2015-12-01 MED ORDER — FLEET ENEMA 7-19 GM/118ML RE ENEM: 1.0000 | ENEMA | Freq: Once | RECTAL | Status: DC | PRN

## 2015-12-01 MED ORDER — ASPIRIN EC 81 MG PO TBEC
81.0000 mg | DELAYED_RELEASE_TABLET | Freq: Every morning | ORAL | Status: DC
  Administered 2015-12-02 – 2015-12-04 (×3): 81 mg via ORAL
  Filled 2015-12-01 (×3): qty 1

## 2015-12-01 MED ORDER — BISACODYL 10 MG RE SUPP: 10.0000 mg | Freq: Every day | RECTAL | Status: DC | PRN

## 2015-12-01 MED ORDER — PANTOPRAZOLE SODIUM 40 MG PO TBEC
40.0000 mg | DELAYED_RELEASE_TABLET | Freq: Every day | ORAL | Status: DC
  Administered 2015-12-02 – 2015-12-04 (×3): 40 mg via ORAL
  Filled 2015-12-01 (×3): qty 1

## 2015-12-01 MED ORDER — SODIUM CHLORIDE 0.9 % IV SOLN
INTRAVENOUS | Status: DC
  Administered 2015-12-01 – 2015-12-03 (×3): via INTRAVENOUS

## 2015-12-01 MED ORDER — NYSTATIN 100000 UNIT/GM EX POWD
Freq: Two times a day (BID) | CUTANEOUS | Status: DC
  Administered 2015-12-01 – 2015-12-04 (×6): via TOPICAL
  Filled 2015-12-01 (×3): qty 15

## 2015-12-01 MED ORDER — DIAZEPAM 5 MG PO TABS
5.0000 mg | ORAL_TABLET | Freq: Two times a day (BID) | ORAL | Status: DC
  Administered 2015-12-01 – 2015-12-04 (×6): 5 mg via ORAL
  Filled 2015-12-01 (×6): qty 1

## 2015-12-01 MED ORDER — DOCUSATE SODIUM 100 MG PO CAPS
200.0000 mg | ORAL_CAPSULE | Freq: Every day | ORAL | Status: DC
  Administered 2015-12-02 – 2015-12-04 (×3): 200 mg via ORAL
  Filled 2015-12-01 (×3): qty 2

## 2015-12-01 MED ORDER — ACETAMINOPHEN 650 MG RE SUPP: 650.0000 mg | Freq: Four times a day (QID) | RECTAL | Status: DC | PRN

## 2015-12-01 MED ORDER — DEXTROSE 5 % IV SOLN
2.0000 g | INTRAVENOUS | Status: DC
  Administered 2015-12-02 – 2015-12-04 (×3): 2 g via INTRAVENOUS
  Filled 2015-12-01 (×6): qty 2

## 2015-12-01 NOTE — ED Notes (Signed)
MD at bedside. 

## 2015-12-01 NOTE — ED Notes (Addendum)
Patient brought in by [facility] for generalized weakness x1 month. States she started Zoloft 1 month ago and has then felt weak. States she has been feeling depressed and increased anxiety. Denies SI or HI.
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
## 2015-12-01 NOTE — ED Provider Notes (Signed)
CSN: [id number]     Arrival date & time 12/01/15  1331 History   By signing my name below, I, Doreatha Martin, attest that this documentation has been prepared under the direction and in the presence of Bethann Berkshire, MD. Electronically Signed: Doreatha Martin, ED Scribe. 12/01/2015. 1:47 PM.     Chief Complaint  Patient presents with  . Fatigue   Patient is a 65 y.o. female presenting with weakness. The history is provided by the patient. No language interpreter was used.  Weakness This is a new problem. The current episode started more than 1 week ago. The problem occurs constantly. The problem has not changed since onset.Pertinent negatives include no chest pain, no abdominal pain and no headaches. Nothing aggravates the symptoms. Nothing relieves the symptoms. Treatments tried: Zoloft. The treatment provided no relief.    HPI Comments: Lauren Harmon is a 65 y.o. female brought in by ambulance who presents to the Emergency Department complaining of moderate generalized weakness for one month with associated depression. She reports that she feels as though she has no energy. Pt states she was started on Zoloft a month ago while staying at [facility] and reports this medication has provided no relief of her symptoms. No additional worsening or alleviating factors noted.  She denies current SI today. Pt has h/o depression and anxiety. She is not currently followed by psychiatry. PCP is Dr. Hyacinth Meeker.   Past Medical History  Diagnosis Date  . Anxiety   . H/O: knee surgery   . Hypertension   . Allergy   . Depression   . Asthma   . Osteoarthritis   . Palpitations   . Osteopenia   . Hemorrhoid   . Panic attack   . Hypercholesteremia   . GERD (gastroesophageal reflux disease)    Past Surgical History  Procedure Laterality Date  . Eye surgery    . Knee surgery    . Esophagogastroduodenoscopy N/A 07/02/2014    Procedure: ESOPHAGOGASTRODUODENOSCOPY (EGD);  Surgeon: Iva Boop,  MD;  Location: [facility];  Service: Endoscopy;  Laterality: N/A;  with possible dilation or botox  . Balloon dilation N/A 07/02/2014    Procedure: BALLOON DILATION;  Surgeon: Iva Boop, MD;  Location: [facility];  Service: Endoscopy;  Laterality: N/A;  . Botox injection N/A 07/02/2014    Procedure: BOTOX INJECTION;  Surgeon: Iva Boop, MD;  Location: [facility];  Service: Endoscopy;  Laterality: N/A;   Family History  Problem Relation Age of Onset  . Osteoarthritis Mother   . Hypertension Mother   . Hypertension Father   . Hyperlipidemia Father   . Heart disease Father   . Osteoarthritis Father   . Lung cancer Maternal Uncle     smoker  . Colon polyps Neg Hx   . Colon cancer Neg Hx    Social History  Substance Use Topics  . Smoking status: Never Smoker   . Smokeless tobacco: Never Used  . Alcohol Use: No   OB History    No data available     Review of Systems  Constitutional: Negative for appetite change and fatigue.  HENT: Negative for congestion, ear discharge and sinus pressure.   Eyes: Negative for discharge.  Respiratory: Negative for cough.   Cardiovascular: Negative for chest pain.  Gastrointestinal: Negative for abdominal pain and diarrhea.  Genitourinary: Negative for frequency and hematuria.  Musculoskeletal: Negative for back pain.  Skin: Negative for rash.  Neurological: Positive for weakness (generalized). Negative for seizures  and headaches.  Psychiatric/Behavioral: Negative for suicidal ideas and hallucinations.       +depression   Allergies  Wellbutrin  Home Medications   Prior to Admission medications   Medication Sig Start Date End Date Taking? Authorizing Provider  amLODipine-benazepril (LOTREL) 5-10 MG capsule Take 1 capsule by mouth daily. 11/11/15   Frederica Kuster, MD  aspirin EC 81 MG tablet Take 81 mg by mouth every morning.     Historical Provider, MD  atorvastatin (LIPITOR) 40 MG tablet TAKE 1 TABLET (40 MG TOTAL) BY MOUTH  DAILY. 09/15/15   Frederica Kuster, MD  Cholecalciferol (VITAMIN D) 2000 UNITS CAPS Take 1 capsule by mouth daily.    Historical Provider, MD  clonazePAM (KLONOPIN) 1 MG tablet TAKE 1 TABLET BY MOUTH 3 TIMES A DAY AS NEEDED 09/28/15   Frederica Kuster, MD  docusate sodium (COLACE) 100 MG capsule Take 200 mg by mouth daily.     Historical Provider, MD  esomeprazole (NEXIUM) 40 MG capsule Take 1 capsule (40 mg total) by mouth daily. 09/16/15   Frederica Kuster, MD  furosemide (LASIX) 20 MG tablet Take 1 tablet (20 mg total) by mouth every other day. 07/02/14   Iva Boop, MD  HYDROcodone-acetaminophen (NORCO) 7.5-325 MG per tablet Take 1 tablet by mouth every 4 (four) hours as needed for moderate pain.     Historical Provider, MD  Linaclotide Karlene Einstein) 145 MCG CAPS capsule 1 PO 30 mins prior to your first meal Patient taking differently: Take 290 mcg by mouth daily before breakfast.  08/05/15   West Bali, MD  meloxicam (MOBIC) 15 MG tablet TAKE 1 TABLET EVERY DAY 09/16/15   Junie Spencer, FNP  montelukast (SINGULAIR) 10 MG tablet TAKE 1 TABLET (10 MG TOTAL) BY MOUTH AT BEDTIME. 11/23/15   Frederica Kuster, MD  pantoprazole (PROTONIX) 40 MG tablet Take 1 tablet (40 mg total) by mouth daily. For stomach Patient not taking: Reported on 10/08/2015 07/30/15   Frederica Kuster, MD  potassium chloride (K-DUR) 10 MEQ tablet Take 1 tablet (10 mEq total) by mouth every other day. 07/30/15 07/29/16  Frederica Kuster, MD  tiZANidine (ZANAFLEX) 4 MG tablet One by mouth every 8 hours as needed for spasm Patient taking differently: Take 4 mg by mouth every 8 (eight) hours as needed for muscle spasms. One by mouth every 8 hours as needed for spasm 09/22/15   Darreld Mclean, MD  venlafaxine XR (EFFEXOR-XR) 75 MG 24 hr capsule TAKE 3 CAPSULES EVERY DAY Patient taking differently: TAKE 2 CAPSULES EVERY DAY AT BEDTIME 09/17/15   Frederica Kuster, MD  VIIBRYD 40 MG TABS TAKE 1 TABLET (40 MG TOTAL) BY MOUTH DAILY. Patient not  taking: Reported on 10/08/2015 06/03/15   Frederica Kuster, MD   Ht 5\' 6"  (1.676 m)  Wt 260 lb (117.935 kg)  BMI 41.99 kg/m2  LMP 08/09/2002 Physical Exam  Constitutional: She is oriented to person, place, and time. She appears well-developed.  HENT:  Head: Normocephalic.  Eyes: Conjunctivae and EOM are normal. No scleral icterus.  Neck: Neck supple. No thyromegaly present.  Cardiovascular: Normal rate, regular rhythm and normal heart sounds.  Exam reveals no gallop and no friction rub.   No murmur heard. Pulmonary/Chest: Effort normal and breath sounds normal. No stridor. She has no wheezes. She has no rales. She exhibits no tenderness.  Abdominal: Soft. She exhibits no distension. There is no tenderness. There is no rebound.  Musculoskeletal: Normal  range of motion. She exhibits no edema.  Lymphadenopathy:    She has no cervical adenopathy.  Neurological: She is oriented to person, place, and time. She exhibits normal muscle tone. Coordination normal.  Skin: No rash noted. No erythema.  Psychiatric: Her behavior is normal.  Pt exhibits general weakness and depression, but does not appear to be suicidal.   Nursing note and vitals reviewed.   ED Course  Procedures (including critical care time) DIAGNOSTIC STUDIES: Oxygen Saturation is 98% on RA, normal by my interpretation.    COORDINATION OF CARE: 1:45 PM Discussed treatment plan with pt at bedside which includes lab work, TTS consult and pt agreed to plan.   Labs Review Labs Reviewed - No data to display  Imaging Review No results found. I have personally reviewed and evaluated these images and lab results as part of my medical decision-making.   EKG Interpretation None      MDM   Final diagnoses:  None    Patient with urinary tract infection and fatigue. She will be admitted for IV antibiotics  Bethann BerkshireJoseph Killian Ress, MD 12/01/15 [id number]

## 2015-12-01 NOTE — ED Notes (Signed)
Patient reports of nurse that she wants to go to nursing home, that she doesn't have enough help at home.
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
## 2015-12-02 DIAGNOSIS — I10 Essential (primary) hypertension: Secondary | ICD-10-CM | POA: Diagnosis not present

## 2015-12-02 DIAGNOSIS — F329 Major depressive disorder, single episode, unspecified: Secondary | ICD-10-CM | POA: Diagnosis not present

## 2015-12-02 DIAGNOSIS — F419 Anxiety disorder, unspecified: Secondary | ICD-10-CM | POA: Diagnosis not present

## 2015-12-02 DIAGNOSIS — N390 Urinary tract infection, site not specified: Secondary | ICD-10-CM | POA: Diagnosis not present

## 2015-12-02 DIAGNOSIS — R5381 Other malaise: Secondary | ICD-10-CM | POA: Diagnosis not present

## 2015-12-02 DIAGNOSIS — Z609 Problem related to social environment, unspecified: Secondary | ICD-10-CM | POA: Diagnosis not present

## 2015-12-02 DIAGNOSIS — B372 Candidiasis of skin and nail: Secondary | ICD-10-CM | POA: Diagnosis not present

## 2015-12-02 MED ORDER — SERTRALINE HCL 50 MG PO TABS
100.0000 mg | ORAL_TABLET | Freq: Every day | ORAL | Status: DC
  Administered 2015-12-03: 100 mg via ORAL
  Filled 2015-12-02 (×2): qty 2

## 2015-12-02 NOTE — Care Management CC44 (Signed)
Condition Code 44 Documentation Completed  Patient Details  Name: Lauren Harmon MRN: [id number] Date of Birth: 07/10/1950   Condition Code 44 given:    yes Patient signature on Condition Code 44 notice: yes     Documentation of 2 MD's agreement:    yes  Code 44 added to claim:   yes    Amiyah Shryock, Chrystine OilerSharley Diane, RN 12/02/2015, 2:21 PM

## 2015-12-02 NOTE — Clinical Social Work Placement (Signed)
   CLINICAL SOCIAL WORK PLACEMENT  NOTE  Date:  12/02/2015  Patient Details  Name: Lauren Harmon MRN: [id number] Date of Birth: 01/07/1951  Clinical Social Work is seeking post-discharge placement for this patient at the Skilled  Nursing Facility level of care (*CSW will initial, date and re-position this form in  chart as items are completed):  Yes   Patient/family provided with [facility] Clinical Social Work Department's list of facilities offering this level of care within the geographic area requested by the patient (or if unable, by the patient's family).  Yes   Patient/family informed of their freedom to choose among providers that offer the needed level of care, that participate in Medicare, Medicaid or managed care program needed by the patient, have an available bed and are willing to accept the patient.  Yes   Patient/family informed of [facility]'s ownership interest in [facility] and [facility], as well as of the fact that they are under no obligation to receive care at these facilities.  PASRR submitted to EDS on       PASRR number received on       Existing PASRR number confirmed on 12/02/15     FL2 transmitted to all facilities in geographic area requested by pt/family on 12/02/15     FL2 transmitted to all facilities within larger geographic area on       Patient informed that his/her managed care company has contracts with or will negotiate with certain facilities, including the following:            Patient/family informed of bed offers received.  Patient chooses bed at       Physician recommends and patient chooses bed at      Patient to be transferred to   on  .  Patient to be transferred to facility by       Patient family notified on   of transfer.  Name of family member notified:        PHYSICIAN       Additional Comment:    _______________________________________________ Annice NeedySettle, Kyndle Schlender D, LCSW 12/02/2015, 2:54 PM

## 2015-12-02 NOTE — [place] FL2 (Signed)
[place] MEDICAID FL2 LEVEL OF CARE SCREENING TOOL     IDENTIFICATION  Patient Name: Lauren Harmon Birthdate: Sep 15, 1950 Sex: female Admission Date (Current Location): 12/01/2015  [place] and [place] Number:  Haynes Bast [id number] R Facility and Address:  [facility],  [address] [place] [address]      Provider Number: [id number]  Attending Physician Name and Address:  Jerald Kief, MD  Relative Name and Phone Number:       Current Level of Care: Hospital Recommended Level of Care: Skilled Nursing Facility Prior Approval Number:    Date Approved/Denied:   PASRR Number:  [id number])  Discharge Plan: SNF    Current Diagnoses: Patient Active Problem List   Diagnosis Date Noted  . UTI (lower urinary tract infection) 12/01/2015  . Debility 12/01/2015  . 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 Intertriginous candidiasis, under breasts 12/01/2015  . Poor social situation 12/01/2015  . Constipation 08/05/2015  . Anxiety   . GAD (generalized anxiety disorder) 02/11/2013  . Hypertension 09/21/2010  . Hyperlipidemia 09/21/2010  . Osteoarthritis 09/21/2010  . Depression 09/21/2010  . Asthma 09/21/2010  . Obesity 09/21/2010  . Osteopenia 09/21/2010  . Hemorrhoids 09/21/2010  . Lumbar disc disease 09/21/2010    Orientation RESPIRATION BLADDER Height & Weight     Self, Time, Situation, Place  Normal Incontinent Weight: 230 lb 9.6 oz (104.599 kg) Height:   (167.6 cm)  BEHAVIORAL SYMPTOMS/MOOD NEUROLOGICAL BOWEL NUTRITION STATUS        Diet (Regular)  AMBULATORY STATUS COMMUNICATION OF NEEDS Skin   Extensive Assist Verbally Normal                       Personal Care Assistance Level of Assistance  Bathing, Feeding, Dressing Bathing Assistance: Maximum assistance Feeding assistance: Independent Dressing Assistance: Maximum assistance     Functional Limitations Info  Sight, Hearing, Speech Sight Info: Adequate Hearing Info:  Adequate Speech Info: Adequate    SPECIAL CARE FACTORS FREQUENCY  OT (By licensed OT)     PT Frequency: 5x/week OT Frequency: 3x/week            Contractures      Additional Factors Info  Code Status, Allergies, Psychotropic Code Status Info: Full code Allergies Info: Wellbutrin Psychotropic Info: Xanax, Effexor XR, Klonopin, Valium         Current Medications (12/02/2015):  This is the current hospital active medication list Current Facility-Administered Medications  Medication Dose Route Frequency Provider Last Rate Last Dose  . 0.9 %  sodium chloride infusion   Intravenous Continuous Delano Metz, MD 75 mL/hr at 12/02/15 1108    . acetaminophen (TYLENOL) tablet 650 mg  650 mg Oral Q6H PRN Delano Metz, MD       Or  . acetaminophen (TYLENOL) suppository 650 mg  650 mg Rectal Q6H PRN Delano Metz, MD      . amLODipine (NORVASC) tablet 5 mg  5 mg Oral Daily Delano Metz, MD   5 mg at 12/02/15 1058  . aspirin EC tablet 81 mg  81 mg Oral q morning - 10a Delano Metz, MD   81 mg at 12/02/15 1058  . atorvastatin (LIPITOR) tablet 40 mg  40 mg Oral q1800 Delano Metz, MD      . benazepril (LOTENSIN) tablet 10 mg  10 mg Oral Daily Delano Metz, MD   10 mg at 12/02/15 1058  . bisacodyl (DULCOLAX) suppository 10 mg  10 mg Rectal Daily PRN Delano Metz, MD      .  cefTRIAXone (ROCEPHIN) 2 g in dextrose 5 % 50 mL IVPB  2 g Intravenous Q24H Delano Metzobert Schertz, MD   2 g at 12/02/15 1059  . cyclobenzaprine (FLEXERIL) tablet 5 mg  5 mg Oral TID PRN Delano Metzobert Schertz, MD   5 mg at 12/01/15 2318  . diazepam (VALIUM) tablet 5 mg  5 mg Oral BID Delano Metzobert Schertz, MD   5 mg at 12/02/15 1058  . docusate sodium (COLACE) capsule 200 mg  200 mg Oral Daily Delano Metzobert Schertz, MD   200 mg at 12/02/15 1058  . enoxaparin (LOVENOX) injection 40 mg  40 mg Subcutaneous Q24H Delano Metzobert Schertz, MD   40 mg at 12/02/15 1058  . HYDROcodone-acetaminophen (NORCO) 7.5-325 MG per tablet 1 tablet  1 tablet Oral Q4H  PRN Delano Metzobert Schertz, MD   1 tablet at 12/02/15 1112  . meloxicam (MOBIC) tablet 15 mg  15 mg Oral Daily Jerald KiefStephen K Chiu, MD   15 mg at 12/02/15 1059  . montelukast (SINGULAIR) tablet 10 mg  10 mg Oral QHS Delano Metzobert Schertz, MD   10 mg at 12/01/15 2258  . nystatin (MYCOSTATIN/NYSTOP) topical powder   Topical BID Delano Metzobert Schertz, MD      . pantoprazole (PROTONIX) EC tablet 40 mg  40 mg Oral Daily Delano Metzobert Schertz, MD   40 mg at 12/02/15 1058  . sertraline (ZOLOFT) tablet 100 mg  100 mg Oral Daily Jerald KiefStephen K Chiu, MD   100 mg at 12/02/15 1245  . sodium phosphate (FLEET) 7-19 GM/118ML enema 1 enema  1 enema Rectal Once PRN Delano Metzobert Schertz, MD         Discharge Medications: Please see discharge summary for a list of discharge medications.  Relevant Imaging Results:  Relevant Lab Results:   Additional Information SSN [id number]   Lateya Dauria, Juleen ChinaHeather D, LCSW

## 2015-12-02 NOTE — Evaluation (Signed)
Occupational Therapy Evaluation Patient Details Name: Lauren Harmon MRN: [id number] DOB: 01/15/1951 Today's Date: 12/02/2015    History of Present Illness Lauren Harmon is a 65 y.o. female with history of severe and chronic anxiety/ depression, obestiy, DJD w R TKR, panic attacks, HTN, allergies, HL and GERD. Patient presents to ED c/o sig fatigue and listlessness, thinks it may be related to Zoloft and/or Valium that were started 1-2 mos ago. Unable to care for herself and her son is not able to care for her effectively either due to her size and immobility. In ED w/u showed UTI. Asked to see patient for UTI and poor home situation.   Clinical Impression   Pt awake, alert, oriented x4 this am, agreeable to OT evaluation. Pt reports she is confined to the bedroom at home due to her house (mobile home) being inaccessible by wheelchair and pt being unable to ambulate due to weakness and pain in left knee. Pt only transfers from bed to bedside commode and back. Pt requires total assist for bathing in shower due to weakness, therefore only sponge bathes as she does not have adequate assistance for bathing tasks. Pt BUE range of motion is <50% due to osteoarthritis and strength is 3-/5 throughout BUE. Pt requests to be placed in a "nursing home." Pt will benefit from placement at SNF on discharge to work towards increasing pt's strength and promote greater independence and safety during functional tasks including ADLs and functional mobility, as well as decrease caregiver burden. Will continue to see while in acute care to work towards greater independence and safety during functional tasks.    Follow Up Recommendations  SNF    Equipment Recommendations  None recommended by OT       Precautions / Restrictions Precautions Precautions: Fall Restrictions Weight Bearing Restrictions: No      Mobility Bed Mobility Overal bed mobility: Needs Assistance Bed Mobility: Rolling;Supine to  Sit;Sit to Supine Rolling: Supervision   Supine to sit: Min assist;Mod assist;HOB elevated Sit to supine: Min assist;HOB elevated   General bed mobility comments: Pt requires min assist to bring legs off bed, increased time for maneuvering trunk around to a sitting position. Min/Mod assist for bringing hips around and sitting straight at EOB  Transfers                 General transfer comment: Not tested. Pt adamant that she cannot stand         ADL Overall ADL's : Needs assistance/impaired Eating/Feeding: Set up;Bed level Eating/Feeding Details (indicate cue type and reason): Pt requires assistance for opening packages and utensils Grooming: Set up;Bed level Grooming Details (indicate cue type and reason): Pt unable to perform functional mobility to sink, performs ADL tasks in the bed with set-up                               General ADL Comments: Pt reports she requires set-up to max assist for all ADL tasks due to weakness and pain in her LLE limiting mobility. She sponges bathes due to son being physically unable to lift and hold her in tub, no other caregiver support.      Vision Vision Assessment?: No apparent visual deficits          Pertinent Vitals/Pain Pain Assessment: No/denies pain     Hand Dominance Right   Extremity/Trunk Assessment Upper Extremity Assessment Upper Extremity Assessment: Generalized weakness (BUE ROM <50%)  Lower Extremity Assessment Lower Extremity Assessment: Defer to PT evaluation       Communication Communication Communication: No difficulties   Cognition Arousal/Alertness: Awake/alert Behavior During Therapy: Flat affect Overall Cognitive Status: History of cognitive impairments - at baseline                                Home Living Family/patient expects to be discharged to:: Skilled nursing facility Living Arrangements: Children (son)                               Additional  Comments: Pt lives in mobile home, son stays with her however is not there all the time      Prior Functioning/Environment Level of Independence: Needs assistance  Gait / Transfers Assistance Needed: Pt reports inability to stand or ambulate with walker. States she uses a wheelchair although wheelchair will not fit through house so she stays in her bedroom ADL's / Homemaking Assistance Needed: Pt requires assistance for all ADL tasks due to weakness and limited mobility.         OT Diagnosis: Generalized weakness;Cognitive deficits   OT Problem List: Decreased strength;Decreased range of motion;Decreased activity tolerance;Impaired balance (sitting and/or standing);Decreased cognition;Decreased safety awareness;Impaired UE functional use;Obesity   OT Treatment/Interventions: Self-care/ADL training;Therapeutic exercise;Energy conservation;DME and/or AE instruction;Therapeutic activities;Cognitive remediation/compensation;Patient/family education    OT Goals(Current goals can be found in the care plan section) Acute Rehab OT Goals Patient Stated Goal: To go to a nursing home where I have more help OT Goal Formulation: With patient Time For Goal Achievement: 12/16/15 Potential to Achieve Goals: Good  OT Frequency: Min 2X/week   Barriers to D/C: Inaccessible home environment;Decreased caregiver support  Pt unable to access any room besides the bedroom due to small spaces and wheelchair not fitting through house. Son assists when he can, is not home all the time and is physically unable to provide necessary level of assistance per pt report.           End of Session    Activity Tolerance: Patient limited by fatigue Patient left: in bed;with call bell/phone within reach;with bed alarm set   Time: [id number] OT Time Calculation (min): 31 min Charges:  OT General Charges $OT Visit: 1 Procedure OT Evaluation $OT Eval Moderate Complexity: 1 Procedure  Ezra SitesLeslie Kahlen Morais, OTR/L   [phone]  12/02/2015, 9:21 AM

## 2015-12-02 NOTE — Care Management CC44 (Signed)
Condition Code 44 Documentation Completed  Patient Details  Name: Lauren Harmon MRN: [id number] Date of Birth: 03/15/1951   Condition Code 44 given:    yes Patient signature on Condition Code 44 notice: yes    Documentation of 2 MD's agreement:    yes Code 44 added to claim:   yes    Trevyn Lumpkin, Chrystine OilerSharley Diane, RN 12/02/2015, 3:18 PM

## 2015-12-02 NOTE — NC FL2 (Deleted)
[place] MEDICAID FL2 LEVEL OF CARE SCREENING TOOL     IDENTIFICATION  Patient Name: Lauren Harmon Birthdate: 08/26/1950 Sex: female Admission Date (Current Location): 12/01/2015  [place] [id number] Facility and Address:  [facility],  [address], [place] [id number]      Provider Number: [id number]  Attending Physician Name and Address:  Jerald KiefStephen K Chiu, MD  Relative Name and Phone Number:       Current Level of Care: Hospital Recommended Level of Care: Skilled Nursing Facility Prior Approval Number:    Date Approved/Denied:   PASRR Number:  [id number] [id number])  Discharge Plan: SNF    Current Diagnoses: Patient Active Problem List   Diagnosis Date Noted  . UTI (lower urinary tract infection) 12/01/2015  . Debility 12/01/2015  . 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 Intertriginous candidiasis, under breasts 12/01/2015  . Poor social situation 12/01/2015  . Constipation 08/05/2015  . Anxiety   . GAD (generalized anxiety disorder) 02/11/2013  . Hypertension 09/21/2010  . Hyperlipidemia 09/21/2010  . Osteoarthritis 09/21/2010  . Depression 09/21/2010  . Asthma 09/21/2010  . Obesity 09/21/2010  . Osteopenia 09/21/2010  . Hemorrhoids 09/21/2010  . Lumbar disc disease 09/21/2010    Orientation RESPIRATION BLADDER Height & Weight     Self, Time, Situation, Place  Normal Incontinent Weight: 230 lb 9.6 oz (104.599 kg) Height:  5\' 6"  (167.6 cm)  BEHAVIORAL SYMPTOMS/MOOD NEUROLOGICAL BOWEL NUTRITION STATUS        Diet (Regular)  AMBULATORY STATUS COMMUNICATION OF NEEDS Skin   Extensive Assist Verbally Normal                       Personal Care Assistance Level of Assistance  Bathing, Feeding, Dressing Bathing Assistance: Maximum assistance Feeding assistance: Independent Dressing Assistance: Maximum assistance     Functional Limitations Info  Sight, Hearing, Speech Sight Info: Adequate Hearing Info:  Adequate Speech Info: Adequate    SPECIAL CARE FACTORS FREQUENCY  PT (By licensed PT)     PT Frequency: 5x/week              Contractures      Additional Factors Info  Code Status, Allergies, Psychotropic Code Status Info: Full code Allergies Info: Wellbutrin Psychotropic Info: Xanax, Effexor XR, Klonopin, Valium         Current Medications (12/02/2015):  This is the current hospital active medication list Current Facility-Administered Medications  Medication Dose Route Frequency Provider Last Rate Last Dose  . 0.9 %  sodium chloride infusion   Intravenous Continuous Delano Metzobert Schertz, MD 75 mL/hr at 12/02/15 1108    . acetaminophen (TYLENOL) tablet 650 mg  650 mg Oral Q6H PRN Delano Metzobert Schertz, MD       Or  . acetaminophen (TYLENOL) suppository 650 mg  650 mg Rectal Q6H PRN Delano Metzobert Schertz, MD      . amLODipine (NORVASC) tablet 5 mg  5 mg Oral Daily Delano Metzobert Schertz, MD   5 mg at 12/02/15 1058  . aspirin EC tablet 81 mg  81 mg Oral q morning - 10a Delano Metzobert Schertz, MD   81 mg at 12/02/15 1058  . atorvastatin (LIPITOR) tablet 40 mg  40 mg Oral q1800 Delano Metzobert Schertz, MD      . benazepril (LOTENSIN) tablet 10 mg  10 mg Oral Daily Delano Metzobert Schertz, MD   10 mg at 12/02/15 1058  . bisacodyl (DULCOLAX) suppository 10 mg  10 mg Rectal Daily PRN Delano Metzobert Schertz, MD      .  cefTRIAXone (ROCEPHIN) 2 g in dextrose 5 % 50 mL IVPB  2 g Intravenous Q24H Delano Metz, MD   2 g at 12/02/15 1059  . cyclobenzaprine (FLEXERIL) tablet 5 mg  5 mg Oral TID PRN Delano Metz, MD   5 mg at 12/01/15 2318  . diazepam (VALIUM) tablet 5 mg  5 mg Oral BID Delano Metz, MD   5 mg at 12/02/15 1058  . docusate sodium (COLACE) capsule 200 mg  200 mg Oral Daily Delano Metz, MD   200 mg at 12/02/15 1058  . enoxaparin (LOVENOX) injection 40 mg  40 mg Subcutaneous Q24H Delano Metz, MD   40 mg at 12/02/15 1058  . HYDROcodone-acetaminophen (NORCO) 7.5-325 MG per tablet 1 tablet  1 tablet Oral Q4H PRN Delano Metz,  MD   1 tablet at 12/02/15 1112  . meloxicam (MOBIC) tablet 15 mg  15 mg Oral Daily Jerald Kief, MD   15 mg at 12/02/15 1059  . montelukast (SINGULAIR) tablet 10 mg  10 mg Oral QHS Delano Metz, MD   10 mg at 12/01/15 2258  . nystatin (MYCOSTATIN/NYSTOP) topical powder   Topical BID Delano Metz, MD      . pantoprazole (PROTONIX) EC tablet 40 mg  40 mg Oral Daily Delano Metz, MD   40 mg at 12/02/15 1058  . sertraline (ZOLOFT) tablet 100 mg  100 mg Oral Daily Jerald Kief, MD   100 mg at 12/02/15 1245  . sodium phosphate (FLEET) 7-19 GM/118ML enema 1 enema  1 enema Rectal Once PRN Delano Metz, MD         Discharge Medications: Please see discharge summary for a list of discharge medications.  Relevant Imaging Results:  Relevant Lab Results:   Additional Information SSN [id number], Juleen China, LCSW

## 2015-12-02 NOTE — Progress Notes (Signed)
PROGRESS NOTE    Lauren Harmon  ZOX:[id number] DOB: 1950-10-15 DOA: 12/01/2015 PCP: Frederica Kuster, MD  Brief Narrative:  65 y.o. female with history of severe and chronic anxiety/ depression, obestiy, DJD w R TKR, panic attacks, HTN, allergies, HL and GERD. Patient presents to ED c/o sig fatigue and listlessness, thinks it may be related to Zoloft and/or Valium that were started 1-2 mos ago. Unable to care for herself and her son is not able to care for her effectively either due to her size and immobility. In ED w/u showed UTI. Asked to see patient for UTI and poor home situation.   Assessment & Plan:   Principal Problem:   UTI (lower urinary tract infection) Active Problems:   Hypertension   Depression   Obesity   Anxiety   Debility   Wheelchair dependence   Intertriginous candidiasis, under breasts   Poor social situation  1. UTI -cont on Rocephin, IVF. Afebrile, no leukocytosis 2. Severe debility/ poor home situation - will likely need facility placement, consult PT/OT/social work 3. Fatigue - not sure zoloft or valium may be problematic. Patient wants to stop zoloft and also will reduce Valium to 5 mg bid (from 7.5 tid). Have consulted psychiatry for assistance with medication changes. 4. HTN - cont Lotrel, hold lasix 5. Vol - patient presented mildly dehydrated 6. Tinea rash - under breasts, Nystatin cream ordered  7. Depression - as per #3. Not taking trazodone/ Klonopin/ Effexor anymore. Per above, have consulted psychiatry for assistance with medication changes. 8. Muscle spasm - change to flexeril (dc zanaflex, pt request) 9. DJD sp R TKR, severe L knee DJD    DVT prophylaxis: Lovenox subcutaneous Code Status: Full Family Communication: Patient in room, family not at bedside Disposition Plan: Uncertain at this time   Consultants:  Psychiatry   Procedures:     Antimicrobials:      Subjective: No complaints this  morning  Objective: Filed Vitals:   12/01/15 1930 12/01/15 2017 12/02/15 0639 12/02/15 1335  BP: 147/78 132/99 128/86 138/82  Pulse: 65 63 57 66  Temp:  97.8 F (36.6 C) 98.4 F (36.9 C) 98.3 F (36.8 C)  TempSrc:  Oral Oral Oral  Resp:  Height:   (1.676 m)    Weight:  104.599 kg (230 lb 9.6 oz)    SpO2: 98% 100% 95% 96%    Intake/Output Summary (Last 24 hours) at 12/02/15 1709 Last data filed at 12/02/15 1300  Gross per 24 hour  Intake    480 ml  Output      0 ml  Net    480 ml   Filed Weights   12/01/15 1334 12/01/15 2017  Weight: 117.935 kg (260 lb) 104.599 kg (230 lb 9.6 oz)    Examination:  General exam: Appears calm and comfortable  Respiratory system: Clear to auscultation. Respiratory effort normal. Cardiovascular system: S1 & S2 heard, RRR.  Gastrointestinal system: Abdomen is nondistended, soft and nontender. No organomegaly or masses felt. Normal bowel sounds heard. Central nervous system: Alert and oriented. No focal neurological deficits. Extremities: Symmetric 5 x 5 power. Skin: No rashes, lesions Psychiatry: Judgement and insight appear normal. Mood & affect appropriate.     Data Reviewed: I have personally reviewed following labs and imaging studies  CBC:  Recent Labs Lab 12/01/15 1402  WBC 6.1  NEUTROABS 3.9  HGB 13.4  HCT 39.3  MCV 85.2  PLT 170   Basic Metabolic Panel:  Recent Labs Lab 12/01/15 1402  NA 132*  K 4.1  CL 98*  CO2 25  GLUCOSE 99  BUN 10  CREATININE 0.85  CALCIUM 9.0   GFR: Estimated Creatinine Clearance: 81.7 mL/min (by C-G formula based on Cr of 0.85). Liver Function Tests:  Recent Labs Lab 12/01/15 1402  AST 22  ALT 22  ALKPHOS 55  BILITOT 0.8  PROT 6.8  ALBUMIN 3.8   No results for input(s): LIPASE, AMYLASE in the last 168 hours. No results for input(s): AMMONIA in the last 168 hours. Coagulation Profile: No results for input(s): INR, PROTIME in the last 168 hours. Cardiac  Enzymes: No results for input(s): CKTOTAL, CKMB, CKMBINDEX, TROPONINI in the last 168 hours. BNP (last 3 results) No results for input(s): PROBNP in the last 8760 hours. HbA1C: No results for input(s): HGBA1C in the last 72 hours. CBG: No results for input(s): GLUCAP in the last 168 hours. Lipid Profile: No results for input(s): CHOL, HDL, LDLCALC, TRIG, CHOLHDL, LDLDIRECT in the last 72 hours. Thyroid Function Tests: No results for input(s): TSH, T4TOTAL, FREET4, T3FREE, THYROIDAB in the last 72 hours. Anemia Panel: No results for input(s): VITAMINB12, FOLATE, FERRITIN, TIBC, IRON, RETICCTPCT in the last 72 hours. Sepsis Labs: No results for input(s): PROCALCITON, LATICACIDVEN in the last 168 hours.  No results found for this or any previous visit (from the past 240 hour(s)).       Radiology Studies: No results found.      Scheduled Meds: . amLODipine  5 mg Oral Daily  . aspirin EC  81 mg Oral q morning - 10a  . atorvastatin  40 mg Oral q1800  . benazepril  10 mg Oral Daily  . cefTRIAXone (ROCEPHIN)  IV  2 g Intravenous Q24H  . diazepam  5 mg Oral BID  . docusate sodium  200 mg Oral Daily  . enoxaparin (LOVENOX) injection  40 mg Subcutaneous Q24H  . meloxicam  15 mg Oral Daily  . montelukast  10 mg Oral QHS  . nystatin   Topical BID  . pantoprazole  40 mg Oral Daily  . sertraline  100 mg Oral Daily   Continuous Infusions: . sodium chloride 75 mL/hr at 12/02/15 1108     LOS: 1 day    Nathan Moctezuma, Scheryl MartenSTEPHEN K, MD [facility] Pager [phone]  If 7PM-7AM, please contact night-coverage [email or web address] Password [email or web address] 12/02/2015, 5:09 PM

## 2015-12-02 NOTE — Care Management Obs Status (Signed)
MEDICARE OBSERVATION STATUS NOTIFICATION   Patient Details  Name: Lauren Harmon MRN: [id number] Date of Birth: 11/23/1950   Medicare Observation Status Notification Given:  Yes    Senan Urey, Chrystine OilerSharley Diane, RN 12/02/2015, 2:20 PM

## 2015-12-02 NOTE — Clinical Social Work Note (Signed)
Clinical Social Work Assessment  Patient Details  Name: Ander SladeDorothy J Papa MRN: [id number] Date of Birth: 07/01/1950  Date of referral:  12/02/15               Reason for consult:  Facility Placement                Permission sought to share information with:    Permission granted to share information::     Name::        Agency::     Relationship::     Contact Information:     Housing/Transportation Living arrangements for the past 2 months:  Mobile Home Source of Information:  Patient Patient Interpreter Needed:  None Criminal Activity/Legal Involvement Pertinent to Current Situation/Hospitalization:  No - Comment as needed Significant Relationships:  Adult Children Lives with:  Adult Children Do you feel safe going back to the place where you live?  No (Doesn't feel that her son who lives with her can provide adequate care for her.) Need for family participation in patient care:  Yes (Comment)  Care giving concerns:  Patient says her son is not able to provide adequate care for her.    Social Worker assessment / plan:  Patient indicated that she lives in a mobile home with her adult son. She stated that she mainly stays in the bed due to her mobility issues. Patient indicated that she has a wheelchair, but the wheelchair is too big for her mobile home. Patient stated that she completes bathing on the edge from her bed with a water basin.  Patient was agreeable to go to SNF. [facility].  CSW provided SNF list.   Employment status:  Disabled (Comment on whether or not currently receiving Disability) Insurance information:  [place] [employer] PT Recommendations:  Skilled Nursing Facility Information / Referral to community resources:  Skilled Nursing Facility  Patient/Family's Response to care:  Patient is agreeable to go to SNF.  Patient/Family's Understanding of and Emotional Response to Diagnosis,  Current Treatment, and Prognosis: Patient understands her diagnosis, treatment and prognosis.    Emotional Assessment Appearance:  Appears stated age Attitude/Demeanor/Rapport:   (Cooperative) Affect (typically observed):  Accepting, Calm Orientation:  Oriented to Self, Oriented to Place, Oriented to  Time, Oriented to Situation Alcohol / Substance use:  Not Applicable Psych involvement (Current and /or in the community):  Yes (Comment)  Discharge Needs  Concerns to be addressed:  Discharge Planning Concerns Readmission within the last 30 days:  No Current discharge risk:  Psychiatric Illness, Dependent with Mobility Barriers to Discharge:  No Barriers Identified   Annice NeedySettle, Jazilyn Siegenthaler D, LCSW 12/02/2015, 2:50 PM

## 2015-12-02 NOTE — Evaluation (Signed)
Physical Therapy Evaluation Patient Details Name: Lauren Harmon MRN: [id number] DOB: 07/20/1950 Today's Date: 12/02/2015   History of Present Illness  65 yo F admitted 12/01/2015 with generalized weakness, and fatigue. W/C dependent at baseline. Confined to bedroom for 1 month. Dx: UTI. PMH: chronic anxiety, depression, obesity, DJD, R TKA, panic attacks, HTN, GERD   Clinical Impression  Pt received in bed, and was agreeable to PT evaluation.  Pt states that she lives with her son, who is not able to provide the assistance that she needs.  He is gone during the day.  For the past month she has been restricted to mobility in her room, which mainly consists of transfers from the bed<>BSC.  She states she cannot maneuver in the house with her w/c because the house is too small.  During PT evaluation today she required Max A for lateral transfer from the bed<>BSC.  Pt would benefit from further skilled PT at SNF level to improve her functional mobility, strength and endurance.      Follow Up Recommendations SNF    Equipment Recommendations  None recommended by PT    Recommendations for Other Services       Precautions / Restrictions Precautions Precautions: Fall Precaution Comments: due to immobility Restrictions Weight Bearing Restrictions: No      Mobility  Bed Mobility Overal bed mobility: Needs Assistance Bed Mobility: Supine to Sit;Sit to Supine Rolling: Min guard   Supine to sit: Min assist;HOB elevated Sit to supine: Mod assist (to lift LE's into the bed. )      Transfers Overall transfer level: Needs assistance Equipment used: None Transfers: Lateral/Scoot Transfers          Lateral/Scoot Transfers: Max assist General transfer comment: Going to the right to get [facility] the [facility].  Pt is not able to stand due to pain in L knee, and weakness of R LE.   Ambulation/Gait Ambulation/Gait assistance:  (NA)              Stairs            Wheelchair  Mobility    Modified Rankin (Stroke Patients Only)       Balance Overall balance assessment: Modified Independent                                           Pertinent Vitals/Pain Pain Assessment: No/denies pain    Home Living   Living Arrangements: Children (son - he is not there all the time. )   Type of Home: Mobile home Home Access: Ramped entrance     Home Layout: One level Home Equipment: Bedside commode;Walker - 4 wheels;Walker - 2 wheels;Cane - single point;Wheelchair - Manufacturing systems engineermanual;Shower seat;Other (comment) (Sliding board)      Prior Function Level of Independence: Needs assistance   Gait / Transfers Assistance Needed: Pt states that she slides over and pivots or uses a sliding board to get over to the w/c or BSC.  Pt states there is not enough room for her to maneuver the w/c in the house.    ADL's / Homemaking Assistance Needed: Pt states that she can get herself dressed with increased time and effort.  Pt takes a sponge bath independently sitting on the EOB.   Comments: Pt states that she has been in the bed for ~5862month     Hand Dominance   Dominant Hand: Right  Extremity/Trunk Assessment   Upper Extremity Assessment: Generalized weakness           Lower Extremity Assessment: Generalized weakness;RLE deficits/detail;LLE deficits/detail RLE Deficits / Details: Grossly 2/5 LLE Deficits / Details: Grossly 3/5     Communication   Communication: No difficulties  Cognition Arousal/Alertness: Awake/alert Behavior During Therapy: WFL for tasks assessed/performed Overall Cognitive Status: Within Functional Limits for tasks assessed                      General Comments      Exercises        Assessment/Plan    PT Assessment Patient needs continued PT services  PT Diagnosis Generalized weakness;Acute pain   PT Problem List Decreased strength;Decreased range of motion;Decreased activity tolerance;Decreased  balance;Decreased mobility;Decreased knowledge of use of DME;Decreased safety awareness;Decreased knowledge of precautions;Obesity;Pain  PT Treatment Interventions DME instruction;Stair training;Functional mobility training;Therapeutic activities;Therapeutic exercise;Patient/family education;Wheelchair mobility training   PT Goals (Current goals can be found in the Care Plan section) Acute Rehab PT Goals Patient Stated Goal: I need to go somewhere where I can get more help.  PT Goal Formulation: With patient Time For Goal Achievement: 12/09/15    Frequency Min 4X/week   Barriers to discharge Inaccessible home environment;Decreased caregiver support      Co-evaluation               End of Session Equipment Utilized During Treatment: Gait belt Activity Tolerance: Patient limited by fatigue Patient left: in bed;with call bell/phone within reach      Functional Assessment Tool Used: [employer] "6-clicks"  Functional Limitation: Mobility: Walking and moving around Mobility: Walking and Moving Around Current Status [phone]): At least 60 percent but less than 80 percent impaired, limited or restricted Mobility: Walking and Moving Around Goal Status [phone]): At least 40 percent but less than 60 percent impaired, limited or restricted    Time: 1258-1340 PT Time Calculation (min) (ACUTE ONLY): 42 min   Charges:   PT Evaluation $PT Eval Moderate Complexity: 1 Procedure PT Treatments $Therapeutic Activity: 23-37 mins   PT G Codes:   PT G-Codes **NOT FOR INPATIENT CLASS** Functional Assessment Tool Used: [employer] "6-clicks"  Functional Limitation: Mobility: Walking and moving around Mobility: Walking and Moving Around Current Status [phone]): At least 60 percent but less than 80 percent impaired, limited or restricted Mobility: Walking and Moving Around Goal Status [phone]): At least 40 percent but less than 60 percent impaired, limited or restricted     Beth Tyrek Lawhorn, PT, DPT X: [phone]

## 2015-12-03 DIAGNOSIS — R5381 Other malaise: Secondary | ICD-10-CM | POA: Diagnosis not present

## 2015-12-03 DIAGNOSIS — N390 Urinary tract infection, site not specified: Secondary | ICD-10-CM | POA: Diagnosis not present

## 2015-12-03 DIAGNOSIS — I10 Essential (primary) hypertension: Secondary | ICD-10-CM | POA: Diagnosis not present

## 2015-12-03 DIAGNOSIS — F329 Major depressive disorder, single episode, unspecified: Secondary | ICD-10-CM | POA: Diagnosis not present

## 2015-12-03 LAB — BASIC METABOLIC PANEL
Anion gap: 8 (ref 5–15)
BUN: 15 mg/dL (ref 6–20)
CHLORIDE: 101 mmol/L (ref 101–111)
CO2: 25 mmol/L (ref 22–32)
CREATININE: 0.88 mg/dL (ref 0.44–1.00)
Calcium: 8.7 mg/dL — ABNORMAL LOW (ref 8.9–10.3)
GFR calc non Af Amer: >60 mL/min (ref >60)
Glucose, Bld: 92 mg/dL (ref 65–99)
Potassium: 3.8 mmol/L (ref 3.5–5.1)
Sodium: 134 mmol/L — ABNORMAL LOW (ref 135–145)

## 2015-12-03 NOTE — Care Management Note (Signed)
Case Management Note  Patient Details  Name: Lauren Harmon MRN: [id number] Date of Birth: 06/15/1950    Expected Discharge Date:  12/05/15               Expected Discharge Plan:  Skilled Nursing Facility  In-House Referral:  Clinical Social Work  Discharge planning Services  CM Consult  Post Acute Care Choice:  NA Choice offered to:  NA  DME Arranged:    DME Agency:     HH Arranged:    HH Agency:     Status of Service:  Completed, signed off  If discussed at [employer] Meetings, dates discussed:    Additional Comments: Patient from home, recommended for SNF, CSW aware and making arrangements.   Denine Brotz, Chrystine OilerSharley Diane, RN 12/03/2015, 12:52 PM

## 2015-12-03 NOTE — Progress Notes (Signed)
PROGRESS NOTE    Lauren Harmon  GMW:[id number] DOB: 02/27/1951 DOA: 12/01/2015 PCP: Frederica KusterMILLER, Charistopher Rumble M, MD  Brief Narrative:  65 y.o. female with history of severe and chronic anxiety/ depression, obestiy, DJD w R TKR, panic attacks, HTN, allergies, HL and GERD. Patient presents to ED c/o sig fatigue and listlessness, thinks it may be related to Zoloft and/or Valium that were started 1-2 mos ago. Unable to care for herself and her son is not able to care for her effectively either due to her size and immobility. In ED w/u showed UTI. Asked to see patient for UTI and poor home situation.   Assessment & Plan:   Principal Problem:   UTI (lower urinary tract infection) Active Problems:   Hypertension   Depression   Obesity   Anxiety   Debility   Wheelchair dependence   Intertriginous candidiasis, under breasts   Poor social situation  1. UTI -cont on Rocephin, IVF. Afebrile, no leukocytosis 2. Severe debility/ poor home situation - will likely need facility placement, consult PT/OT/social work 3. Fatigue - not sure zoloft or valium may be problematic. Patient wants to stop zoloft and also will reduce Valium to 5 mg bid (from 7.5 tid). Have consulted psychiatry for assistance with medication changes, pending 4. HTN - cont Lotrel, hold lasix 5. Vol - patient presented mildly dehydrated 6. Tinea rash - under breasts, Nystatin cream ordered  7. Depression - as per #3. Not taking trazodone/ Klonopin/ Effexor anymore. Per above, have consulted psychiatry for assistance with medication changes, pending input. 8. Muscle spasm - change to flexeril (dc zanaflex, pt request) 9. DJD sp R TKR, severe L knee DJD    DVT prophylaxis: Lovenox subcutaneous Code Status: Full Family Communication: Patient in room, family not at bedside Disposition Plan: Uncertain at this time   Consultants:  Psychiatry   Procedures:     Antimicrobials:      Subjective: Patient without  complaints  Objective: Filed Vitals:   12/02/15 1335 12/02/15 2100 12/03/15 0627 12/03/15 1426  BP: 138/82 142/67 144/70 134/84  Pulse: 66 66 66 68  Temp: 98.3 F (36.8 C) 98.2 F (36.8 C) 97.8 F (36.6 C) 98.3 F (36.8 C)  TempSrc: Oral Oral Oral Oral  Resp: 18 18 18 18   Height:      Weight:      SpO2: 96% 98% 97% 98%    Intake/Output Summary (Last 24 hours) at 12/03/15 1740 Last data filed at 12/03/15 1343  Gross per 24 hour  Intake    765 ml  Output    675 ml  Net     90 ml   Filed Weights   12/01/15 1334 12/01/15 2017  Weight: 117.935 kg (260 lb) 104.599 kg (230 lb 9.6 oz)    Examination:  General exam: Appears calm and comfortable, laying in bed Respiratory system: Clear to auscultation. Respiratory effort normal. Cardiovascular system: S1 & S2 heard, RRR.  Gastrointestinal system: Abdomen is nondistended, soft and nontender. No organomegaly or masses felt. Normal bowel sounds heard. Central nervous system: Alert and oriented. No focal neurological deficits. Extremities: Symmetric 5 x 5 power. Skin: No rashes, lesions Psychiatry: Judgement and insight appear normal. Mood & affect appropriate.     Data Reviewed: I have personally reviewed following labs and imaging studies  CBC:  Recent Labs Lab 12/01/15 1402  WBC 6.1  NEUTROABS 3.9  HGB 13.4  HCT 39.3  MCV 85.2  PLT 170   Basic Metabolic Panel:  Recent Labs  Lab 12/01/15 1402 12/03/15 0422  NA 132* 134*  K 4.1 3.8  CL 98* 101  CO2 25 25  GLUCOSE 99 92  BUN 10 15  CREATININE 0.85 0.88  CALCIUM 9.0 8.7*   GFR: Estimated Creatinine Clearance: 78.9 mL/min (by C-G formula based on Cr of 0.88). Liver Function Tests:  Recent Labs Lab 12/01/15 1402  AST 22  ALT 22  ALKPHOS 55  BILITOT 0.8  PROT 6.8  ALBUMIN 3.8   No results for input(s): LIPASE, AMYLASE in the last 168 hours. No results for input(s): AMMONIA in the last 168 hours. Coagulation Profile: No results for input(s): INR,  PROTIME in the last 168 hours. Cardiac Enzymes: No results for input(s): CKTOTAL, CKMB, CKMBINDEX, TROPONINI in the last 168 hours. BNP (last 3 results) No results for input(s): PROBNP in the last 8760 hours. HbA1C: No results for input(s): HGBA1C in the last 72 hours. CBG: No results for input(s): GLUCAP in the last 168 hours. Lipid Profile: No results for input(s): CHOL, HDL, LDLCALC, TRIG, CHOLHDL, LDLDIRECT in the last 72 hours. Thyroid Function Tests: No results for input(s): TSH, T4TOTAL, FREET4, T3FREE, THYROIDAB in the last 72 hours. Anemia Panel: No results for input(s): VITAMINB12, FOLATE, FERRITIN, TIBC, IRON, RETICCTPCT in the last 72 hours. Sepsis Labs: No results for input(s): PROCALCITON, LATICACIDVEN in the last 168 hours.  Recent Results (from the past 240 hour(s))  Urine culture     Status: Abnormal (Preliminary result)   Collection Time: 12/01/15  2:40 PM  Result Value Ref Range Status   Specimen Description URINE, CLEAN CATCH  Final   Special Requests NONE  Final   Culture >=100,000 COLONIES/mL ESCHERICHIA COLI (A)  Final   Report Status PENDING  Incomplete         Radiology Studies: No results found.   Scheduled Meds: . amLODipine  5 mg Oral Daily  . aspirin EC  81 mg Oral q morning - 10a  . atorvastatin  40 mg Oral q1800  . benazepril  10 mg Oral Daily  . cefTRIAXone (ROCEPHIN)  IV  2 g Intravenous Q24H  . diazepam  5 mg Oral BID  . docusate sodium  200 mg Oral Daily  . enoxaparin (LOVENOX) injection  40 mg Subcutaneous Q24H  . meloxicam  15 mg Oral Daily  . montelukast  10 mg Oral QHS  . nystatin   Topical BID  . pantoprazole  40 mg Oral Daily  . sertraline  100 mg Oral Daily   Continuous Infusions: . sodium chloride 75 mL/hr at 12/03/15 1518     LOS: 2 days    Keedan Sample, Scheryl Marten, MD [facility] Pager [phone]  If 7PM-7AM, please contact night-coverage [email or web address] Password [email or web address] 12/03/2015, 5:40 PM

## 2015-12-03 NOTE — Consult Note (Signed)
   [facility]   12/03/2015  Ander SladeDorothy J Harmon 12/25/1950 [id number]   Patient screened for potential [facility] Care Management services. Patient is eligible for [facility]. Electronic medical record reveals patient's discharge plan is for Skilled nursing for short term rehab and then remain long term care at facility,  there were no identifiable [facility] care [facility] needs at this time. [facility] Care [facility] services not appropriate at this time. If patient's post hospital needs change please place a [facility] Care Management consult.  For questions please contact:    Alben SpittleMary E. Albertha GheeNiemczura, RN, BSN, [facility]   Hospital Liaison [facility] [phone]) Business Cell  [phone]) Toll Free Office

## 2015-12-03 NOTE — Clinical Social Work Placement (Signed)
   CLINICAL SOCIAL WORK PLACEMENT  NOTE  Date:  12/03/2015  Patient Details  Name: Lauren Harmon MRN: [id number] Date of Birth: 06/16/1950  Clinical Social Work is seeking post-discharge placement for this patient at the Skilled  Nursing Facility level of care (*CSW will initial, date and re-position this form in  chart as items are completed):  Yes   Patient/family provided with [facility] Clinical Social Work Department's list of facilities offering this level of care within the geographic area requested by the patient (or if unable, by the patient's family).  Yes   Patient/family informed of their freedom to choose among providers that offer the needed level of care, that participate in Medicare, Medicaid or managed care program needed by the patient, have an available bed and are willing to accept the patient.  Yes   Patient/family informed of [facility]'s ownership interest [facility] and [facility], as well as of the fact that they are under no obligation to receive care at these facilities.  PASRR submitted to EDS on       PASRR number received on       Existing PASRR number confirmed on 12/02/15     FL2 transmitted to all facilities in geographic area requested by pt/family on 12/02/15     FL2 transmitted to all facilities within larger geographic area on       Patient informed that his/her managed care company has contracts with or will negotiate with certain facilities, including the following:        Yes   Patient/family informed of bed offers received.  [facility]     Physician recommends and patient chooses bed at      Patient to be transferred to   on  .  Patient to be transferred to facility by       Patient family notified on   of transfer.  Name of family member notified:        PHYSICIAN       Additional Comment:    _______________________________________________ Annice NeedySettle, Daryus Sowash D, LCSW 12/03/2015,  2:38 PM

## 2015-12-03 NOTE — Clinical Social Work Note (Signed)
At patient request, CSW left a message for patient's son, Doreatha MartinSam, advised that when patient is discharged [facility].      Carle Fenech, Juleen ChinaHeather D, LCSW

## 2015-12-04 DIAGNOSIS — N390 Urinary tract infection, site not specified: Secondary | ICD-10-CM | POA: Diagnosis not present

## 2015-12-04 DIAGNOSIS — M62838 Other muscle spasm: Secondary | ICD-10-CM | POA: Diagnosis not present

## 2015-12-04 DIAGNOSIS — Z888 Allergy status to other drugs, medicaments and biological substances status: Secondary | ICD-10-CM | POA: Diagnosis not present

## 2015-12-04 DIAGNOSIS — R279 Unspecified lack of coordination: Secondary | ICD-10-CM | POA: Diagnosis not present

## 2015-12-04 DIAGNOSIS — B358 Other dermatophytoses: Secondary | ICD-10-CM | POA: Diagnosis not present

## 2015-12-04 DIAGNOSIS — Z7982 Long term (current) use of aspirin: Secondary | ICD-10-CM | POA: Diagnosis not present

## 2015-12-04 DIAGNOSIS — M8580 Other specified disorders of bone density and structure, unspecified site: Secondary | ICD-10-CM | POA: Diagnosis not present

## 2015-12-04 DIAGNOSIS — R54 Age-related physical debility: Secondary | ICD-10-CM | POA: Diagnosis not present

## 2015-12-04 DIAGNOSIS — B3789 Other sites of candidiasis: Secondary | ICD-10-CM | POA: Diagnosis not present

## 2015-12-04 DIAGNOSIS — M25562 Pain in left knee: Secondary | ICD-10-CM | POA: Diagnosis not present

## 2015-12-04 DIAGNOSIS — E860 Dehydration: Secondary | ICD-10-CM | POA: Diagnosis not present

## 2015-12-04 DIAGNOSIS — F419 Anxiety disorder, unspecified: Secondary | ICD-10-CM | POA: Diagnosis not present

## 2015-12-04 DIAGNOSIS — I10 Essential (primary) hypertension: Secondary | ICD-10-CM | POA: Diagnosis not present

## 2015-12-04 DIAGNOSIS — K219 Gastro-esophageal reflux disease without esophagitis: Secondary | ICD-10-CM | POA: Diagnosis not present

## 2015-12-04 DIAGNOSIS — M1712 Unilateral primary osteoarthritis, left knee: Secondary | ICD-10-CM | POA: Diagnosis not present

## 2015-12-04 DIAGNOSIS — M25561 Pain in right knee: Secondary | ICD-10-CM | POA: Diagnosis not present

## 2015-12-04 DIAGNOSIS — F329 Major depressive disorder, single episode, unspecified: Secondary | ICD-10-CM | POA: Diagnosis not present

## 2015-12-04 DIAGNOSIS — Z609 Problem related to social environment, unspecified: Secondary | ICD-10-CM | POA: Diagnosis not present

## 2015-12-04 DIAGNOSIS — G9009 Other idiopathic peripheral autonomic neuropathy: Secondary | ICD-10-CM | POA: Diagnosis not present

## 2015-12-04 DIAGNOSIS — Z993 Dependence on wheelchair: Secondary | ICD-10-CM | POA: Diagnosis not present

## 2015-12-04 DIAGNOSIS — R278 Other lack of coordination: Secondary | ICD-10-CM | POA: Diagnosis not present

## 2015-12-04 DIAGNOSIS — M25512 Pain in left shoulder: Secondary | ICD-10-CM | POA: Diagnosis not present

## 2015-12-04 DIAGNOSIS — B372 Candidiasis of skin and nail: Secondary | ICD-10-CM | POA: Diagnosis not present

## 2015-12-04 DIAGNOSIS — Z7401 Bed confinement status: Secondary | ICD-10-CM | POA: Diagnosis not present

## 2015-12-04 DIAGNOSIS — E7800 Pure hypercholesterolemia, unspecified: Secondary | ICD-10-CM | POA: Diagnosis not present

## 2015-12-04 DIAGNOSIS — R5382 Chronic fatigue, unspecified: Secondary | ICD-10-CM | POA: Diagnosis not present

## 2015-12-04 DIAGNOSIS — J45909 Unspecified asthma, uncomplicated: Secondary | ICD-10-CM | POA: Diagnosis not present

## 2015-12-04 DIAGNOSIS — M6281 Muscle weakness (generalized): Secondary | ICD-10-CM | POA: Diagnosis not present

## 2015-12-04 LAB — URINE CULTURE

## 2015-12-04 LAB — BASIC METABOLIC PANEL
ANION GAP: 5 (ref 5–15)
BUN: 12 mg/dL (ref 6–20)
CHLORIDE: 104 mmol/L (ref 101–111)
CO2: 27 mmol/L (ref 22–32)
Calcium: 8.8 mg/dL — ABNORMAL LOW (ref 8.9–10.3)
Creatinine, Ser: 0.74 mg/dL (ref 0.44–1.00)
GFR calc non Af Amer: >60 mL/min (ref >60)
Glucose, Bld: 86 mg/dL (ref 65–99)
Potassium: 3.8 mmol/L (ref 3.5–5.1)
Sodium: 136 mmol/L (ref 135–145)

## 2015-12-04 LAB — GLUCOSE, CAPILLARY: Glucose-Capillary: 106 mg/dL — ABNORMAL HIGH (ref 65–99)

## 2015-12-04 MED ORDER — HYDROCODONE-ACETAMINOPHEN 7.5-325 MG PO TABS
1.0000 | ORAL_TABLET | ORAL | Status: DC | PRN
Start: 2015-12-04 — End: 2021-06-30

## 2015-12-04 MED ORDER — SERTRALINE HCL 25 MG PO TABS: 75.0000 mg | ORAL_TABLET | Freq: Every day | ORAL | Status: DC

## 2015-12-04 MED ORDER — CEFUROXIME AXETIL 500 MG PO TABS: 500.0000 mg | ORAL_TABLET | Freq: Two times a day (BID) | ORAL | Status: DC

## 2015-12-04 MED ORDER — SERTRALINE HCL 50 MG PO TABS: 75.0000 mg | ORAL_TABLET | Freq: Every day | ORAL | Status: DC

## 2015-12-04 MED ORDER — SERTRALINE HCL 50 MG PO TABS: 50.0000 mg | ORAL_TABLET | Freq: Every day | ORAL | Status: DC

## 2015-12-04 MED ORDER — DIAZEPAM 5 MG PO TABS: 5.0000 mg | ORAL_TABLET | Freq: Two times a day (BID) | ORAL | Status: DC

## 2015-12-04 NOTE — Care Management Note (Signed)
Case Management Note  Patient Details  Name: Lauren Harmon MRN: [id number] Date of Birth: 03/01/1951  Subjective/Objective:                    Action/Plan: [facility],  Son to transport per CSW.  CSW will contact son.      Expected Discharge Date:  12/05/15               Expected Discharge Plan:  Skilled Nursing Facility  In-House Referral:  Clinical Social Work  Discharge planning Services  CM Consult  Post Acute Care Choice:  NA Choice offered to:  NA  DME Arranged:    DME Agency:     HH Arranged:    HH Agency:     Status of Service:  Completed, signed off  If discussed at [employer] Meetings, dates discussed:    Additional Comments:  Adonis HugueninBerkhead, Lauren Ditmars L, RN 12/04/2015, 3:34 PM
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
## 2015-12-04 NOTE — Discharge Summary (Signed)
Physician Discharge Summary  NEYA CREEGAN ZOX:[id number] DOB: 1951/04/08 DOA: 12/01/2015  PCP: Frederica Kuster, MD  Admit date: 12/01/2015 Discharge date: 12/04/2015  Disposition:  SNF  Recommendations for Outpatient Follow-up:  1. Follow up with PCP in 1-2 weeks 2. Please obtain BMP/CBC in one week 3. Please follow up on the following pending results:   Discharge Condition:Stable CODE STATUS:Full Diet recommendation: Regular  Brief/Interim Summary:  65 y.o. female with history of severe and chronic anxiety/ depression, obestiy, DJD w R TKR, panic attacks, HTN, allergies, HL and GERD. Patient presents to ED c/o sig fatigue and listlessness, thinks it may be related to Zoloft and/or Valium that were started 1-2 mos ago. Unable to care for herself and her son is not able to care for her effectively either due to her size and immobility. In ED w/u showed UTI. Asked to see patient for UTI and poor home situation.  1. UTI -cont on Rocephin, IVF. Afebrile, no leukocytosis 2. Severe debility/ poor home situation - will likely need facility placement, consult PT/OT/social work 3. Fatigue - not sure zoloft or valium may be problematic.Patient wants to stop zoloft and also will reduce Valium to 5 mg bid (from 7.5 tid). Have consulted psychiatry for assistance with medication changes, Recs to continue zoloft at  daily 4. HTN - cont Lotrel, hold lasix 5. Vol - patient presented mildly dehydrated 6. Tinea rash - under breasts, Nystatin cream ordered  7. Depression - as per #3. Not taking trazodone/ Klonopin/ Effexor anymore. Per above, have consulted psychiatry for assistance with medication changes, Per psychiatry, plan for zoloft  daily 8. Muscle spasm - dc zanaflex, pt request 9. DJD sp R TKR, severe L knee DJD   Discharge Diagnoses:  Principal Problem:   UTI (lower urinary tract infection) Active Problems:   Hypertension   Depression   Obesity   Anxiety  Debility   Wheelchair dependence   Intertriginous candidiasis, under breasts   Poor social situation    Discharge Instructions     Medication List    STOP taking these medications        tiZANidine 4 MG tablet  Commonly known as:  ZANAFLEX      TAKE these medications        amLODipine-benazepril 5-10 MG capsule  Commonly known as:  LOTREL  Take 1 capsule by mouth daily.     aspirin EC 81 MG tablet  Take 81 mg by mouth every morning.     atorvastatin 40 MG tablet  Commonly known as:  LIPITOR  TAKE 1 TABLET (40 MG TOTAL) BY MOUTH DAILY.     cefUROXime 500 MG tablet  Commonly known as:  CEFTIN  Take 1 tablet (500 mg total) by mouth 2 (two) times daily with a meal.     diazepam 5 MG tablet  Commonly known as:  VALIUM  Take 7.5 mg by mouth 3 (three) times daily.     diazepam 5 MG tablet  Commonly known as:  VALIUM  Take 1 tablet (5 mg total) by mouth 2 (two) times daily.     docusate sodium 100 MG capsule  Commonly known as:  COLACE  Take 200 mg by mouth daily.     esomeprazole 40 MG capsule  Commonly known as:  NEXIUM  Take 1 capsule (40 mg total) by mouth daily.     FAST FREEZE PRO STYLE THERAPY EX  Apply 1 application topically 3 (three) times daily.     furosemide 20 MG  tablet  Commonly known as:  LASIX  Take 1 tablet (20 mg total) by mouth every other day.     HYDROcodone-acetaminophen 7.5-325 MG tablet  Commonly known as:  NORCO  Take 1 tablet by mouth every 4 (four) hours as needed for moderate pain.     HYDROcodone-acetaminophen 7.5-325 MG tablet  Commonly known as:  NORCO  Take 1 tablet by mouth every 4 (four) hours as needed for moderate pain.     meloxicam 15 MG tablet  Commonly known as:  MOBIC  TAKE 1 TABLET EVERY DAY     montelukast 10 MG tablet  Commonly known as:  SINGULAIR  TAKE 1 TABLET (10 MG TOTAL) BY MOUTH AT BEDTIME.     MUSCLE RUB 10-15 % Crea  Apply 1 application topically as needed for muscle pain.     sertraline 25 MG  tablet  Commonly known as:  ZOLOFT  Take 3 tablets (75 mg total) by mouth daily.  Start taking on:  12/05/2015     Vitamin D 2000 units Caps  Take 1 capsule by mouth daily.       Follow-up Information    [facility] .   Specialty:  Skilled Nursing Facility   Contact information:   [address] [place] [id number] [phone]      Schedule an appointment as soon as possible for a visit with [facility].   Why:  Medication Adjustment   Contact information:   [address] [place], [place] [address]       Allergies  Allergen Reactions  . Wellbutrin [Bupropion Hcl] Nausea Only    Consultations:  Psychiatry   Procedures/Studies: No results found.    Subjective: No complaints  Discharge Exam: Filed Vitals:   12/04/15 0927 12/04/15 1457  BP: 140/65 153/78  Pulse:  75  Temp:  97.4 F (36.3 C)  Resp:  16   Filed Vitals:   12/03/15 2116 12/04/15 0424 12/04/15 0927 12/04/15 1457  BP: 133/69 148/81 140/65 153/78  Pulse: 63 62  75  Temp: 97.9 F (36.6 C) 98 F (36.7 C)  97.4 F (36.3 C)  TempSrc: Oral Oral  Oral  Resp: 20 17  16   Height:      Weight:  107.9 kg (237 lb 14 oz)    SpO2: 97% 100%  97%    General: Pt is alert, awake, not in acute distress Cardiovascular: RRR, S1/S2 +, no rubs, no gallops Respiratory: CTA bilaterally, no wheezing, no rhonchi Abdominal: Soft, NT, ND, bowel sounds + Extremities: no edema, no cyanosis    The results of significant diagnostics from this hospitalization (including imaging, microbiology, ancillary and laboratory) are listed below for reference.     Microbiology: Recent Results (from the past 240 hour(s))  Urine culture     Status: Abnormal   Collection Time: 12/01/15  2:40 PM  Result Value Ref Range Status   Specimen Description URINE, CLEAN CATCH  Final   Special Requests NONE  Final   Culture >=100,000 COLONIES/mL ESCHERICHIA COLI (A)  Final   Report Status  12/04/2015 FINAL  Final   Organism ID, Bacteria ESCHERICHIA COLI (A)  Final      Susceptibility   Escherichia coli - MIC*    AMPICILLIN >=32 RESISTANT Resistant     CEFAZOLIN >=64 RESISTANT Resistant     CEFTRIAXONE <=1 SENSITIVE Sensitive     CIPROFLOXACIN <=0.25 SENSITIVE Sensitive     GENTAMICIN <=1 SENSITIVE Sensitive  IMIPENEM <=0.25 SENSITIVE Sensitive     NITROFURANTOIN <=16 SENSITIVE Sensitive     TRIMETH/SULFA <=20 SENSITIVE Sensitive     AMPICILLIN/SULBACTAM >=32 RESISTANT Resistant     PIP/TAZO 8 SENSITIVE Sensitive     * >=100,000 COLONIES/mL ESCHERICHIA COLI     Labs: BNP (last 3 results) No results for input(s): BNP in the last 8760 hours. Basic Metabolic Panel:  Recent Labs Lab 12/01/15 1402 12/03/15 0422 12/04/15 0415  NA 132* 134* 136  K 4.1 3.8 3.8  CL 98* 101 104  CO2 25 25 27   GLUCOSE 99 92 86  BUN 10 15 12   CREATININE 0.85 0.88 0.74  CALCIUM 9.0 8.7* 8.8*   Liver Function Tests:  Recent Labs Lab 12/01/15 1402  AST 22  ALT 22  ALKPHOS 55  BILITOT 0.8  PROT 6.8  ALBUMIN 3.8   No results for input(s): LIPASE, AMYLASE in the last 168 hours. No results for input(s): AMMONIA in the last 168 hours. CBC:  Recent Labs Lab 12/01/15 1402  WBC 6.1  NEUTROABS 3.9  HGB 13.4  HCT 39.3  MCV 85.2  PLT 170   Cardiac Enzymes: No results for input(s): CKTOTAL, CKMB, CKMBINDEX, TROPONINI in the last 168 hours. BNP: Invalid input(s): POCBNP CBG:  Recent Labs Lab 12/04/15 0827  GLUCAP 106*   D-Dimer No results for input(s): DDIMER in the last 72 hours. Hgb A1c No results for input(s): HGBA1C in the last 72 hours. Lipid Profile No results for input(s): CHOL, HDL, LDLCALC, TRIG, CHOLHDL, LDLDIRECT in the last 72 hours. Thyroid function studies No results for input(s): TSH, T4TOTAL, T3FREE, THYROIDAB in the last 72 hours.  Invalid input(s): FREET3 Anemia work up No results for input(s): VITAMINB12, FOLATE, FERRITIN, TIBC, IRON,  RETICCTPCT in the last 72 hours. Urinalysis    Component Value Date/Time   COLORURINE YELLOW 12/01/2015 1440   APPEARANCEUR CLEAR 12/01/2015 1440   LABSPEC <1.005* 12/01/2015 1440   PHURINE 6.0 12/01/2015 1440   GLUCOSEU NEGATIVE 12/01/2015 1440   HGBUR SMALL* 12/01/2015 1440   BILIRUBINUR NEGATIVE 12/01/2015 1440   BILIRUBINUR neg 03/18/2015 0914   KETONESUR TRACE* 12/01/2015 1440   PROTEINUR NEGATIVE 12/01/2015 1440   PROTEINUR neg 03/18/2015 0914   UROBILINOGEN negative 03/18/2015 0914   UROBILINOGEN 0.2 06/19/2014 1941   NITRITE POSITIVE* 12/01/2015 1440   NITRITE neg 03/18/2015 0914   LEUKOCYTESUR SMALL* 12/01/2015 1440   Sepsis Labs Invalid input(s): PROCALCITONIN,  WBC,  LACTICIDVEN Microbiology Recent Results (from the past 240 hour(s))  Urine culture     Status: Abnormal   Collection Time: 12/01/15  2:40 PM  Result Value Ref Range Status   Specimen Description URINE, CLEAN CATCH  Final   Special Requests NONE  Final   Culture >=100,000 COLONIES/mL ESCHERICHIA COLI (A)  Final   Report Status 12/04/2015 FINAL  Final   Organism ID, Bacteria ESCHERICHIA COLI (A)  Final      Susceptibility   Escherichia coli - MIC*    AMPICILLIN >=32 RESISTANT Resistant     CEFAZOLIN >=64 RESISTANT Resistant     CEFTRIAXONE <=1 SENSITIVE Sensitive     CIPROFLOXACIN <=0.25 SENSITIVE Sensitive     GENTAMICIN <=1 SENSITIVE Sensitive     IMIPENEM <=0.25 SENSITIVE Sensitive     NITROFURANTOIN <=16 SENSITIVE Sensitive     TRIMETH/SULFA <=20 SENSITIVE Sensitive     AMPICILLIN/SULBACTAM >=32 RESISTANT Resistant     PIP/TAZO 8 SENSITIVE Sensitive     * >=100,000 COLONIES/mL ESCHERICHIA COLI     SIGNED:  Maleeah Crossman, Scheryl Marten, MD  [facility] 12/04/2015, 4:05 PM   If 7PM-7AM, please contact night-coverage [email or web address] Password [email or web address]

## 2015-12-04 NOTE — Progress Notes (Signed)
[facility]. Family notified of transfer and report given to facility.

## 2015-12-08 ENCOUNTER — Other Ambulatory Visit: Payer: Self-pay | Admitting: *Deleted

## 2015-12-08 MED ORDER — MELOXICAM 15 MG PO TABS: 15.0000 mg | ORAL_TABLET | Freq: Every day | ORAL | 1 refills | Status: DC

## 2015-12-09 ENCOUNTER — Ambulatory Visit: Payer: Medicare Other | Admitting: Family Medicine

## 2015-12-13 ENCOUNTER — Other Ambulatory Visit: Payer: Self-pay | Admitting: Family Medicine

## 2015-12-13 DIAGNOSIS — I10 Essential (primary) hypertension: Secondary | ICD-10-CM | POA: Diagnosis not present

## 2015-12-16 ENCOUNTER — Ambulatory Visit (INDEPENDENT_AMBULATORY_CARE_PROVIDER_SITE_OTHER): Payer: Medicare Other | Admitting: Orthopaedic Surgery

## 2015-12-16 ENCOUNTER — Encounter: Payer: Self-pay | Admitting: Orthopaedic Surgery

## 2015-12-16 VITALS — BP 144/79 | HR 60 | Temp 97.9°F

## 2015-12-16 DIAGNOSIS — M25562 Pain in left knee: Secondary | ICD-10-CM

## 2015-12-16 DIAGNOSIS — M5442 Lumbago with sciatica, left side: Secondary | ICD-10-CM

## 2015-12-16 DIAGNOSIS — M25512 Pain in left shoulder: Secondary | ICD-10-CM | POA: Diagnosis not present

## 2015-12-16 DIAGNOSIS — E669 Obesity, unspecified: Secondary | ICD-10-CM

## 2015-12-16 DIAGNOSIS — I10 Essential (primary) hypertension: Secondary | ICD-10-CM

## 2015-12-16 NOTE — Progress Notes (Signed)
CC:  My knee and my shoulder hurt on the left  She is now in a nursing home.  She entered it on July 21.  They are doing therapy for her left knee and left shoulder. Both are tender.  She has no new trauma.  She would like injections.  ROM of the left knee is 0 to 95 with crepitus and pain.  ROM of the left shoulder is flexion to 165, abduction 145, extension 10, adduction 40, internal 30, external 30.  NV intact.  Encounter Diagnoses  Name Primary?  . Left shoulder pain Yes  . Left knee pain   . Obesity   . Essential hypertension   . Left-sided low back pain with left-sided sciatica    PROCEDURE NOTE:  The patient request injection, verbal consent was obtained.  The left shoulder was prepped appropriately after time out was performed.   Sterile technique was observed and injection of 1 cc of Depo-Medrol 40 mg with several cc's of plain xylocaine. Anesthesia was provided by ethyl chloride and a 20-gauge needle was used to inject the shoulder area. A posterior approach was used.  The injection was tolerated well.  A band aid dressing was applied.  The patient was advised to apply ice later today and tomorrow to the injection sight as needed.  PROCEDURE NOTE:  The patient requests injections of the left knee , verbal consent was obtained.  The left knee was prepped appropriately after time out was performed.   Sterile technique was observed and injection of 1 cc of Depo-Medrol 40 mg with several cc's of plain xylocaine. Anesthesia was provided by ethyl chloride and a 20-gauge needle was used to inject the knee area. The injection was tolerated well.  A band aid dressing was applied.  The patient was advised to apply ice later today and tomorrow to the injection sight as needed. Return in three months.  Forms for nursing home completed.  Call if any problem.  Electronically Signed Darreld Mclean, MD 8/2/20172:56 PM

## 2015-12-28 ENCOUNTER — Other Ambulatory Visit: Payer: Self-pay | Admitting: *Deleted

## 2015-12-28 MED ORDER — TRAZODONE HCL 50 MG PO TABS: 50.0000 mg | ORAL_TABLET | Freq: Every day | ORAL | 0 refills | Status: DC

## 2016-01-14 DIAGNOSIS — I10 Essential (primary) hypertension: Secondary | ICD-10-CM | POA: Diagnosis not present

## 2016-02-01 DIAGNOSIS — H25813 Combined forms of age-related cataract, bilateral: Secondary | ICD-10-CM | POA: Diagnosis not present

## 2016-02-06 ENCOUNTER — Other Ambulatory Visit: Payer: Self-pay | Admitting: Family Medicine

## 2016-02-18 ENCOUNTER — Other Ambulatory Visit: Payer: Self-pay | Admitting: Family Medicine

## 2016-02-25 DIAGNOSIS — I10 Essential (primary) hypertension: Secondary | ICD-10-CM | POA: Diagnosis not present

## 2016-03-17 ENCOUNTER — Ambulatory Visit: Payer: Self-pay | Admitting: Orthopaedic Surgery

## 2016-03-22 ENCOUNTER — Ambulatory Visit: Payer: Self-pay | Admitting: Orthopaedic Surgery

## 2016-04-06 ENCOUNTER — Ambulatory Visit (INDEPENDENT_AMBULATORY_CARE_PROVIDER_SITE_OTHER): Payer: Medicare Other | Admitting: Orthopaedic Surgery

## 2016-04-06 VITALS — BP 157/90 | HR 63 | Temp 97.7°F | Ht 66.0 in | Wt 276.0 lb

## 2016-04-06 DIAGNOSIS — M25562 Pain in left knee: Secondary | ICD-10-CM | POA: Diagnosis not present

## 2016-04-06 DIAGNOSIS — E6609 Other obesity due to excess calories: Secondary | ICD-10-CM

## 2016-04-06 DIAGNOSIS — IMO0001 Reserved for inherently not codable concepts without codable children: Secondary | ICD-10-CM

## 2016-04-06 DIAGNOSIS — G8929 Other chronic pain: Secondary | ICD-10-CM | POA: Diagnosis not present

## 2016-04-06 DIAGNOSIS — M25512 Pain in left shoulder: Secondary | ICD-10-CM | POA: Diagnosis not present

## 2016-04-06 DIAGNOSIS — Z6841 Body Mass Index (BMI) 40.0 and over, adult: Secondary | ICD-10-CM

## 2016-04-06 NOTE — Patient Instructions (Signed)
Forms completed for nursing home.

## 2016-04-06 NOTE — Progress Notes (Signed)
CC:  I have pain of my left knee. I would like an injection.  The patient has chronic pain of the left knee.  There is no recent trauma.  There is no redness.  Injections in the past have helped.  The knee has no redness, has an effusion and crepitus present.  ROM of the left knee is 0-95.  Impression:  Chronic knee pain left  Return: 3 months  PROCEDURE NOTE:  The patient requests injections of the left knee, verbal consent was obtained.  The left knee was prepped appropriately after time out was performed.   Sterile technique was observed and injection of 1 cc of Depo-Medrol 40 mg with several cc's of plain xylocaine. Anesthesia was provided by ethyl chloride and a 20-gauge needle was used to inject the knee area. The injection was tolerated well.  A band aid dressing was applied.  The patient was advised to apply ice later today and tomorrow to the injection sight as needed.  PROCEDURE NOTE:  The patient request injection, verbal consent was obtained.  The right shoulder was prepped appropriately after time out was performed.   Sterile technique was observed and injection of 1 cc of Depo-Medrol 40 mg with several cc's of plain xylocaine. Anesthesia was provided by ethyl chloride and a 20-gauge needle was used to inject the shoulder area. A posterior approach was used.  The injection was tolerated well.  A band aid dressing was applied.  The patient was advised to apply ice later today and tomorrow to the injection sight as needed.  Electronically Signed Darreld McleanWayne Dario Yono, MD 11/22/20179:10 AM

## 2016-07-20 ENCOUNTER — Ambulatory Visit: Payer: Self-pay | Admitting: Orthopaedic Surgery

## 2016-07-27 ENCOUNTER — Ambulatory Visit: Payer: Self-pay | Admitting: Orthopaedic Surgery

## 2016-08-10 ENCOUNTER — Ambulatory Visit: Payer: Medicare Other | Admitting: Orthopaedic Surgery

## 2017-03-08 ENCOUNTER — Ambulatory Visit (INDEPENDENT_AMBULATORY_CARE_PROVIDER_SITE_OTHER): Payer: Medicare Other | Admitting: Orthopaedic Surgery

## 2017-03-08 ENCOUNTER — Encounter: Payer: Self-pay | Admitting: Orthopaedic Surgery

## 2017-03-08 ENCOUNTER — Ambulatory Visit (INDEPENDENT_AMBULATORY_CARE_PROVIDER_SITE_OTHER): Payer: Medicare Other

## 2017-03-08 VITALS — BP 155/88 | HR 72 | Temp 96.8°F

## 2017-03-08 DIAGNOSIS — M25512 Pain in left shoulder: Secondary | ICD-10-CM | POA: Diagnosis not present

## 2017-03-08 DIAGNOSIS — M25561 Pain in right knee: Secondary | ICD-10-CM

## 2017-03-08 DIAGNOSIS — M25562 Pain in left knee: Secondary | ICD-10-CM | POA: Diagnosis not present

## 2017-03-08 DIAGNOSIS — G8929 Other chronic pain: Secondary | ICD-10-CM | POA: Diagnosis not present

## 2017-03-08 NOTE — Progress Notes (Signed)
Patient Lauren Harmon Lauren Harmon, female DOB:05/09/1951, 66 y.o. WUJ:[id number]  Chief Complaint  Patient presents with  . Follow-up    Bilateral knee pain    HPI  GESSICA Harmon is a 66 y.o. female who has pain in both knees and the left shoulder.  She is a resident in local nursing home.  She does not walk much.  She has no new trauma.  She has popping and swelling of both knees.  The left shoulder is bothering her more and more.  She has limited motion of her shoulder and pain most of the time.  She has no paresthesias. HPI  There is no height or weight on file to calculate BMI.  ROS  Review of Systems  HENT: Negative for congestion.   Respiratory: Negative for cough and shortness of breath.   Cardiovascular: Negative for chest pain and leg swelling.  Endocrine: Positive for cold intolerance.  Musculoskeletal: Positive for arthralgias, back pain, gait problem, joint swelling and myalgias.  Allergic/Immunologic: Positive for environmental allergies.  Psychiatric/Behavioral: The patient is nervous/anxious.     Past Medical History:  Diagnosis Date  . Allergy   . Anxiety   . Asthma   . Depression   . GERD (gastroesophageal reflux disease)   . H/O: knee surgery   . Hemorrhoid   . Hypercholesteremia   . Hypertension   . Osteoarthritis   . Osteopenia   . Palpitations   . Panic attack     Past Surgical History:  Procedure Laterality Date  . BALLOON DILATION N/A 07/02/2014   Procedure: BALLOON DILATION;  Surgeon: Iva Boop, MD;  Location: [facility];  Service: Endoscopy;  Laterality: N/A;  . BOTOX INJECTION N/A 07/02/2014   Procedure: BOTOX INJECTION;  Surgeon: Iva Boop, MD;  Location: [facility];  Service: Endoscopy;  Laterality: N/A;  . ESOPHAGOGASTRODUODENOSCOPY N/A 07/02/2014   Procedure: ESOPHAGOGASTRODUODENOSCOPY (EGD);  Surgeon: Iva Boop, MD;  Location: [facility];  Service: Endoscopy;  Laterality: N/A;  with possible dilation or botox  . EYE  SURGERY    . KNEE SURGERY      Family History  Problem Relation Age of Onset  . Osteoarthritis Mother   . Hypertension Mother   . Hypertension Father   . Hyperlipidemia Father   . Heart disease Father   . Osteoarthritis Father   . Lung cancer Maternal Uncle        smoker  . Colon polyps Neg Hx   . Colon cancer Neg Hx     Social History Social History  Substance Use Topics  . Smoking status: Never Smoker  . Smokeless tobacco: Never Used  . Alcohol use No    Allergies  Allergen Reactions  . Wellbutrin [Bupropion Hcl] Nausea Only    Current Outpatient Prescriptions  Medication Sig Dispense Refill  . amLODipine-benazepril (LOTREL) 5-10 MG capsule TAKE 1 CAPSULE BY MOUTH DAILY. 90 capsule 0  . aspirin EC 81 MG tablet Take 81 mg by mouth every morning.     Marland Kitchen atorvastatin (LIPITOR) 40 MG tablet TAKE 1 TABLET (40 MG TOTAL) BY MOUTH DAILY. 90 tablet 0  . cefUROXime (CEFTIN) 500 MG tablet Take 1 tablet (500 mg total) by mouth 2 (two) times daily with a meal. 4 tablet 0  . Cholecalciferol (VITAMIN D) 2000 UNITS CAPS Take 1 capsule by mouth daily.    . diazepam (VALIUM) 5 MG tablet Take 7.5 mg by mouth 3 (three) times daily.    . diazepam (VALIUM) 5 MG tablet  Take 1 tablet (5 mg total) by mouth 2 (two) times daily. 10 tablet 0  . docusate sodium (COLACE) 100 MG capsule Take 200 mg by mouth daily.     Marland Kitchen. esomeprazole (NEXIUM) 40 MG capsule Take 1 capsule (40 mg total) by mouth daily. 90 capsule 1  . furosemide (LASIX) 20 MG tablet Take 1 tablet (20 mg total) by mouth every other day. 30 tablet 5  . HYDROcodone-acetaminophen (NORCO) 7.5-325 MG per tablet Take 1 tablet by mouth every 4 (four) hours as needed for moderate pain.     Marland Kitchen. HYDROcodone-acetaminophen (NORCO) 7.5-325 MG tablet Take 1 tablet by mouth every 4 (four) hours as needed for moderate pain. 10 tablet 0  . meloxicam (MOBIC) 15 MG tablet Take 1 tablet (15 mg total) by mouth daily. 30 tablet 1  . Menthol, Topical Analgesic,  (FAST FREEZE PRO STYLE THERAPY EX) Apply 1 application topically 3 (three) times daily.    . Menthol-Methyl Salicylate (MUSCLE RUB) 10-15 % CREA Apply 1 application topically as needed for muscle pain.    . montelukast (SINGULAIR) 10 MG tablet TAKE 1 TABLET (10 MG TOTAL) BY MOUTH AT BEDTIME. 90 tablet 0  . sertraline (ZOLOFT) 25 MG tablet Take 3 tablets (75 mg total) by mouth daily. 30 tablet 0  . traZODone (DESYREL) 50 MG tablet Take 1 tablet (50 mg total) by mouth at bedtime. 30 tablet 0   No current facility-administered medications for this visit.      Physical Exam  Blood pressure (!) 155/88, pulse 72, temperature (!) 96.8 F (36 C), last menstrual period 08/09/2002.  Constitutional: overall normal hygiene, normal nutrition, well developed, normal grooming, normal body habitus. Assistive device:wheelchair  Musculoskeletal: gait and station Limp unable to stand., muscle tone and strength are normal, no tremors or atrophy is present.  .  Neurological: coordination overall normal.  Deep tendon reflex/nerve stretch intact.  Sensation normal.  Cranial nerves II-XII intact.   Skin:   Normal overall no scars, lesions, ulcers or rashes. No psoriasis.  Psychiatric: Alert and oriented x 3.  Recent memory intact, remote memory unclear.  Normal mood and affect. Well groomed.  Good eye contact.  Cardiovascular: overall no swelling, no varicosities, no edema bilaterally, normal temperatures of the legs and arms, no clubbing, cyanosis and good capillary refill.  Lymphatic: palpation is normal.  All other systems reviewed and are negative   Left shoulder has marked decreased motion with only about forward flexion of 30, abduction 30, internal 10  External 5, extension 0 and adduction 20.  Crepitus is present.  The bilateral lower extremity is examined:  Inspection:  Thigh:  Non-tender and no defects  Knee has swelling 1+ effusion.                        Joint tenderness is present                         Patient is tender over the medial joint line  Lower Leg:  Has normal appearance and no tenderness or defects  Ankle:  Non-tender and no defects  Foot:  Non-tender and no defects Range of Motion:  Knee:  Range of motion is: 0-95 right, 0 to 100 left                        Crepitus is  present  Ankle:  Range of motion is normal. Strength and  Tone:  The bilateral lower extremity has normal strength and tone. Stability:  Knee:  The knee is stable.  Ankle:  The ankle is stable.   The patient has been educated about the nature of the problem(s) and counseled on treatment options.  The patient appeared to understand what I have discussed and is in agreement with it.  Encounter Diagnoses  Name Primary?  . Chronic left shoulder pain Yes  . Chronic pain of left knee   . Chronic pain of right knee     PROCEDURE NOTE:  The patient requests injections of the left knee , verbal consent was obtained.  The left knee was prepped appropriately after time out was performed.   Sterile technique was observed and injection of 1 cc of Depo-Medrol 40 mg with several cc's of plain xylocaine. Anesthesia was provided by ethyl chloride and a 20-gauge needle was used to inject the knee area. The injection was tolerated well.  A band aid dressing was applied.  The patient was advised to apply ice later today and tomorrow to the injection sight as needed.  PROCEDURE NOTE:  The patient requests injections of the right knee , verbal consent was obtained.  The right knee was prepped appropriately after time out was performed.   Sterile technique was observed and injection of 1 cc of Depo-Medrol 40 mg with several cc's of plain xylocaine. Anesthesia was provided by ethyl chloride and a 20-gauge needle was used to inject the knee area. The injection was tolerated well.  A band aid dressing was applied.  The patient was advised to apply ice later today and tomorrow to the injection sight as  needed.  X-rays were done of the left shoulder showing severe degenerative changes and loss of joint space, reported separately.  PLAN Call if any problems.  Precautions discussed.  Continue current medications.   Return to clinic 3 months   Forms for nursing home completed.  Electronically Signed Darreld Mclean, MD 10/24/201811:24 AM

## 2017-06-06 ENCOUNTER — Ambulatory Visit: Payer: Self-pay | Admitting: Orthopaedic Surgery

## 2017-06-15 ENCOUNTER — Ambulatory Visit (INDEPENDENT_AMBULATORY_CARE_PROVIDER_SITE_OTHER): Payer: Medicare Other | Admitting: Orthopaedic Surgery

## 2017-06-15 ENCOUNTER — Encounter: Payer: Self-pay | Admitting: Orthopaedic Surgery

## 2017-06-15 VITALS — BP 163/86 | HR 73 | Temp 98.0°F

## 2017-06-15 DIAGNOSIS — G8929 Other chronic pain: Secondary | ICD-10-CM

## 2017-06-15 DIAGNOSIS — M25561 Pain in right knee: Secondary | ICD-10-CM | POA: Diagnosis not present

## 2017-06-15 DIAGNOSIS — M25562 Pain in left knee: Secondary | ICD-10-CM | POA: Diagnosis not present

## 2017-06-15 DIAGNOSIS — Z6841 Body Mass Index (BMI) 40.0 and over, adult: Secondary | ICD-10-CM

## 2017-06-15 NOTE — Progress Notes (Signed)
CC: Both of my knees are hurting. I would like an injection in both knees.  The patient has had chronic pain and tenderness of both knees for some time.  Injections help.  There is no locking or giving way of the knee.  There is no new trauma. There is no redness or signs of infections.  The knees have a mild effusion and some crepitus.  There is no redness or signs of recent trauma.  Right knee ROM is 0-95 and left knee ROM is 0-90.  Impression:  Chronic pain of the both knees  Return:  prn  PROCEDURE NOTE:  The patient requests injections of both knees, verbal consent was obtained.  The left and right knee were individually prepped appropriately after time out was performed.   Sterile technique was observed and injection of 1 cc of Depo-Medrol 40 mg with several cc's of plain xylocaine. Anesthesia was provided by ethyl chloride and a 20-gauge needle was used to inject each knee area. The injections were tolerated well.  A band aid dressing was applied.  The patient was advised to apply ice later today and tomorrow to the injection sight as needed.  Form completed for nursing home.  Electronically Signed Darreld McleanWayne Nova Evett, MD 1/31/201910:26 AM

## 2017-09-28 IMAGING — DX DG LUMBAR SPINE COMPLETE 4+V
5 series · 5 of 5 positions shown · non-contrast
Comparison: CT scan of July 04, 2015.

CLINICAL DATA: Left-sided lower back pain and left-sided sciatica
for many years without known injury.

EXAM:
LUMBAR SPINE - COMPLETE 4+ VIEW

[l-spine ap]
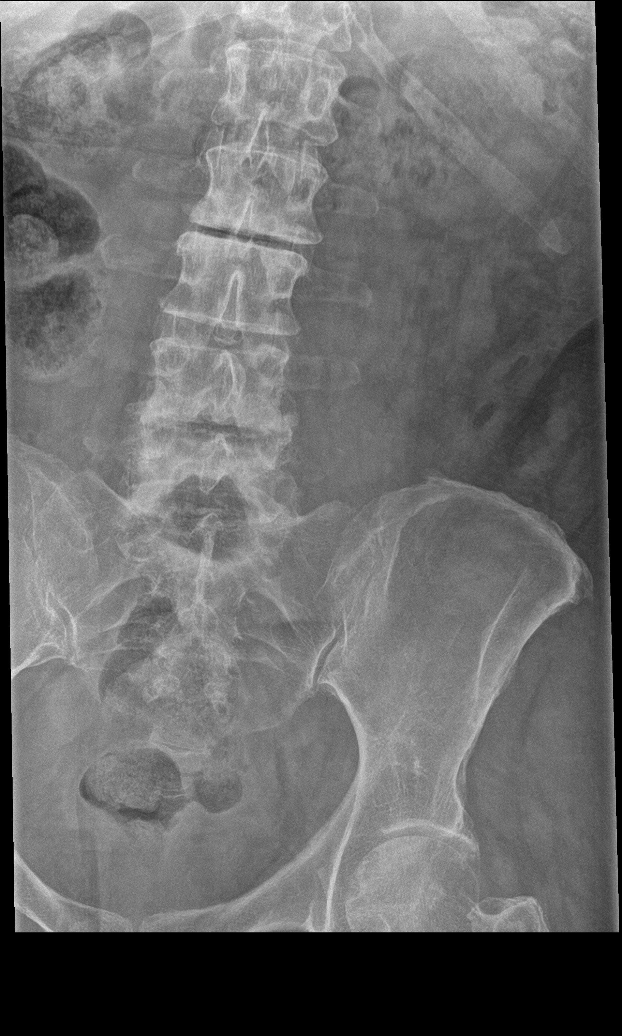

[l-spine obl (1 of 2)]
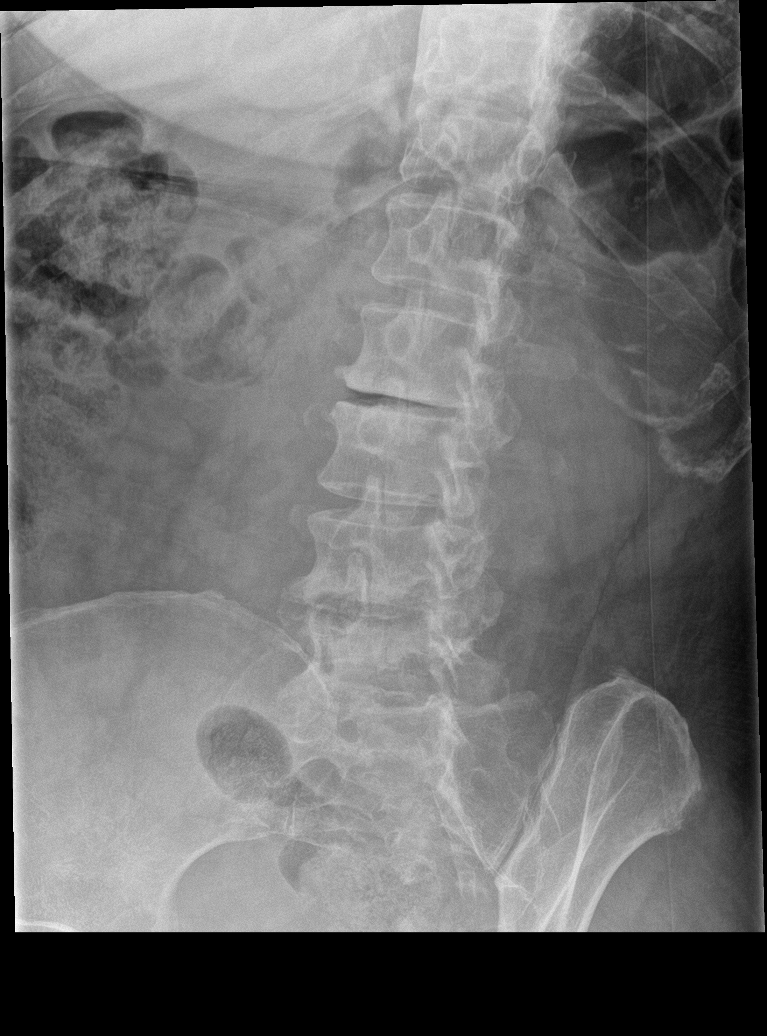

[l-spine obl (2 of 2)]
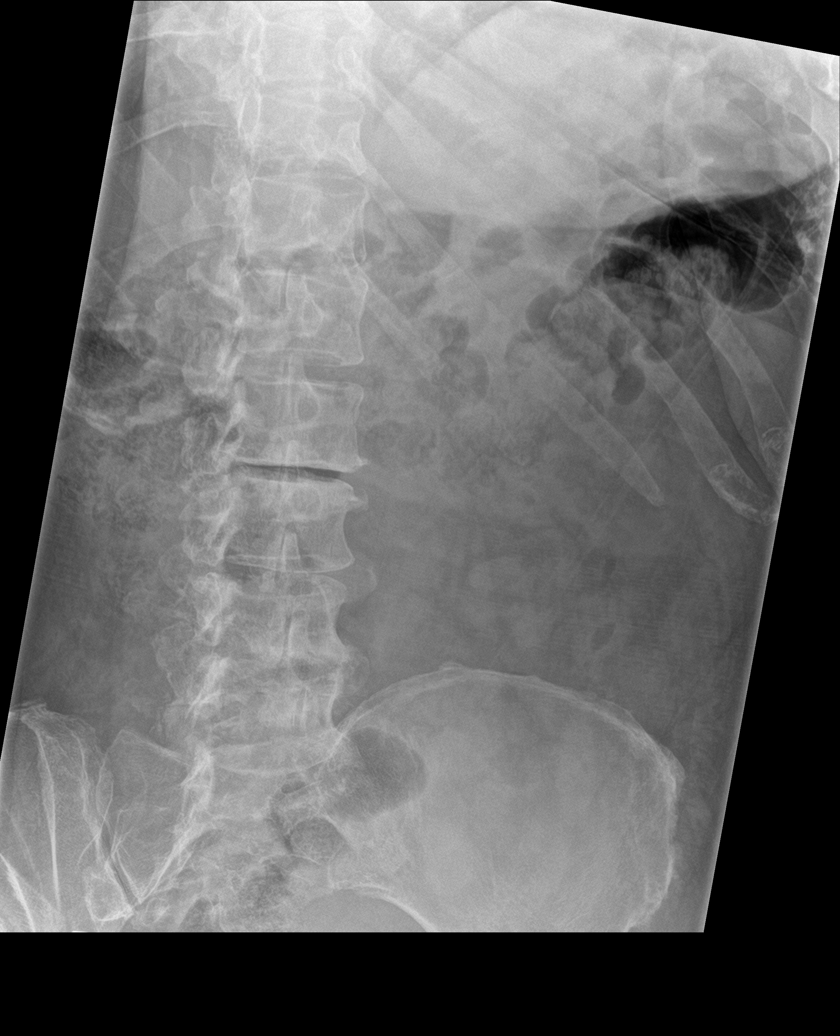

[l-spine lat]
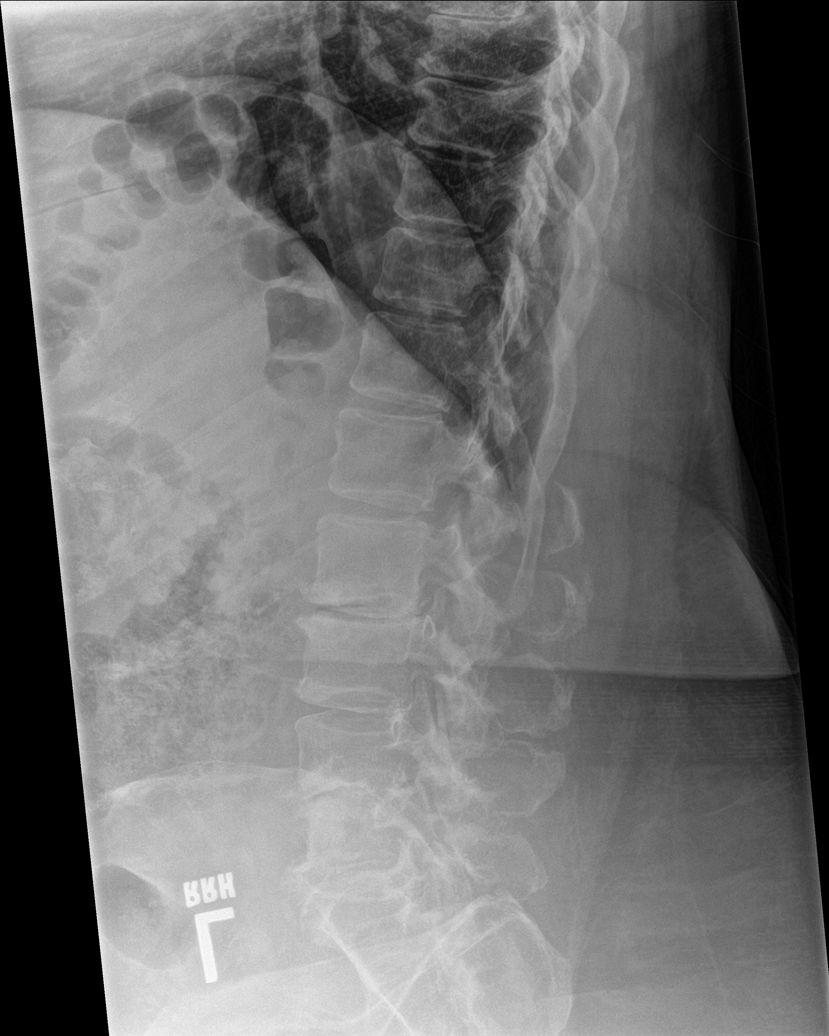

[l-spine spot]
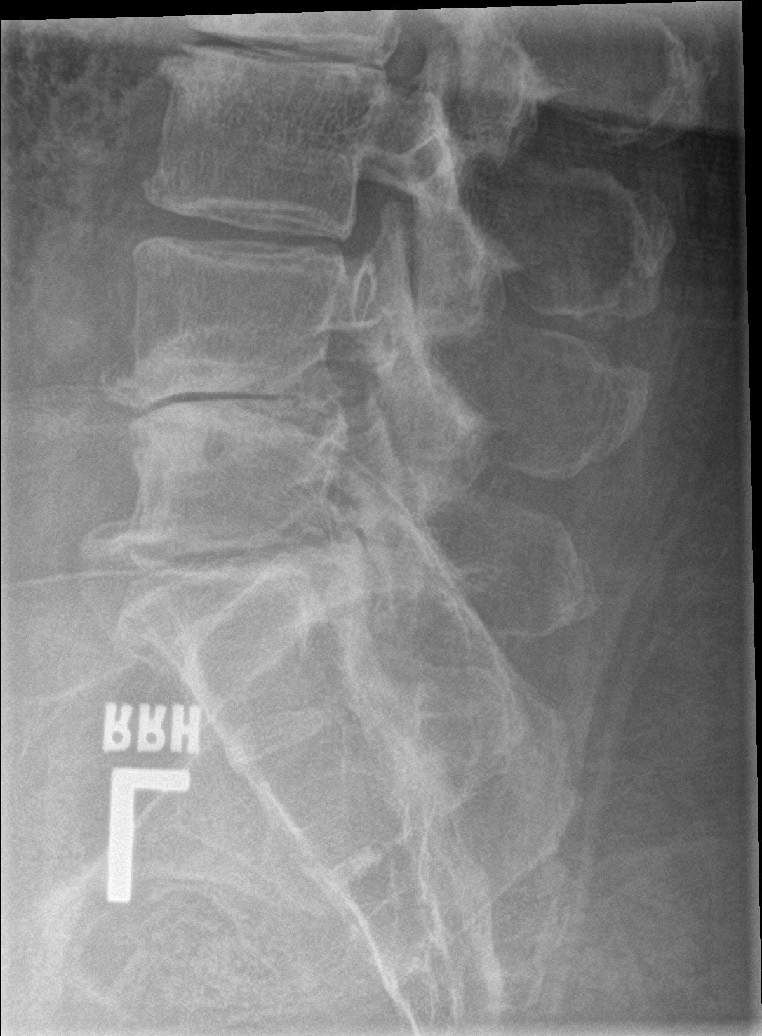

[5 of 5 positions shown; findings below may reference images not displayed]

FINDINGS: No fracture or significant spondylolisthesis is noted. Severe
degenerative disc disease is noted at L2-3, L4-5 and L5-S1 with
anterior osteophyte formation. Posterior facet joints are
unremarkable.
IMPRESSION: Severe multilevel degenerative disc disease. No acute abnormality
seen in the lumbar spine.

## 2021-04-21 ENCOUNTER — Other Ambulatory Visit: Payer: Self-pay | Admitting: Orthopaedic Surgery

## 2021-04-21 ENCOUNTER — Ambulatory Visit (INDEPENDENT_AMBULATORY_CARE_PROVIDER_SITE_OTHER): Payer: Medicare Other | Admitting: Orthopaedic Surgery

## 2021-04-21 ENCOUNTER — Encounter: Payer: Self-pay | Admitting: Orthopaedic Surgery

## 2021-04-21 VITALS — Ht 66.0 in

## 2021-04-21 DIAGNOSIS — M25561 Pain in right knee: Secondary | ICD-10-CM | POA: Diagnosis not present

## 2021-04-21 DIAGNOSIS — M25562 Pain in left knee: Secondary | ICD-10-CM

## 2021-04-21 DIAGNOSIS — G5603 Carpal tunnel syndrome, bilateral upper limbs: Secondary | ICD-10-CM

## 2021-04-21 DIAGNOSIS — Z6841 Body Mass Index (BMI) 40.0 and over, adult: Secondary | ICD-10-CM | POA: Diagnosis not present

## 2021-04-21 DIAGNOSIS — G8929 Other chronic pain: Secondary | ICD-10-CM

## 2021-04-21 NOTE — Addendum Note (Signed)
Addended by: Jodene Nam A on: 04/21/2021 02:15 PM   Modules accepted: Orders

## 2021-04-21 NOTE — Progress Notes (Signed)
Subjective:    Patient ID: Lauren Harmon, female    DOB: 28-Oct-1950, 70 y.o.   MRN: [id number]  HPI She has bilateral hand numbness, more at night, more on the left hand. She has numbness in the median nerve distribution.  She has not improved and is getting worse.  It hurts often in the day now. She has some pain running up to the shoulder at times. She has no trauma, no redness.  She is a resident for the past five years or so at [facility].     Review of Systems  Constitutional:  Positive for activity change.  Respiratory:  Positive for shortness of breath.   Cardiovascular:  Positive for palpitations and leg swelling.  Endocrine: Positive for cold intolerance and heat intolerance.  Musculoskeletal:  Positive for arthralgias, back pain, gait problem, joint swelling and myalgias.  Allergic/Immunologic: Positive for environmental allergies and food allergies.  Psychiatric/Behavioral:  The patient is nervous/anxious.   All other systems reviewed and are negative. For Review of Systems, all other systems reviewed and are negative.  The following is a summary of the past history medically, past history surgically, known current medicines, social history and family history.  This information is gathered electronically by the computer from prior information and documentation.  I review this each visit and have found including this information at this point in the chart is beneficial and informative.   Past Medical History:  Diagnosis Date   Allergy    Anxiety    Asthma    Depression    GERD (gastroesophageal reflux disease)    H/O: knee surgery    Hemorrhoid    Hypercholesteremia    Hypertension    Osteoarthritis    Osteopenia    Palpitations    Panic attack     Past Surgical History:  Procedure Laterality Date   BALLOON DILATION N/A 07/02/2014   Procedure: BALLOON DILATION;  Surgeon: Iva Boop, MD;  Location: [facility];  Service: Endoscopy;  Laterality: N/A;   BOTOX  INJECTION N/A 07/02/2014   Procedure: BOTOX INJECTION;  Surgeon: Iva Boop, MD;  Location: [facility];  Service: Endoscopy;  Laterality: N/A;   ESOPHAGOGASTRODUODENOSCOPY N/A 07/02/2014   Procedure: ESOPHAGOGASTRODUODENOSCOPY (EGD);  Surgeon: Iva Boop, MD;  Location: [facility];  Service: Endoscopy;  Laterality: N/A;  with possible dilation or botox   EYE SURGERY     KNEE SURGERY      Current Outpatient Medications on File Prior to Visit  Medication Sig Dispense Refill   amLODipine-benazepril (LOTREL) 5-10 MG capsule TAKE 1 CAPSULE BY MOUTH DAILY. 90 capsule 0   aspirin EC 81 MG tablet Take 81 mg by mouth every morning.      atorvastatin (LIPITOR) 40 MG tablet TAKE 1 TABLET (40 MG TOTAL) BY MOUTH DAILY. 90 tablet 0   cefUROXime (CEFTIN) 500 MG tablet Take 1 tablet (500 mg total) by mouth 2 (two) times daily with a meal. 4 tablet 0   Cholecalciferol (VITAMIN D) 2000 UNITS CAPS Take 1 capsule by mouth daily.     diazepam (VALIUM) 5 MG tablet Take 7.5 mg by mouth 3 (three) times daily.     diazepam (VALIUM) 5 MG tablet Take 1 tablet (5 mg total) by mouth 2 (two) times daily. 10 tablet 0   docusate sodium (COLACE) 100 MG capsule Take 200 mg by mouth daily.      esomeprazole (NEXIUM) 40 MG capsule Take 1 capsule (40 mg total) by mouth daily.  90 capsule 1   furosemide (LASIX) 20 MG tablet Take 1 tablet (20 mg total) by mouth every other day. 30 tablet 5   HYDROcodone-acetaminophen (NORCO) 7.5-325 MG per tablet Take 1 tablet by mouth every 4 (four) hours as needed for moderate pain.      HYDROcodone-acetaminophen (NORCO) 7.5-325 MG tablet Take 1 tablet by mouth every 4 (four) hours as needed for moderate pain. 10 tablet 0   meloxicam (MOBIC) 15 MG tablet Take 1 tablet (15 mg total) by mouth daily. 30 tablet 1   Menthol, Topical Analgesic, (FAST FREEZE PRO STYLE THERAPY EX) Apply 1 application topically 3 (three) times daily.     Menthol-Methyl Salicylate (MUSCLE RUB) 10-15 % CREA Apply  1 application topically as needed for muscle pain.     montelukast (SINGULAIR) 10 MG tablet TAKE 1 TABLET (10 MG TOTAL) BY MOUTH AT BEDTIME. 90 tablet 0   sertraline (ZOLOFT) 25 MG tablet Take 3 tablets (75 mg total) by mouth daily. 30 tablet 0   traZODone (DESYREL) 50 MG tablet Take 1 tablet (50 mg total) by mouth at bedtime. 30 tablet 0   No current facility-administered medications on file prior to visit.    Social History   Socioeconomic History   Marital status: Divorced    Spouse name: Not on file   Number of children: Not on file   Years of education: Not on file   Highest education level: Not on file  Occupational History   Not on file  Tobacco Use   Smoking status: Never   Smokeless tobacco: Never  Substance and Sexual Activity   Alcohol use: No   Drug use: No   Sexual activity: Never  Other Topics Concern   Not on file  Social History Narrative   Not on file   Social Determinants of Health   Financial Resource Strain: Not on file  Food Insecurity: Not on file  Transportation Needs: Not on file  Physical Activity: Not on file  Stress: Not on file  Social Connections: Not on file  Intimate Partner Violence: Not on file    Family History  Problem Relation Age of Onset   Osteoarthritis Mother    Hypertension Mother    Hypertension Father    Hyperlipidemia Father    Heart disease Father    Osteoarthritis Father    Lung cancer Maternal Uncle        smoker   Colon polyps Neg Hx    Colon cancer Neg Hx     Ht 5\' 6"  (1.676 m)   LMP 08/09/2002   BMI 44.55 kg/m   Body mass index is 44.55 kg/m.     Objective:   Physical Exam Vitals and nursing note reviewed. Exam conducted with a chaperone present.  Constitutional:      Appearance: She is well-developed.  HENT:     Head: Normocephalic and atraumatic.  Eyes:     Conjunctiva/sclera: Conjunctivae normal.     Pupils: Pupils are equal, round, and reactive to light.  Cardiovascular:     Rate and  Rhythm: Normal rate and regular rhythm.  Pulmonary:     Effort: Pulmonary effort is normal.  Abdominal:     Palpations: Abdomen is soft.  Musculoskeletal:       Hands:     Cervical back: Normal range of motion and neck supple.  Skin:    General: Skin is warm and dry.  Neurological:     Mental Status: She is alert and oriented to  person, place, and time.     Cranial Nerves: No cranial nerve deficit.     Motor: No abnormal muscle tone.     Coordination: Coordination normal.     Deep Tendon Reflexes: Reflexes are normal and symmetric. Reflexes normal.  Psychiatric:        Behavior: Behavior normal.        Thought Content: Thought content normal.        Judgment: Judgment normal.          Assessment & Plan:   Encounter Diagnoses  Name Primary?   Bilateral carpal tunnel syndrome Yes   Body mass index 45.0-49.9, adult (HCC)    Morbid obesity (HCC)    Bilateral chronic knee pain    I will get EMGs.  I will provide cock-up splint for the left hand.  I have explained possibility of outpatient carpal tunnel surgery.  She asked appropriate questions.  I have filled out forms for the nursing home.  Return in one month.  Call if any problem.  Precautions discussed.  Electronically Signed Darreld Mclean, MD 12/7/202210:14 AM

## 2021-05-12 ENCOUNTER — Telehealth: Payer: Self-pay | Admitting: Physical Medicine and Rehabilitation

## 2021-05-12 NOTE — Telephone Encounter (Signed)
Amy Freida Busman calling to follow up on a referral that was sent to Dr. Alvester Morin regarding an appointment for Lauren Harmon. Please follow up with Amy at [phone].

## 2021-05-19 ENCOUNTER — Ambulatory Visit: Payer: Medicaid Other | Admitting: Orthopaedic Surgery

## 2021-05-28 ENCOUNTER — Encounter: Payer: Self-pay | Admitting: Physical Medicine and Rehabilitation

## 2021-05-28 ENCOUNTER — Other Ambulatory Visit: Payer: Self-pay

## 2021-05-28 ENCOUNTER — Ambulatory Visit (INDEPENDENT_AMBULATORY_CARE_PROVIDER_SITE_OTHER): Payer: Medicare Other | Admitting: Physical Medicine and Rehabilitation

## 2021-05-28 DIAGNOSIS — R202 Paresthesia of skin: Secondary | ICD-10-CM

## 2021-05-28 NOTE — Progress Notes (Signed)
Pt state pain, tingling, burning and numbness in both hands and wrist. Pt state she feels pain in left hand ring, mid and pointer finger. Pt state in her right hand it her pointer and middle finger. Pt state she doesn't know what cause the pain but it hurt all the time . Pt state she is right handed.  Numeric Pain Rating Scale and Functional Assessment Average Pain 7   In the last MONTH (on 0-10 scale) has pain interfered with the following?  1. General activity like being  able to carry out your everyday physical activities such as walking, climbing stairs, carrying groceries, or moving a chair?  Rating(9)   +Driver

## 2021-06-01 NOTE — Procedures (Signed)
EMG & NCV Findings: Evaluation of the left median motor nerve showed prolonged distal onset latency (6.8 ms), reduced amplitude (3.8 mV), and decreased conduction velocity (Elbow-Wrist, 40 m/s).  The right median motor nerve showed prolonged distal onset latency (4.3 ms).  The left median (across palm) sensory nerve showed no response (Palm) and prolonged distal peak latency (5.4 ms).  The right median (across palm) sensory nerve showed prolonged distal peak latency (Wrist, 4.7 ms) and prolonged distal peak latency (Palm, 2.8 ms).  The left ulnar sensory and the right ulnar sensory nerves showed prolonged distal peak latency (L4.5, R4.3 ms) and decreased conduction velocity (Wrist-5th Digit, L31, R33 m/s).  All remaining nerves (as indicated in the following tables) were within normal limits.  Left vs. Right side comparison data for the median motor nerve indicates abnormal L-R latency difference (2.5 ms) and abnormal L-R velocity difference (Elbow-Wrist, 11 m/s).  All remaining left vs. right side differences were within normal limits.    All examined muscles (as indicated in the following table) showed no evidence of electrical instability.    Impression: The above electrodiagnostic study is ABNORMAL and reveals evidence of a moderate to severe left median nerve entrapment at the wrist (carpal tunnel syndrome) affecting sensory and motor components.    There is also evidence of a mild right median nerve entrapment at the wrist (carpal tunnel syndrome) affecting sensory components.    There is no significant electrodiagnostic evidence of any other focal nerve entrapment, brachial plexopathy or cervical radiculopathy in either upper limb.    * The mild symmetric slowing of the ulnar sensory nerve action potential is likely temperature and technical artifact.  Recommendations: 1.  Follow-up with referring physician. 2.  Continue current management of symptoms. 3.  Continue use of resting splint at  night-time and as needed during the day. 4.  Suggest surgical evaluation.  ___________________________ Naaman Plummer FAAPMR Board Certified, American Board of Physical Medicine and Rehabilitation    Nerve Conduction Studies Anti Sensory Summary Table   Stim Site NR Peak (ms) Norm Peak (ms) P-T Amp (V) Norm P-T Amp Site1 Site2 Delta-P (ms) Dist (cm) Vel (m/s) Norm Vel (m/s)  Left Median Acr Palm Anti Sensory (2nd Digit)  29.1C  Wrist    *5.4 <3.6 14.3 >10 Wrist Palm  0.0    Palm *NR  <2.0          Right Median Acr Palm Anti Sensory (2nd Digit)  28.8C  Wrist    *4.7 <3.6 18.6 >10 Wrist Palm 1.9 0.0    Palm    *2.8 <2.0 9.8         Left Radial Anti Sensory (Base 1st Digit)  28.9C  Wrist    2.3 <3.1 31.9  Wrist Base 1st Digit 2.3 0.0    Right Radial Anti Sensory (Base 1st Digit)  29.2C  Wrist    2.4 <3.1 15.9  Wrist Base 1st Digit 2.4 0.0    Left Ulnar Anti Sensory (5th Digit)  29.1C  Wrist    *4.5 <3.7 19.8 >15.0 Wrist 5th Digit 4.5 14.0 *31 >38  Right Ulnar Anti Sensory (5th Digit)  28.9C  Wrist    *4.3 <3.7 15.8 >15.0 Wrist 5th Digit 4.3 14.0 *33 >38   Motor Summary Table   Stim Site NR Onset (ms) Norm Onset (ms) O-P Amp (mV) Norm O-P Amp Site1 Site2 Delta-0 (ms) Dist (cm) Vel (m/s) Norm Vel (m/s)  Left Median Motor (Abd Poll Brev)  28.8C  Wrist    *  6.8 <4.2 *3.8 >5 Elbow Wrist 5.0 20.0 *40 >50  Elbow    11.8  3.1         Right Median Motor (Abd Poll Brev)  29C  Wrist    *4.3 <4.2 6.3 >5 Elbow Wrist 4.1 21.0 51 >50  Elbow    8.4  6.0         Left Ulnar Motor (Abd Dig Min)  28.7C  Wrist    4.1 <4.2 10.0 >3 B Elbow Wrist 3.6 20.0 56 >53  B Elbow    7.7  7.8  A Elbow B Elbow 1.6 10.0 62 >53  A Elbow    9.3  9.3         Right Ulnar Motor (Abd Dig Min)  28.9C  Wrist    4.0 <4.2 7.5 >3 B Elbow Wrist 3.6 20.0 56 >53  B Elbow    7.6  7.0  A Elbow B Elbow 1.9 10.0 53 >53  A Elbow    9.5  5.8          EMG   Side Muscle Nerve Root Ins Act Fibs Psw Amp Dur Poly Recrt Int  Dennie Bible Comment  Left Abd Poll Brev Median C8-T1 Nml Nml Nml Nml Nml 0 Nml Nml   Left 1stDorInt Ulnar C8-T1 Nml Nml Nml Nml Nml 0 Nml Nml     Nerve Conduction Studies Anti Sensory Left/Right Comparison   Stim Site L Lat (ms) R Lat (ms) L-R Lat (ms) L Amp (V) R Amp (V) L-R Amp (%) Site1 Site2 L Vel (m/s) R Vel (m/s) L-R Vel (m/s)  Median Acr Palm Anti Sensory (2nd Digit)  29.1C  Wrist *5.4 *4.7 0.7 14.3 18.6 23.1 Wrist Palm     Palm  *2.8   9.8        Radial Anti Sensory (Base 1st Digit)  28.9C  Wrist 2.3 2.4 0.1 31.9 15.9 50.2 Wrist Base 1st Digit     Ulnar Anti Sensory (5th Digit)  29.1C  Wrist *4.5 *4.3 0.2 19.8 15.8 20.2 Wrist 5th Digit *31 *33 2   Motor Left/Right Comparison   Stim Site L Lat (ms) R Lat (ms) L-R Lat (ms) L Amp (mV) R Amp (mV) L-R Amp (%) Site1 Site2 L Vel (m/s) R Vel (m/s) L-R Vel (m/s)  Median Motor (Abd Poll Brev)  28.8C  Wrist *6.8 *4.3 *2.5 *3.8 6.3 39.7 Elbow Wrist *40 51 *11  Elbow 11.8 8.4 3.4 3.1 6.0 48.3       Ulnar Motor (Abd Dig Min)  28.7C  Wrist 4.1 4.0 0.1 10.0 7.5 25.0 B Elbow Wrist 56 56 0  B Elbow 7.7 7.6 0.1 7.8 7.0 10.3 A Elbow B Elbow 62 53 9  A Elbow 9.3 9.5 0.2 9.3 5.8 37.6          Waveforms:

## 2021-06-01 NOTE — Progress Notes (Addendum)
Lauren Harmon - 71 y.o. female MRN [id number]  Date of birth: 06/28/1950  Office Visit Note: Visit Date: 05/28/2021 PCP: Timmie Foerster, MD   Subjective: Chief Complaint  Patient presents with   Right Wrist - Pain, Numbness, Tingling, Burn   Left Wrist - Numbness, Pain, Tingling, Burn   Right Hand - Pain, Numbness, Tingling, Burn   Left Hand - Pain, Numbness, Tingling, Burn   HPI:  Lauren Harmon is a 71 y.o. female who comes in today at the request of Dr. Darreld Mclean for electrodiagnostic study of the Bilateral upper extremities.  Patient is Right hand dominant.  She reports chronic severe 7 out of 10 pain with burning tingling and numbness in both hands and wrist in a nondermatomal fashion.  When further questioned she gets more symptoms in the radial digits.  She states that the left is more of a problem than the right.  She uses hydrocodone for pain relief.  She is wheelchair dependent and morbidly obese but nondiabetic.  She has no prior electrodiagnostic studies.  No radicular complaints.  ROS Otherwise per HPI.  Assessment & Plan: Visit Diagnoses:    ICD-10-CM   1. 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    There is also evidence of a mild right median nerve entrapment at the wrist (carpal tunnel syndrome) affecting sensory components.    There is no significant electrodiagnostic evidence of any other focal nerve entrapment, brachial plexopathy or cervical radiculopathy in either upper limb.   * The mild symmetric slowing of the ulnar sensory nerve action potential is likely temperature and technical artifact.  Recommendations: 1.  Follow-up with referring physician. 2.  Continue current management of symptoms. 3.  Continue  use of resting splint at night-time and as needed during the day. 4.  Suggest surgical evaluation.  Meds & Orders: No orders of the defined types were placed in this encounter.   Orders Placed This Encounter  Procedures   NCV with EMG (electromyography)    Follow-up: Return for Darreld Mclean, MD.   Procedures: No procedures performed  EMG & NCV Findings: Evaluation of the left median motor nerve showed prolonged distal onset latency (6.8 ms), reduced amplitude (3.8 mV), and decreased conduction velocity (Elbow-Wrist, 40 m/s).  The right median motor nerve showed prolonged distal onset latency (4.3 ms).  The left median (across palm) sensory nerve showed no response (Palm) and prolonged distal peak latency (5.4 ms).  The right median (across palm) sensory nerve showed prolonged distal peak latency (Wrist, 4.7 ms) and prolonged distal peak latency (Palm, 2.8 ms).  The left ulnar sensory and the right ulnar sensory nerves showed prolonged distal peak latency (L4.5, R4.3 ms) and decreased conduction velocity (Wrist-5th Digit, L31, R33 m/s).  All remaining nerves (as indicated in the following tables) were within normal limits.  Left vs. Right side comparison data for the median motor nerve indicates abnormal L-R latency difference (2.5 ms) and abnormal L-R velocity difference (Elbow-Wrist, 11 m/s).  All remaining left vs. right side differences were within normal limits.    All examined muscles (as indicated in the following table) showed no evidence of electrical instability.    Impression: The above electrodiagnostic study is ABNORMAL and reveals evidence of a moderate to severe  left median nerve entrapment at the wrist (carpal tunnel syndrome) affecting sensory and motor components.    There is also evidence of a mild right median nerve entrapment at the wrist (carpal tunnel syndrome) affecting sensory components.    There is no significant electrodiagnostic evidence of any other focal nerve  entrapment, brachial plexopathy or cervical radiculopathy in either upper limb.    * The mild symmetric slowing of the ulnar sensory nerve action potential is likely temperature and technical artifact.  Recommendations: 1.  Follow-up with referring physician. 2.  Continue current management of symptoms. 3.  Continue use of resting splint at night-time and as needed during the day. 4.  Suggest surgical evaluation.  ___________________________ Naaman PlummerFred Alya Smaltz FAAPMR Board Certified, American Board of Physical Medicine and Rehabilitation    Nerve Conduction Studies Anti Sensory Summary Table   Stim Site NR Peak (ms) Norm Peak (ms) P-T Amp (V) Norm P-T Amp Site1 Site2 Delta-P (ms) Dist (cm) Vel (m/s) Norm Vel (m/s)  Left Median Acr Palm Anti Sensory (2nd Digit)  29.1C  Wrist    *5.4 <3.6 14.3 >10 Wrist Palm  0.0    Palm *NR  <2.0          Right Median Acr Palm Anti Sensory (2nd Digit)  28.8C  Wrist    *4.7 <3.6 18.6 >10 Wrist Palm 1.9 0.0    Palm    *2.8 <2.0 9.8         Left Radial Anti Sensory (Base 1st Digit)  28.9C  Wrist    2.3 <3.1 31.9  Wrist Base 1st Digit 2.3 0.0    Right Radial Anti Sensory (Base 1st Digit)  29.2C  Wrist    2.4 <3.1 15.9  Wrist Base 1st Digit 2.4 0.0    Left Ulnar Anti Sensory (5th Digit)  29.1C  Wrist    *4.5 <3.7 19.8 >15.0 Wrist 5th Digit 4.5 14.0 *31 >38  Right Ulnar Anti Sensory (5th Digit)  28.9C  Wrist    *4.3 <3.7 15.8 >15.0 Wrist 5th Digit 4.3 14.0 *33 >38   Motor Summary Table   Stim Site NR Onset (ms) Norm Onset (ms) O-P Amp (mV) Norm O-P Amp Site1 Site2 Delta-0 (ms) Dist (cm) Vel (m/s) Norm Vel (m/s)  Left Median Motor (Abd Poll Brev)  28.8C  Wrist    *6.8 <4.2 *3.8 >5 Elbow Wrist 5.0 20.0 *40 >50  Elbow    11.8  3.1         Right Median Motor (Abd Poll Brev)  29C  Wrist    *4.3 <4.2 6.3 >5 Elbow Wrist 4.1 21.0 51 >50  Elbow    8.4  6.0         Left Ulnar Motor (Abd Dig Min)  28.7C  Wrist    4.1 <4.2 10.0 >3 B Elbow Wrist 3.6 20.0  56 >53  B Elbow    7.7  7.8  A Elbow B Elbow 1.6 10.0 62 >53  A Elbow    9.3  9.3         Right Ulnar Motor (Abd Dig Min)  28.9C  Wrist    4.0 <4.2 7.5 >3 B Elbow Wrist 3.6 20.0 56 >53  B Elbow    7.6  7.0  A Elbow B Elbow 1.9 10.0 53 >53  A Elbow    9.5  5.8          EMG   Side Muscle Nerve Root Ins Act Fibs Psw Amp Dur Poly Recrt  Int Dennie Bible Comment  Left Abd Poll Brev Median C8-T1 Nml Nml Nml Nml Nml 0 Nml Nml   Left 1stDorInt Ulnar C8-T1 Nml Nml Nml Nml Nml 0 Nml Nml     Nerve Conduction Studies Anti Sensory Left/Right Comparison   Stim Site L Lat (ms) R Lat (ms) L-R Lat (ms) L Amp (V) R Amp (V) L-R Amp (%) Site1 Site2 L Vel (m/s) R Vel (m/s) L-R Vel (m/s)  Median Acr Palm Anti Sensory (2nd Digit)  29.1C  Wrist *5.4 *4.7 0.7 14.3 18.6 23.1 Wrist Palm     Palm  *2.8   9.8        Radial Anti Sensory (Base 1st Digit)  28.9C  Wrist 2.3 2.4 0.1 31.9 15.9 50.2 Wrist Base 1st Digit     Ulnar Anti Sensory (5th Digit)  29.1C  Wrist *4.5 *4.3 0.2 19.8 15.8 20.2 Wrist 5th Digit *31 *33 2   Motor Left/Right Comparison   Stim Site L Lat (ms) R Lat (ms) L-R Lat (ms) L Amp (mV) R Amp (mV) L-R Amp (%) Site1 Site2 L Vel (m/s) R Vel (m/s) L-R Vel (m/s)  Median Motor (Abd Poll Brev)  28.8C  Wrist *6.8 *4.3 *2.5 *3.8 6.3 39.7 Elbow Wrist *40 51 *11  Elbow 11.8 8.4 3.4 3.1 6.0 48.3       Ulnar Motor (Abd Dig Min)  28.7C  Wrist 4.1 4.0 0.1 10.0 7.5 25.0 B Elbow Wrist 56 56 0  B Elbow 7.7 7.6 0.1 7.8 7.0 10.3 A Elbow B Elbow 62 53 9  A Elbow 9.3 9.5 0.2 9.3 5.8 37.6          Waveforms:                      Clinical History: No specialty comments available.     Objective:  VS:  HT:     WT:    BMI:      BP:    HR: bpm   TEMP: ( )   RESP:  Physical Exam Musculoskeletal:        General: No swelling, tenderness or deformity.     Comments: Inspection reveals no atrophy of the bilateral APB or FDI or hand intrinsics. There is no swelling, color changes, allodynia or dystrophic  changes. There is 5 out of 5 strength in the bilateral wrist extension, finger abduction and long finger flexion. There is intact sensation to light touch in all dermatomal and peripheral nerve distributions. There is a negative Hoffmann's test bilaterally.  Skin:    General: Skin is warm and dry.     Findings: No erythema or rash.  Neurological:     General: No focal deficit present.     Mental Status: She is alert and oriented to person, place, and time.     Motor: No weakness or abnormal muscle tone.     Coordination: Coordination normal.  Psychiatric:        Mood and Affect: Mood normal.        Behavior: Behavior normal.     Imaging: No results found.

## 2021-06-02 ENCOUNTER — Encounter: Payer: Self-pay | Admitting: Orthopaedic Surgery

## 2021-06-02 ENCOUNTER — Ambulatory Visit (INDEPENDENT_AMBULATORY_CARE_PROVIDER_SITE_OTHER): Payer: Medicare Other | Admitting: Orthopaedic Surgery

## 2021-06-02 ENCOUNTER — Other Ambulatory Visit: Payer: Self-pay

## 2021-06-02 VITALS — Ht 66.0 in

## 2021-06-02 DIAGNOSIS — G5603 Carpal tunnel syndrome, bilateral upper limbs: Secondary | ICD-10-CM

## 2021-06-02 DIAGNOSIS — Z6841 Body Mass Index (BMI) 40.0 and over, adult: Secondary | ICD-10-CM | POA: Diagnosis not present

## 2021-06-02 NOTE — Progress Notes (Signed)
I saw Dr. Alvester Morin Monday.  She had her EMGs done Monday and it showed severe carpal tunnel on the left and moderate carpal tunnel on the right.   I have explained the findings to her.  She will need outpatient surgery.  She has bilateral decreased median nerve sensation in both hands, positive Phalen bilaterally and positive Tinel on the left.  Pulses intact.  Encounter Diagnoses  Name Primary?   Bilateral carpal tunnel syndrome Yes   Body mass index 45.0-49.9, adult (HCC)    Morbid obesity (HCC)    For Carpal tunnel surgery.  To see Dr. Dallas Schimke or Dr. Romeo Apple in the other office.  Call if any problem.  Precautions discussed.  Forms for nursing home completed.  Electronically Signed Darreld Mclean, MD 1/18/20239:39 AM

## 2021-06-15 ENCOUNTER — Other Ambulatory Visit: Payer: Self-pay

## 2021-06-15 ENCOUNTER — Encounter: Payer: Self-pay | Admitting: Orthopedic Surgery

## 2021-06-15 ENCOUNTER — Ambulatory Visit (INDEPENDENT_AMBULATORY_CARE_PROVIDER_SITE_OTHER): Payer: Medicare Other | Admitting: Orthopedic Surgery

## 2021-06-15 VITALS — BP 133/75 | HR 73 | Ht 66.0 in | Wt 326.0 lb

## 2021-06-15 DIAGNOSIS — G5603 Carpal tunnel syndrome, bilateral upper limbs: Secondary | ICD-10-CM

## 2021-06-16 ENCOUNTER — Encounter: Payer: Self-pay | Admitting: Orthopedic Surgery

## 2021-06-16 NOTE — H&P (View-Only) (Signed)
New Patient Visit  Assessment: Lauren Harmon is a 71 y.o. female with the following: Left carpal tunnel syndrome  Plan: Patient has pain, numbness and tingling consistent with carpal tunnel syndrome.  EMG demonstrates severe carpal tunnel syndrome on the left.  She also has moderate carpal tunnel syndrome on the right.  Based on the severity of her pain, and associated symptoms, she is interested in surgery.  Open carpal tunnel release was discussed in clinic today.  All questions were answered.  Risks and benefits were discussed.  She is interested in proceeding.  I have asked her to obtain medical clearance prior to scheduling surgery.  Once we have medical clearance, we can work towards determining a date.   Follow-up: Return for After medical clearance for OR.  Subjective:  Chief Complaint  Patient presents with   New Patient (Initial Visit)    CTS LT>RT    History of Present Illness: Lauren Harmon is a 71 y.o. female who presents for evaluation of bilateral hand pain, numbness and tingling.  She reports progressively worsening symptoms in bilateral hands.  She has tried bracing at night.  Her symptoms have not improved.  The pain wakes her up at night.  She currently has some numbness and tingling in her fingers, left worse than right.  Recent EMG findings demonstrate severe carpal tunnel on the left, and moderate carpal tunnel on the right   Review of Systems: No fevers or chills Numbness and tingling No chest pain No shortness of breath No bowel or bladder dysfunction No GI distress No headaches   Medical History:  Past Medical History:  Diagnosis Date   Allergy    Anxiety    Asthma    Depression    GERD (gastroesophageal reflux disease)    H/O: knee surgery    Hemorrhoid    Hypercholesteremia    Hypertension    Osteoarthritis    Osteopenia    Palpitations    Panic attack     Past Surgical History:  Procedure Laterality Date   BALLOON  DILATION N/A 07/02/2014   Procedure: BALLOON DILATION;  Surgeon: Gatha Mayer, MD;  Location: [facility];  Service: Endoscopy;  Laterality: N/A;   BOTOX INJECTION N/A 07/02/2014   Procedure: BOTOX INJECTION;  Surgeon: Gatha Mayer, MD;  Location: [facility];  Service: Endoscopy;  Laterality: N/A;   ESOPHAGOGASTRODUODENOSCOPY N/A 07/02/2014   Procedure: ESOPHAGOGASTRODUODENOSCOPY (EGD);  Surgeon: Gatha Mayer, MD;  Location: [facility];  Service: Endoscopy;  Laterality: N/A;  with possible dilation or botox   EYE SURGERY     KNEE SURGERY      Family History  Problem Relation Age of Onset   Osteoarthritis Mother    Hypertension Mother    Hypertension Father    Hyperlipidemia Father    Heart disease Father    Osteoarthritis Father    Lung cancer Maternal Uncle        smoker   Colon polyps Neg Hx    Colon cancer Neg Hx    Social History   Tobacco Use   Smoking status: Never   Smokeless tobacco: Never  Substance Use Topics   Alcohol use: No   Drug use: No    Allergies  Allergen Reactions   Wellbutrin [Bupropion Hcl] Nausea Only    Current Meds  Medication Sig   amLODipine-benazepril (LOTREL) 5-10 MG capsule TAKE 1 CAPSULE BY MOUTH DAILY.   aspirin EC 81 MG tablet Take 81 mg by mouth every morning.  atorvastatin (LIPITOR) 40 MG tablet TAKE 1 TABLET (40 MG TOTAL) BY MOUTH DAILY.   cefUROXime (CEFTIN) 500 MG tablet Take 1 tablet (500 mg total) by mouth 2 (two) times daily with a meal.   Cholecalciferol (VITAMIN D) 2000 UNITS CAPS Take 1 capsule by mouth daily.   clonazePAM (KLONOPIN) 1 MG tablet Take 1 mg by mouth 2 (two) times daily as needed.   docusate sodium (COLACE) 100 MG capsule Take 200 mg by mouth daily.   esomeprazole (NEXIUM) 40 MG capsule Take 1 capsule (40 mg total) by mouth daily.   famotidine (PEPCID) 20 MG tablet Take 20 mg by mouth daily.   ferrous sulfate 324 MG TBEC Take 324 mg by mouth.   furosemide (LASIX) 20 MG tablet Take 1 tablet (20 mg  total) by mouth every other day.   gabapentin (NEURONTIN) 100 MG capsule Take 200 mg by mouth 3 (three) times daily.   HYDROcodone-acetaminophen (NORCO) 7.5-325 MG per tablet Take 1 tablet by mouth every 4 (four) hours as needed for moderate pain.    HYDROcodone-acetaminophen (NORCO) 7.5-325 MG tablet Take 1 tablet by mouth every 4 (four) hours as needed for moderate pain.   meloxicam (MOBIC) 15 MG tablet Take 1 tablet (15 mg total) by mouth daily.   Menthol, Topical Analgesic, (FAST FREEZE PRO STYLE THERAPY EX) Apply 1 application topically 3 (three) times daily.   Menthol-Methyl Salicylate (MUSCLE RUB) 10-15 % CREA Apply 1 application topically as needed for muscle pain.   montelukast (SINGULAIR) 10 MG tablet TAKE 1 TABLET (10 MG TOTAL) BY MOUTH AT BEDTIME.   sertraline (ZOLOFT) 25 MG tablet Take 3 tablets (75 mg total) by mouth daily.   traZODone (DESYREL) 50 MG tablet Take 1 tablet (50 mg total) by mouth at bedtime.   venlafaxine XR (EFFEXOR-XR) 75 MG 24 hr capsule Take 75 mg by mouth daily with breakfast.   [DISCONTINUED] diazepam (VALIUM) 5 MG tablet Take 1 tablet (5 mg total) by mouth 2 (two) times daily.    Objective: BP 133/75    Pulse 73    Ht 5\' 6"  (1.676 m)    Wt (!) 326 lb (147.9 kg)    LMP 08/09/2002    BMI 52.62 kg/m   Physical Exam:  General: Alert and oriented., No acute distress., Seated in a wheelchair., and Obese female. Gait: Unable to ambulate.  Evaluation left hand demonstrates no deformity.  Mild loss of musculature within the thenar eminence.  Positive Tinel's at the carpal tunnel.  Positive Phalen's.  Decreased sensation within the median nerve distribution.  Full sensation to the small finger.  2+ radial pulse.  Fingers warm and well-perfused.  Evaluation of the right hand demonstrates no deformity.  Normal muscle within the thenar eminence.  Full and painless range of motion.  Positive Tinel's at the carpal tunnel.  Positive Phalen's.  Positive carpal tunnel  compression test.  IMAGING: No new imaging obtained today   New Medications:  No orders of the defined types were placed in this encounter.     Mordecai Rasmussen, MD  06/16/2021 8:14 AM

## 2021-06-16 NOTE — Progress Notes (Signed)
New Patient Visit  Assessment: Lauren Harmon is a 71 y.o. female with the following: Left carpal tunnel syndrome  Plan: Patient has pain, numbness and tingling consistent with carpal tunnel syndrome.  EMG demonstrates severe carpal tunnel syndrome on the left.  She also has moderate carpal tunnel syndrome on the right.  Based on the severity of her pain, and associated symptoms, she is interested in surgery.  Open carpal tunnel release was discussed in clinic today.  All questions were answered.  Risks and benefits were discussed.  She is interested in proceeding.  I have asked her to obtain medical clearance prior to scheduling surgery.  Once we have medical clearance, we can work towards determining a date.   Follow-up: Return for After medical clearance for OR.  Subjective:  Chief Complaint  Patient presents with   New Patient (Initial Visit)    CTS LT>RT    History of Present Illness: Lauren Harmon is a 71 y.o. female who presents for evaluation of bilateral hand pain, numbness and tingling.  She reports progressively worsening symptoms in bilateral hands.  She has tried bracing at night.  Her symptoms have not improved.  The pain wakes her up at night.  She currently has some numbness and tingling in her fingers, left worse than right.  Recent EMG findings demonstrate severe carpal tunnel on the left, and moderate carpal tunnel on the right   Review of Systems: No fevers or chills Numbness and tingling No chest pain No shortness of breath No bowel or bladder dysfunction No GI distress No headaches   Medical History:  Past Medical History:  Diagnosis Date   Allergy    Anxiety    Asthma    Depression    GERD (gastroesophageal reflux disease)    H/O: knee surgery    Hemorrhoid    Hypercholesteremia    Hypertension    Osteoarthritis    Osteopenia    Palpitations    Panic attack     Past Surgical History:  Procedure Laterality Date   BALLOON  DILATION N/A 07/02/2014   Procedure: BALLOON DILATION;  Surgeon: Gatha Mayer, MD;  Location: [facility];  Service: Endoscopy;  Laterality: N/A;   BOTOX INJECTION N/A 07/02/2014   Procedure: BOTOX INJECTION;  Surgeon: Gatha Mayer, MD;  Location: [facility];  Service: Endoscopy;  Laterality: N/A;   ESOPHAGOGASTRODUODENOSCOPY N/A 07/02/2014   Procedure: ESOPHAGOGASTRODUODENOSCOPY (EGD);  Surgeon: Gatha Mayer, MD;  Location: [facility];  Service: Endoscopy;  Laterality: N/A;  with possible dilation or botox   EYE SURGERY     KNEE SURGERY      Family History  Problem Relation Age of Onset   Osteoarthritis Mother    Hypertension Mother    Hypertension Father    Hyperlipidemia Father    Heart disease Father    Osteoarthritis Father    Lung cancer Maternal Uncle        smoker   Colon polyps Neg Hx    Colon cancer Neg Hx    Social History   Tobacco Use   Smoking status: Never   Smokeless tobacco: Never  Substance Use Topics   Alcohol use: No   Drug use: No    Allergies  Allergen Reactions   Wellbutrin [Bupropion Hcl] Nausea Only    Current Meds  Medication Sig   amLODipine-benazepril (LOTREL) 5-10 MG capsule TAKE 1 CAPSULE BY MOUTH DAILY.   aspirin EC 81 MG tablet Take 81 mg by mouth every morning.  atorvastatin (LIPITOR) 40 MG tablet TAKE 1 TABLET (40 MG TOTAL) BY MOUTH DAILY.   cefUROXime (CEFTIN) 500 MG tablet Take 1 tablet (500 mg total) by mouth 2 (two) times daily with a meal.   Cholecalciferol (VITAMIN D) 2000 UNITS CAPS Take 1 capsule by mouth daily.   clonazePAM (KLONOPIN) 1 MG tablet Take 1 mg by mouth 2 (two) times daily as needed.   docusate sodium (COLACE) 100 MG capsule Take 200 mg by mouth daily.   esomeprazole (NEXIUM) 40 MG capsule Take 1 capsule (40 mg total) by mouth daily.   famotidine (PEPCID) 20 MG tablet Take 20 mg by mouth daily.   ferrous sulfate 324 MG TBEC Take 324 mg by mouth.   furosemide (LASIX) 20 MG tablet Take 1 tablet (20 mg  total) by mouth every other day.   gabapentin (NEURONTIN) 100 MG capsule Take 200 mg by mouth 3 (three) times daily.   HYDROcodone-acetaminophen (NORCO) 7.5-325 MG per tablet Take 1 tablet by mouth every 4 (four) hours as needed for moderate pain.    HYDROcodone-acetaminophen (NORCO) 7.5-325 MG tablet Take 1 tablet by mouth every 4 (four) hours as needed for moderate pain.   meloxicam (MOBIC) 15 MG tablet Take 1 tablet (15 mg total) by mouth daily.   Menthol, Topical Analgesic, (FAST FREEZE PRO STYLE THERAPY EX) Apply 1 application topically 3 (three) times daily.   Menthol-Methyl Salicylate (MUSCLE RUB) 10-15 % CREA Apply 1 application topically as needed for muscle pain.   montelukast (SINGULAIR) 10 MG tablet TAKE 1 TABLET (10 MG TOTAL) BY MOUTH AT BEDTIME.   sertraline (ZOLOFT) 25 MG tablet Take 3 tablets (75 mg total) by mouth daily.   traZODone (DESYREL) 50 MG tablet Take 1 tablet (50 mg total) by mouth at bedtime.   venlafaxine XR (EFFEXOR-XR) 75 MG 24 hr capsule Take 75 mg by mouth daily with breakfast.   [DISCONTINUED] diazepam (VALIUM) 5 MG tablet Take 1 tablet (5 mg total) by mouth 2 (two) times daily.    Objective: BP 133/75    Pulse 73    Ht 5\' 6"  (1.676 m)    Wt (!) 326 lb (147.9 kg)    LMP 08/09/2002    BMI 52.62 kg/m   Physical Exam:  General: Alert and oriented., No acute distress., Seated in a wheelchair., and Obese female. Gait: Unable to ambulate.  Evaluation left hand demonstrates no deformity.  Mild loss of musculature within the thenar eminence.  Positive Tinel's at the carpal tunnel.  Positive Phalen's.  Decreased sensation within the median nerve distribution.  Full sensation to the small finger.  2+ radial pulse.  Fingers warm and well-perfused.  Evaluation of the right hand demonstrates no deformity.  Normal muscle within the thenar eminence.  Full and painless range of motion.  Positive Tinel's at the carpal tunnel.  Positive Phalen's.  Positive carpal tunnel  compression test.  IMAGING: No new imaging obtained today   New Medications:  No orders of the defined types were placed in this encounter.     Mordecai Rasmussen, MD  06/16/2021 8:14 AM

## 2021-06-24 ENCOUNTER — Telehealth: Payer: Self-pay | Admitting: Orthopedic Surgery

## 2021-06-24 ENCOUNTER — Other Ambulatory Visit: Payer: Self-pay

## 2021-06-24 DIAGNOSIS — G5603 Carpal tunnel syndrome, bilateral upper limbs: Secondary | ICD-10-CM

## 2021-06-24 NOTE — Telephone Encounter (Signed)
Call via voice message received from  Amy at [facility], ph#[phone],  asking about surgery appointment?

## 2021-06-28 ENCOUNTER — Encounter (HOSPITAL_COMMUNITY): Payer: Self-pay

## 2021-06-28 ENCOUNTER — Encounter (HOSPITAL_COMMUNITY)
Admission: RE | Admit: 2021-06-28 | Discharge: 2021-06-28 | Disposition: A | Discharge status: Home / Self Care | Payer: Medicare Other | Source: Ambulatory Visit | Attending: Orthopedic Surgery | Admitting: Orthopedic Surgery

## 2021-06-28 DIAGNOSIS — Z01818 Encounter for other preprocedural examination: Secondary | ICD-10-CM | POA: Diagnosis present

## 2021-06-28 DIAGNOSIS — G5603 Carpal tunnel syndrome, bilateral upper limbs: Secondary | ICD-10-CM | POA: Diagnosis not present

## 2021-06-28 HISTORY — DX: Obesity, unspecified: E66.9

## 2021-06-28 HISTORY — DX: Post-traumatic stress disorder, unspecified: F43.10

## 2021-06-28 LAB — BASIC METABOLIC PANEL
Anion gap: 10 (ref 5–15)
BUN: 13 mg/dL (ref 8–23)
CO2: 28 mmol/L (ref 22–32)
Calcium: 9 mg/dL (ref 8.9–10.3)
Chloride: 101 mmol/L (ref 98–111)
Creatinine, Ser: 1.03 mg/dL — ABNORMAL HIGH (ref 0.44–1.00)
GFR, Estimated: 58 mL/min — ABNORMAL LOW (ref >60)
Glucose, Bld: 92 mg/dL (ref 70–99)
Potassium: 3.6 mmol/L (ref 3.5–5.1)
Sodium: 139 mmol/L (ref 135–145)

## 2021-06-28 LAB — CBC WITH DIFFERENTIAL/PLATELET
Abs Immature Granulocytes: 0.02 10*3/uL (ref 0.00–0.07)
Basophils Absolute: 0 10*3/uL (ref 0.0–0.1)
Basophils Relative: 1 %
Eosinophils Absolute: 0.4 10*3/uL (ref 0.0–0.5)
Eosinophils Relative: 6 %
HCT: 38 % (ref 36.0–46.0)
Hemoglobin: 11.7 g/dL — ABNORMAL LOW (ref 12.0–15.0)
Immature Granulocytes: 0 %
Lymphocytes Relative: 30 %
Lymphs Abs: 2.1 10*3/uL (ref 0.7–4.0)
MCH: 27.3 pg (ref 26.0–34.0)
MCHC: 30.8 g/dL (ref 30.0–36.0)
MCV: 88.6 fL (ref 80.0–100.0)
Monocytes Absolute: 0.7 10*3/uL (ref 0.1–1.0)
Monocytes Relative: 10 %
Neutro Abs: 3.6 10*3/uL (ref 1.7–7.7)
Neutrophils Relative %: 53 %
Platelets: 203 10*3/uL (ref 150–400)
RBC: 4.29 MIL/uL (ref 3.87–5.11)
RDW: 15.9 % — ABNORMAL HIGH (ref 11.5–15.5)
WBC: 6.9 10*3/uL (ref 4.0–10.5)
nRBC: 0 % (ref 0.0–0.2)

## 2021-06-28 MED ORDER — CEFAZOLIN IN SODIUM CHLORIDE 3-0.9 GM/100ML-% IV SOLN: 3.0000 g | INTRAVENOUS | Status: DC

## 2021-06-28 NOTE — Patient Instructions (Signed)
Lauren Harmon  06/28/2021     @PREFPERIOPPHARMACY @   Your procedure is scheduled on  07/01/2021.   Report to 07/03/2021 at  0820 A.M.   Call this number if you have problems the morning of surgery:  [phone]   Remember:  Do not eat or drink after midnight.      Take these medicines the morning of surgery with A SIP OF WATER      amlodipine, clonazepam, nexium, pepcid, gabapentin, norco or mobic (if needed), zoloft, effexor.     Do not wear jewelry, make-up or nail polish.  Do not wear lotions, powders, or perfumes, or deodorant.  Do not shave 48 hours prior to surgery.  Men may shave face and neck.  Do not bring valuables to the hospital.  [facility] is not responsible for any belongings or valuables.  Contacts, dentures or bridgework may not be worn into surgery.  Leave your suitcase in the car.  After surgery it may be brought to your room.  For patients admitted to the hospital, discharge time will be determined by your treatment team.  Patients discharged the day of surgery will not be allowed to drive home and must have someone with them for 24 hours.    Special instructions:   DO NOT smoke tobacco or vape fore 24 hours before your procedure.  Please read over the following fact sheets that you were given. Coughing and Deep Breathing, Surgical Site Infection Prevention, Anesthesia Post-op Instructions, and Care and Recovery After Surgery      Open Carpal Tunnel Release, Care After This sheet gives you information about how to care for yourself after your procedure. Your health care provider may also give you more specific instructions. If you have problems or questions, contact your health care provider. What can I expect after the procedure? After the procedure, it is common to have: Pain. Swelling. Wrist stiffness. Bruising. Follow these instructions at home: Medicines Take over-the-counter and prescription medicines only as told by your  health care provider. Ask your health care provider if the medicine prescribed to you: Requires you to avoid driving or using machinery. Can cause constipation. You may need to take these actions to prevent or treat constipation: Drink enough fluid to keep your urine pale yellow. Take over-the-counter or prescription medicines. Eat foods that are high in fiber, such as beans, whole grains, and fresh fruits and vegetables. Limit foods that are high in fat and processed sugars, such as fried or sweet foods. Bathing Do not take baths, swim, or use a hot tub until your health care provider approves. Ask your health care provider if you may take showers. Keep your bandage (dressing) dry until your health care provider says it can be removed. Cover it with a watertight covering when you take a bath or a shower. If you have a splint or brace: Wear the splint or brace as told by your health care provider. You may need to wear it for 2-3 weeks. Remove it only as told by your health care provider. Loosen the splint or brace if your fingers tingle, become numb, or turn cold and blue. Keep the splint or brace clean. If the splint or brace is not waterproof: Do not let it get wet. Cover it with a watertight covering when you take a bath or a shower. Incision care  After the compression bandage has been removed, follow instructions from your health care provider about how to take  care of your incision. Make sure you: Wash your hands with soap and water for at least 20 seconds before and after you change your bandage (dressing). If soap and water are not available, use hand sanitizer. Change your dressing as told by your health care provider. Leave stitches (sutures), skin glue, or adhesive strips in place. These skin closures may need to stay in place for 2 weeks or longer. If adhesive strip edges start to loosen and curl up, you may trim the loose edges. Do not remove adhesive strips completely unless your  health care provider tells you to do that. Check your incision area every day for signs of infection. Check for: Redness. More swelling or pain. Fluid or blood. Warmth. Pus or a bad smell. Managing pain, stiffness, and swelling  If directed, put ice on the affected area. If you have a removable splint or brace, remove it as told by your health care provider. Put ice in a plastic bag. Place a towel between your skin and the bag. Leave the ice on for 20 minutes, 2-3 times a day. Do not fall asleep with ice pack on your skin. Remove the ice if your skin turns bright red. This is very important. If you cannot feel pain, heat, or cold, you have a greater risk of damage to the area. Move your fingers often to avoid stiffness and to lessen swelling. Raise (elevate) your wrist above the level of your heart while you are sitting or lying down. Activity Do not drive until your health care provider approves. Use your hand carefully. Do not do activities that cause pain. You should be able to do light activities with your hand. Do not lift with your affected hand until your health care provider approves. Avoid pulling and pushing with the injured arm. Return to your normal activities as told by your health care provider. Ask your health care provider what activities are safe for you. If physical therapy was prescribed, do exercises as told by your therapist. Physical therapy can help you heal faster and regain movement. General instructions Do not use any products that contain nicotine or tobacco, such as cigarettes and e-cigarettes. These can delay incision healing after surgery. If you need help quitting, ask your health care provider. Keep all follow-up visits. This is important. These include visits for physical therapy. Contact a health care provider if: You have redness around your incision. You have more swelling or pain. You have fluid or blood coming from your incision. Your incision  feels warm to the touch. You have pus or a bad smell coming from your incision. You have a fever or chills. You have pain that does not get better with medicine. Your carpal tunnel symptoms do not go away after 2 months. Your carpal tunnel symptoms go away and then come back. Get help right away if: You have pain or numbness that is getting worse. Your fingers or fingertips become very pale or bluish in color. You are not able to move your fingers. Summary It is common to have wrist stiffness and bruising after a carpal tunnel release. Icing and raising (elevating) your wrist may help to lessen swelling and pain. Call your health care provider if you have a fever or notice any signs of infection in your incision area. This information is not intended to replace advice given to you by your health care provider. Make sure you discuss any questions you have with your health care provider. Document Revised: 09/05/2019 Document Reviewed: 09/05/2019  Elsevier Patient Education  2022 [employer] Monitored Anesthesia Care, Care After This sheet gives you information about how to care for yourself after your procedure. Your health care provider may also give you more specific instructions. If you have problems or questions, contact your health care provider. What can I expect after the procedure? After the procedure, it is common to have: Tiredness. Forgetfulness about what happened after the procedure. Impaired judgment for important decisions. Nausea or vomiting. Some difficulty with balance. Follow these instructions at home: For the time period you were told by your health care provider:   Rest as needed. Do not participate in activities where you could fall or become injured. Do not drive or use machinery. Do not drink alcohol. Do not take sleeping pills or medicines that cause drowsiness. Do not make important decisions or sign legal documents. Do not take care of children on your  own. Eating and drinking Follow the diet that is recommended by your health care provider. Drink enough fluid to keep your urine pale yellow. If you vomit: Drink water, juice, or soup when you can drink without vomiting. Make sure you have little or no nausea before eating solid foods. General instructions Have a responsible adult stay with you for the time you are told. It is important to have someone help care for you until you are awake and alert. Take over-the-counter and prescription medicines only as told by your health care provider. If you have sleep apnea, surgery and certain medicines can increase your risk for breathing problems. Follow instructions from your health care provider about wearing your sleep device: Anytime you are sleeping, including during daytime naps. While taking prescription pain medicines, sleeping medicines, or medicines that make you drowsy. Avoid smoking. Keep all follow-up visits as told by your health care provider. This is important. Contact a health care provider if: You keep feeling nauseous or you keep vomiting. You feel light-headed. You are still sleepy or having trouble with balance after 24 hours. You develop a rash. You have a fever. You have redness or swelling around the IV site. Get help right away if: You have trouble breathing. You have new-onset confusion at home. Summary For several hours after your procedure, you may feel tired. You may also be forgetful and have poor judgment. Have a responsible adult stay with you for the time you are told. It is important to have someone help care for you until you are awake and alert. Rest as told. Do not drive or operate machinery. Do not drink alcohol or take sleeping pills. Get help right away if you have trouble breathing, or if you suddenly become confused. This information is not intended to replace advice given to you by your health care provider. Make sure you discuss any questions you  have with your health care provider. Document Revised: 01/16/2020 Document Reviewed: 04/04/2019 Elsevier Patient Education  2022 Else[employer] Inc. How to Use Chlorhexidine for Bathing Chlorhexidine gluconate (CHG) is a germ-killing (antiseptic) solution that is used to clean the skin. It can get rid of the bacteria that normally live on the skin and can keep them away for about 24 hours. To clean your skin with CHG, you may be given: A CHG solution to use in the shower or as part of a sponge bath. A prepackaged cloth that contains CHG. Cleaning your skin with CHG may help lower the risk for infection: While you are staying in the intensive care unit of the hospital. If you have  a vascular access, such as a central line, to provide short-term or long-term access to your veins. If you have a catheter to drain urine from your bladder. If you are on a ventilator. A ventilator is a machine that helps you breathe by moving air in and out of your lungs. After surgery. What are the risks? Risks of using CHG include: A skin reaction. Hearing loss, if CHG gets in your ears and you have a perforated eardrum. Eye injury, if CHG gets in your eyes and is not rinsed out. The CHG product catching fire. Make sure that you avoid smoking and flames after applying CHG to your skin. Do not use CHG: If you have a chlorhexidine allergy or have previously reacted to chlorhexidine. On babies younger than 26 months of age. How to use CHG solution Use CHG only as told by your health care provider, and follow the instructions on the label. Use the full amount of CHG as directed. Usually, this is one bottle. During a shower Follow these steps when using CHG solution during a shower (unless your health care provider gives you different instructions): Start the shower. Use your normal soap and shampoo to wash your face and hair. Turn off the shower or move out of the shower stream. Pour the CHG onto a clean washcloth.  Do not use any type of brush or rough-edged sponge. Starting at your neck, lather your body down to your toes. Make sure you follow these instructions: If you will be having surgery, pay special attention to the part of your body where you will be having surgery. Scrub this area for at least 1 minute. Do not use CHG on your head or face. If the solution gets into your ears or eyes, rinse them well with water. Avoid your genital area. Avoid any areas of skin that have broken skin, cuts, or scrapes. Scrub your back and under your arms. Make sure to wash skin folds. Let the lather sit on your skin for 1-2 minutes or as long as told by your health care provider. Thoroughly rinse your entire body in the shower. Make sure that all body creases and crevices are rinsed well. Dry off with a clean towel. Do not put any substances on your body afterward--such as powder, lotion, or perfume--unless you are told to do so by your health care provider. Only use lotions that are recommended by the manufacturer. Put on clean clothes or pajamas. If it is the night before your surgery, sleep in clean sheets.  During a sponge bath Follow these steps when using CHG solution during a sponge bath (unless your health care provider gives you different instructions): Use your normal soap and shampoo to wash your face and hair. Pour the CHG onto a clean washcloth. Starting at your neck, lather your body down to your toes. Make sure you follow these instructions: If you will be having surgery, pay special attention to the part of your body where you will be having surgery. Scrub this area for at least 1 minute. Do not use CHG on your head or face. If the solution gets into your ears or eyes, rinse them well with water. Avoid your genital area. Avoid any areas of skin that have broken skin, cuts, or scrapes. Scrub your back and under your arms. Make sure to wash skin folds. Let the lather sit on your skin for 1-2 minutes  or as long as told by your health care provider. Using a different clean, wet  washcloth, thoroughly rinse your entire body. Make sure that all body creases and crevices are rinsed well. Dry off with a clean towel. Do not put any substances on your body afterward--such as powder, lotion, or perfume--unless you are told to do so by your health care provider. Only use lotions that are recommended by the manufacturer. Put on clean clothes or pajamas. If it is the night before your surgery, sleep in clean sheets. How to use CHG prepackaged cloths Only use CHG cloths as told by your health care provider, and follow the instructions on the label. Use the CHG cloth on clean, dry skin. Do not use the CHG cloth on your head or face unless your health care provider tells you to. When washing with the CHG cloth: Avoid your genital area. Avoid any areas of skin that have broken skin, cuts, or scrapes. Before surgery Follow these steps when using a CHG cloth to clean before surgery (unless your health care provider gives you different instructions): Using the CHG cloth, vigorously scrub the part of your body where you will be having surgery. Scrub using a back-and-forth motion for 3 minutes. The area on your body should be completely wet with CHG when you are done scrubbing. Do not rinse. Discard the cloth and let the area air-dry. Do not put any substances on the area afterward, such as powder, lotion, or perfume. Put on clean clothes or pajamas. If it is the night before your surgery, sleep in clean sheets.  For general bathing Follow these steps when using CHG cloths for general bathing (unless your health care provider gives you different instructions). Use a separate CHG cloth for each area of your body. Make sure you wash between any folds of skin and between your fingers and toes. Wash your body in the following order, switching to a new cloth after each step: The front of your neck, shoulders, and  chest. Both of your arms, under your arms, and your hands. Your stomach and groin area, avoiding the genitals. Your right leg and foot. Your left leg and foot. The back of your neck, your back, and your buttocks. Do not rinse. Discard the cloth and let the area air-dry. Do not put any substances on your body afterward--such as powder, lotion, or perfume--unless you are told to do so by your health care provider. Only use lotions that are recommended by the manufacturer. Put on clean clothes or pajamas. Contact a health care provider if: Your skin gets irritated after scrubbing. You have questions about using your solution or cloth. You swallow any chlorhexidine. Call your local poison control center [phone] in the [place] Get help right away if: Your eyes itch badly, or they become very red or swollen. Your skin itches badly and is red or swollen. Your hearing changes. You have trouble seeing. You have swelling or tingling in your mouth or throat. You have trouble breathing. These symptoms may represent a serious problem that is an emergency. Do not wait to see if the symptoms will go away. Get medical help right away. Call your local emergency services (911 in the [place] Do not drive yourself to the hospital. Summary Chlorhexidine gluconate (CHG) is a germ-killing (antiseptic) solution that is used to clean the skin. Cleaning your skin with CHG may help to lower your risk for infection. You may be given CHG to use for bathing. It may be in a bottle or in a prepackaged cloth to use on your skin. Carefully follow your health  care provider's instructions and the instructions on the product label. Do not use CHG if you have a chlorhexidine allergy. Contact your health care provider if your skin gets irritated after scrubbing. This information is not intended to replace advice given to you by your health care provider. Make sure you discuss any questions you have with your health care  provider. Document Revised: 07/13/2020 Document Reviewed: 07/13/2020 Elsevier Patient Education  2022 [employer]

## 2021-07-01 ENCOUNTER — Ambulatory Visit (HOSPITAL_COMMUNITY): Payer: Medicare Other | Admitting: Certified Registered"

## 2021-07-01 ENCOUNTER — Ambulatory Visit (HOSPITAL_BASED_OUTPATIENT_CLINIC_OR_DEPARTMENT_OTHER): Payer: Medicare Other | Admitting: Certified Registered"

## 2021-07-01 ENCOUNTER — Encounter (HOSPITAL_COMMUNITY)
Admission: RE | Disposition: A | Discharge status: Skilled Nursing Facility | Payer: Self-pay | Source: Home / Self Care | Attending: Orthopedic Surgery

## 2021-07-01 ENCOUNTER — Encounter (HOSPITAL_COMMUNITY): Payer: Self-pay | Admitting: Orthopedic Surgery

## 2021-07-01 ENCOUNTER — Ambulatory Visit (HOSPITAL_COMMUNITY)
Admission: RE | Admit: 2021-07-01 | Discharge: 2021-07-01 | Disposition: A | Discharge status: Skilled Nursing Facility | Payer: Medicare Other | Attending: Orthopedic Surgery | Admitting: Orthopedic Surgery

## 2021-07-01 ENCOUNTER — Other Ambulatory Visit: Payer: Self-pay

## 2021-07-01 DIAGNOSIS — K219 Gastro-esophageal reflux disease without esophagitis: Secondary | ICD-10-CM | POA: Insufficient documentation

## 2021-07-01 DIAGNOSIS — I10 Essential (primary) hypertension: Secondary | ICD-10-CM

## 2021-07-01 DIAGNOSIS — F419 Anxiety disorder, unspecified: Secondary | ICD-10-CM | POA: Insufficient documentation

## 2021-07-01 DIAGNOSIS — G5602 Carpal tunnel syndrome, left upper limb: Secondary | ICD-10-CM | POA: Insufficient documentation

## 2021-07-01 DIAGNOSIS — J45909 Unspecified asthma, uncomplicated: Secondary | ICD-10-CM | POA: Insufficient documentation

## 2021-07-01 DIAGNOSIS — Z6841 Body Mass Index (BMI) 40.0 and over, adult: Secondary | ICD-10-CM | POA: Insufficient documentation

## 2021-07-01 DIAGNOSIS — F418 Other specified anxiety disorders: Secondary | ICD-10-CM

## 2021-07-01 DIAGNOSIS — F32A Depression, unspecified: Secondary | ICD-10-CM | POA: Insufficient documentation

## 2021-07-01 HISTORY — PX: CARPAL TUNNEL RELEASE: SHX101

## 2021-07-01 SURGERY — CARPAL TUNNEL RELEASE
Anesthesia: General | Site: Hand | Laterality: Left

## 2021-07-01 MED ORDER — PHENYLEPHRINE 40 MCG/ML (10ML) SYRINGE FOR IV PUSH (FOR BLOOD PRESSURE SUPPORT)
PREFILLED_SYRINGE | INTRAVENOUS | Status: DC | PRN
Start: 2021-07-01 — End: 2021-07-01
  Administered 2021-07-01: 80 ug via INTRAVENOUS

## 2021-07-01 MED ORDER — 0.9 % SODIUM CHLORIDE (POUR BTL) OPTIME
TOPICAL | Status: DC | PRN
  Administered 2021-07-01: 1000 mL

## 2021-07-01 MED ORDER — FENTANYL CITRATE PF 50 MCG/ML IJ SOSY: 25.0000 ug | PREFILLED_SYRINGE | INTRAMUSCULAR | Status: DC | PRN

## 2021-07-01 MED ORDER — DEXMEDETOMIDINE (PRECEDEX) IN NS 20 MCG/5ML (4 MCG/ML) IV SYRINGE
PREFILLED_SYRINGE | INTRAVENOUS | Status: AC
  Filled 2021-07-01: qty 5

## 2021-07-01 MED ORDER — CHLORHEXIDINE GLUCONATE 0.12 % MT SOLN
15.0000 mL | Freq: Once | OROMUCOSAL | Status: AC
  Administered 2021-07-01: 15 mL via OROMUCOSAL

## 2021-07-01 MED ORDER — EPHEDRINE 5 MG/ML INJ
INTRAVENOUS | Status: AC
  Filled 2021-07-01: qty 5

## 2021-07-01 MED ORDER — PHENYLEPHRINE 40 MCG/ML (10ML) SYRINGE FOR IV PUSH (FOR BLOOD PRESSURE SUPPORT)
PREFILLED_SYRINGE | INTRAVENOUS | Status: AC
  Filled 2021-07-01: qty 30

## 2021-07-01 MED ORDER — LACTATED RINGERS IV SOLN: INTRAVENOUS | Status: DC

## 2021-07-01 MED ORDER — EPHEDRINE SULFATE-NACL 50-0.9 MG/10ML-% IV SOSY
PREFILLED_SYRINGE | INTRAVENOUS | Status: DC | PRN
  Administered 2021-07-01 (×2): 5 mg via INTRAVENOUS

## 2021-07-01 MED ORDER — ONDANSETRON HCL 4 MG/2ML IJ SOLN
INTRAMUSCULAR | Status: AC
  Filled 2021-07-01: qty 2

## 2021-07-01 MED ORDER — KETOROLAC TROMETHAMINE 30 MG/ML IJ SOLN
INTRAMUSCULAR | Status: DC | PRN
Start: 2021-07-01 — End: 2021-07-01
  Administered 2021-07-01: 30 mg via INTRAVENOUS

## 2021-07-01 MED ORDER — LIDOCAINE 2% (20 MG/ML) 5 ML SYRINGE
INTRAMUSCULAR | Status: DC | PRN
  Administered 2021-07-01: 100 mg via INTRAVENOUS

## 2021-07-01 MED ORDER — FENTANYL CITRATE (PF) 100 MCG/2ML IJ SOLN
INTRAMUSCULAR | Status: AC
  Filled 2021-07-01: qty 2

## 2021-07-01 MED ORDER — DEXAMETHASONE SODIUM PHOSPHATE 4 MG/ML IJ SOLN
INTRAMUSCULAR | Status: DC | PRN
  Administered 2021-07-01: 5 mg via INTRAVENOUS

## 2021-07-01 MED ORDER — BUPIVACAINE HCL (PF) 0.5 % IJ SOLN
INTRAMUSCULAR | Status: AC
  Filled 2021-07-01: qty 30

## 2021-07-01 MED ORDER — PROPOFOL 10 MG/ML IV BOLUS
INTRAVENOUS | Status: DC | PRN
  Administered 2021-07-01: 130 mg via INTRAVENOUS
  Administered 2021-07-01: 70 mg via INTRAVENOUS

## 2021-07-01 MED ORDER — MIDAZOLAM HCL 2 MG/2ML IJ SOLN
INTRAMUSCULAR | Status: AC
  Filled 2021-07-01: qty 2

## 2021-07-01 MED ORDER — HYDROCODONE-ACETAMINOPHEN 5-325 MG PO TABS: 1.0000 | ORAL_TABLET | Freq: Four times a day (QID) | ORAL | 0 refills | Status: AC | PRN

## 2021-07-01 MED ORDER — ROCURONIUM BROMIDE 10 MG/ML (PF) SYRINGE
PREFILLED_SYRINGE | INTRAVENOUS | Status: DC | PRN
  Administered 2021-07-01: 30 mg via INTRAVENOUS

## 2021-07-01 MED ORDER — BUPIVACAINE HCL (PF) 0.5 % IJ SOLN
INTRAMUSCULAR | Status: DC | PRN
  Administered 2021-07-01: 10 mL

## 2021-07-01 MED ORDER — FENTANYL CITRATE (PF) 250 MCG/5ML IJ SOLN
INTRAMUSCULAR | Status: DC | PRN
Start: 2021-07-01 — End: 2021-07-01
  Administered 2021-07-01: 75 ug via INTRAVENOUS
  Administered 2021-07-01: 25 ug via INTRAVENOUS

## 2021-07-01 MED ORDER — DEXMEDETOMIDINE (PRECEDEX) IN NS 20 MCG/5ML (4 MCG/ML) IV SYRINGE
PREFILLED_SYRINGE | INTRAVENOUS | Status: DC | PRN
  Administered 2021-07-01: 20 ug via INTRAVENOUS

## 2021-07-01 MED ORDER — CEFAZOLIN IN SODIUM CHLORIDE 3-0.9 GM/100ML-% IV SOLN
3.0000 g | Freq: Once | INTRAVENOUS | Status: AC
  Administered 2021-07-01: 3 g via INTRAVENOUS
  Filled 2021-07-01: qty 100

## 2021-07-01 MED ORDER — SUGAMMADEX SODIUM 200 MG/2ML IV SOLN
INTRAVENOUS | Status: DC | PRN
  Administered 2021-07-01: 200 mg via INTRAVENOUS

## 2021-07-01 MED ORDER — ONDANSETRON HCL 4 MG/2ML IJ SOLN
INTRAMUSCULAR | Status: DC | PRN
  Administered 2021-07-01: 4 mg via INTRAVENOUS

## 2021-07-01 MED ORDER — LIDOCAINE HCL (PF) 2 % IJ SOLN
INTRAMUSCULAR | Status: AC
  Filled 2021-07-01: qty 5

## 2021-07-01 MED ORDER — ORAL CARE MOUTH RINSE: 15.0000 mL | Freq: Once | OROMUCOSAL | Status: AC

## 2021-07-01 MED ORDER — SUCCINYLCHOLINE CHLORIDE 200 MG/10ML IV SOSY
PREFILLED_SYRINGE | INTRAVENOUS | Status: DC | PRN
  Administered 2021-07-01: 100 mg via INTRAVENOUS

## 2021-07-01 MED ORDER — MIDAZOLAM HCL 2 MG/2ML IJ SOLN
INTRAMUSCULAR | Status: DC | PRN
  Administered 2021-07-01 (×2): 1 mg via INTRAVENOUS

## 2021-07-01 MED ORDER — KETOROLAC TROMETHAMINE 30 MG/ML IJ SOLN
INTRAMUSCULAR | Status: AC
  Filled 2021-07-01: qty 1

## 2021-07-01 MED ORDER — PROPOFOL 10 MG/ML IV BOLUS
INTRAVENOUS | Status: AC
  Filled 2021-07-01: qty 20

## 2021-07-01 SURGICAL SUPPLY — 43 items
ADH SKN CLS APL DERMABOND .7 (GAUZE/BANDAGES/DRESSINGS) ×1
APL PRP STRL LF DISP 70% ISPRP (MISCELLANEOUS) ×1
BANDAGE ESMARK 4X12 BL STRL LF (DISPOSABLE) ×2 IMPLANT
BLADE SURG 15 STRL LF DISP TIS (BLADE) ×2 IMPLANT
BLADE SURG 15 STRL SS (BLADE) ×2
BNDG CMPR 12X4 ELC STRL LF (DISPOSABLE) ×1
BNDG CMPR 5X2 CHSV 1 LYR STRL (GAUZE/BANDAGES/DRESSINGS) ×1
BNDG CMPR STD VLCR NS LF 5.8X3 (GAUZE/BANDAGES/DRESSINGS) ×1
BNDG COHESIVE 2X5 TAN ST LF (GAUZE/BANDAGES/DRESSINGS) ×3 IMPLANT
BNDG ELASTIC 3X5.8 VLCR NS LF (GAUZE/BANDAGES/DRESSINGS) ×3 IMPLANT
BNDG ESMARK 4X12 BLUE STRL LF (DISPOSABLE) ×2
BNDG GAUZE ELAST 4 BULKY (GAUZE/BANDAGES/DRESSINGS) ×1 IMPLANT
CHLORAPREP W/TINT 26 (MISCELLANEOUS) ×3 IMPLANT
CLOTH BEACON ORANGE TIMEOUT ST (SAFETY) ×3 IMPLANT
COVER LIGHT HANDLE STERIS (MISCELLANEOUS) ×6 IMPLANT
CUFF TOURN SGL QUICK 18X4 (TOURNIQUET CUFF) ×3 IMPLANT
DERMABOND ADVANCED (GAUZE/BANDAGES/DRESSINGS) ×1
DERMABOND ADVANCED .7 DNX12 (GAUZE/BANDAGES/DRESSINGS) ×2 IMPLANT
DRAPE HALF SHEET 40X57 (DRAPES) ×3 IMPLANT
GAUZE 4X4 16PLY ~~LOC~~+RFID DBL (SPONGE) ×3 IMPLANT
GAUZE SPONGE 4X4 12PLY STRL (GAUZE/BANDAGES/DRESSINGS) ×3 IMPLANT
GAUZE XEROFORM 1X8 LF (GAUZE/BANDAGES/DRESSINGS) ×3 IMPLANT
GLOVE SRG 8 PF TXTR STRL LF DI (GLOVE) ×2 IMPLANT
GLOVE SURG POLYISO LF SZ8 (GLOVE) ×8 IMPLANT
GLOVE SURG UNDER POLY LF SZ7 (GLOVE) ×6 IMPLANT
GLOVE SURG UNDER POLY LF SZ8 (GLOVE) ×4
GOWN STRL REUS W/ TWL XL LVL3 (GOWN DISPOSABLE) ×2 IMPLANT
GOWN STRL REUS W/TWL LRG LVL3 (GOWN DISPOSABLE) ×3 IMPLANT
GOWN STRL REUS W/TWL XL LVL3 (GOWN DISPOSABLE) ×2
KIT TURNOVER KIT A (KITS) ×3 IMPLANT
MANIFOLD NEPTUNE II (INSTRUMENTS) ×3 IMPLANT
NDL HYPO 21X1.5 SAFETY (NEEDLE) ×2 IMPLANT
NEEDLE HYPO 21X1.5 SAFETY (NEEDLE) ×2 IMPLANT
NS IRRIG 1000ML POUR BTL (IV SOLUTION) ×3 IMPLANT
PACK BASIC LIMB (CUSTOM PROCEDURE TRAY) ×3 IMPLANT
PAD ARMBOARD 7.5X6 YLW CONV (MISCELLANEOUS) ×3 IMPLANT
POSITIONER HAND ALUMI XLG (MISCELLANEOUS) ×3 IMPLANT
SET BASIN LINEN APH (SET/KITS/TRAYS/PACK) ×3 IMPLANT
SPONGE GAUZE 4X4 12PLY (GAUZE/BANDAGES/DRESSINGS) ×1 IMPLANT
SUT MON AB 3-0 SH 27 (SUTURE) ×3 IMPLANT
SUT PROLENE NAB BLUE 3-0 30IN (SUTURE) ×1 IMPLANT
SYR CONTROL 10ML LL (SYRINGE) ×3 IMPLANT
UNDERPAD 30X36 HEAVY ABSORB (UNDERPADS AND DIAPERS) ×3 IMPLANT

## 2021-07-01 NOTE — Progress Notes (Signed)
This RN gave report to Vickey Huger, nurse at [facility]

## 2021-07-01 NOTE — Anesthesia Procedure Notes (Signed)
Procedure Name: Intubation Date/Time: 07/01/2021 11:08 AM Performed by: Orlie Dakin, CRNA Pre-anesthesia Checklist: Patient identified, Emergency Drugs available, Suction available and Patient being monitored Patient Re-evaluated:Patient Re-evaluated prior to induction Oxygen Delivery Method: Circle system utilized Preoxygenation: Pre-oxygenation with 100% oxygen Induction Type: IV induction Laryngoscope Size: Mac and 4 Grade View: Grade II Tube type: Oral Tube size: 7.0 mm Number of attempts: 1 Airway Equipment and Method: Stylet Placement Confirmation: ETT inserted through vocal cords under direct vision, positive ETCO2 and breath sounds checked- equal and bilateral Secured at: 23 cm Tube secured with: Tape Dental Injury: Teeth and Oropharynx as per pre-operative assessment

## 2021-07-01 NOTE — Discharge Instructions (Signed)
  Kennet Mccort A. Caran Storck, MD MS  [facility] [address] [place],  [place]  [address] Phone: [phone] Fax: [phone]    POST-OPERATIVE INSTRUCTIONS   WOUND CARE You may remove your bandage on postop day 3 and get the hand wet.  No ointments or lotions to be applied to the incision.  Do not submerge the incision for 1 month.  FOLLOW-UP If you develop a Fever (>101.5), Redness or Drainage from the surgical incision site, please call our office to arrange for an evaluation. Please call the office to schedule a follow-up appointment for your incision check if you do not already have one, 7-10 days post-operatively.  IF YOU HAVE ANY QUESTIONS, PLEASE FEEL FREE TO CALL OUR OFFICE.  HELPFUL INFORMATION  You should wean off your narcotic medicines as soon as you are able.  Most patients will be off or using minimal narcotics before their first postop appointment.   You may be more comfortable sleeping in a semi-seated position the first few nights following surgery.  Keep a pillow propped under the elbow and forearm for comfort.  If you have a recliner type of chair it might be beneficial.    We suggest you use the pain medication the first night prior to going to bed, in order to ease any pain when the anesthesia wears off. You should avoid taking pain medications on an empty stomach as it will make you nauseous.  Do not drink alcoholic beverages or take illicit drugs when taking pain medications.  You may return to work/school in the next couple of days when you feel up to it. Desk work and typing is fine.  Pain medication may make you constipated.  Below are a few solutions to try in this order: Decrease the amount of pain medication if you aren't having pain. Drink lots of decaffeinated fluids. Drink prune juice and/or each dried prunes  If the first 3 don't work start with additional solutions Take Colace - an over-the-counter stool softener Take  Senokot - an over-the-counter laxative Take Miralax - a stronger over-the-counter laxative    

## 2021-07-01 NOTE — Anesthesia Postprocedure Evaluation (Signed)
Anesthesia Post Note  Patient: Lauren Harmon  Procedure(s) Performed: LEFT CARPAL TUNNEL RELEASE (Left: Hand)  Patient location during evaluation: Phase II Anesthesia Type: General Level of consciousness: awake Pain management: pain level controlled Vital Signs Assessment: post-procedure vital signs reviewed and stable Respiratory status: spontaneous breathing and respiratory function stable Cardiovascular status: blood pressure returned to baseline and stable Postop Assessment: no headache and no apparent nausea or vomiting Anesthetic complications: no Comments: Late entry   No notable events documented.   Last Vitals:  Vitals:   07/01/21 1200 07/01/21 1225  BP:  126/63  Pulse:  72  Resp:  16  Temp:  36.6 C  SpO2: 91% 98%    Last Pain:  Vitals:   07/01/21 1225  TempSrc: Oral  PainSc: 0-No pain                 Windell Norfolk

## 2021-07-01 NOTE — Transfer of Care (Signed)
Immediate Anesthesia Transfer of Care Note  Patient: Lauren Harmon  Procedure(s) Performed: LEFT CARPAL TUNNEL RELEASE (Left: Hand)  Patient Location: PACU  Anesthesia Type:General  Level of Consciousness: awake  Airway & Oxygen Therapy: Patient Spontanous Breathing and Patient connected to face mask oxygen  Post-op Assessment: Report given to RN and Post -op Vital signs reviewed and stable  Post vital signs: Reviewed and stable  Last Vitals:  Vitals Value Taken Time  BP    Temp    Pulse 58 07/01/21 1151  Resp 9 07/01/21 1151  SpO2 99 % 07/01/21 1151  Vitals shown include unvalidated device data.  Last Pain:  Vitals:   07/01/21 0906  TempSrc: Oral  PainSc: 6       Patients Stated Pain Goal: 7 (07/01/21 0906)  Complications: No notable events documented.

## 2021-07-01 NOTE — Anesthesia Preprocedure Evaluation (Addendum)
Anesthesia Evaluation  Patient identified by MRN, date of birth, ID band Patient awake    Reviewed: Allergy & Precautions, H&P , NPO status , Patient's Chart, lab work & pertinent test results, reviewed documented beta blocker date and time   Airway Mallampati: II  TM Distance: >3 FB Neck ROM: full    Dental  (+) Missing, Dental Advisory Given   Pulmonary asthma ,    Pulmonary exam normal breath sounds clear to auscultation       Cardiovascular Exercise Tolerance: Good hypertension, negative cardio ROS   Rhythm:regular Rate:Normal     Neuro/Psych PSYCHIATRIC DISORDERS Anxiety Depression negative neurological ROS     GI/Hepatic Neg liver ROS, GERD  Medicated,  Endo/Other  Morbid obesity  Renal/GU negative Renal ROS  negative genitourinary   Musculoskeletal   Abdominal   Peds  Hematology negative hematology ROS (+)   Anesthesia Other Findings   Reproductive/Obstetrics negative OB ROS                            Anesthesia Physical Anesthesia Plan  ASA: 3  Anesthesia Plan: General and General ETT   Post-op Pain Management:    Induction:   PONV Risk Score and Plan: Ondansetron  Airway Management Planned:   Additional Equipment:   Intra-op Plan:   Post-operative Plan:   Informed Consent: I have reviewed the patients History and Physical, chart, labs and discussed the procedure including the risks, benefits and alternatives for the proposed anesthesia with the patient or authorized representative who has indicated his/her understanding and acceptance.     Dental Advisory Given  Plan Discussed with: CRNA  Anesthesia Plan Comments:         Anesthesia Quick Evaluation

## 2021-07-01 NOTE — Op Note (Signed)
Orthopaedic Surgery Operative Note (CSN: [id number])  Lauren Harmon  11-01-1950 Date of Surgery: 07/01/2021   Diagnoses:  CARPAL TUNNEL SYNDROME, LEFT  Procedure: Left Open Carpal Tunnel Release   Operative Finding Successful completion of the planned procedure.  No complications   Post-Op Diagnosis: Same Surgeons:Primary: Oliver Barre, MD Assistants:  None Location: [facility] OR ROOM 4 Anesthesia: General with local anesthesia Antibiotics: Ancef 3 g Tourniquet time:  Total Tourniquet Time Documented: Upper Arm (Left) - 20 minutes Total: Upper Arm (Left) - 20 minutes  Estimated Blood Loss: Minimal Complications: None Specimens: None Implants: None  Indications for Surgery:   Lauren Harmon is a 70 y.o. female with numbness, tingling and pain in the median nerve distribution.  Symptoms consistent with carpal tunnel syndrome.  EMG confirmed diagnosis.  Benefits and risks of operative and nonoperative management were discussed prior to surgery with patient/guardian(s) and informed consent form was completed.  Specific risks including infection, need for additional surgery, persistent pain, numbness and tingling and more severe complications associated with anesthesia.  She elected to proceed.  Surgical consent was finalized.    Procedure:   The patient was identified properly. Informed consent was obtained and the surgical site was marked. The patient was taken to the OR where general anesthesia was induced.  The patient was positioned supine on a hand table.  The left arm was prepped and draped in the usual sterile fashion.  Timeout was performed before the beginning of the case.  Tourniquet was used for the above duration.  She received 3 g of Ancef prior to making incision.   A skin Incision was made in line with the radial border of the ring finger.  We dissected bluntly through subcutaneous tissue.  The carpal tunnel transverse fascia was identified, cleaned, and incised  sharply. The common sensory branches were visualized along with the superficial palmar arch and protected.  The median nerve was protected below. Deep retractors were placed underneath the transverse carpal ligament, protecting the nerve. I released the ligament completely, and then released the proximal distal volar forearm fascia. The nerve was identified, and visualized, and protected throughout the case. No masses or abnormalities were identified in ulnar bursa.  The wounds were irrigated copiously, and the wounds injected, and the skin closed with sutures and a bulky dressing. Patient  tolerated this well, with no complications.  Patient was awoken taken to PACU in stable condition.  Post-operative plan:  The patient will be discharged home from the PACU. WBAT on the operative extremity; limit lifting to nothing more than a coffee cup until follow up appointment   DVT prophylaxis Aspirin 81 mg twice daily for 6 weeks.    Pain control with PRN pain medication preferring oral medicines.   Follow up plan will be scheduled in approximately 7-10 days for incision check

## 2021-07-01 NOTE — Interval H&P Note (Signed)
History and Physical Interval Note:  07/01/2021 10:38 AM  Lauren Harmon  has presented today for surgery, with the diagnosis of CARPAL TUNNEL SYNDROME, LEFT.  The various methods of treatment have been discussed with the patient and family. After consideration of risks, benefits and other options for treatment, the patient has consented to  Procedure(s): LEFT CARPAL TUNNEL RELEASE (Left) as a surgical intervention.  The patient's history has been reviewed, patient examined, no change in status, stable for surgery.  I have reviewed the patient's chart and labs.  Questions were answered to the patient's satisfaction.     Mordecai Rasmussen

## 2021-07-01 NOTE — Progress Notes (Signed)
Pt states she has pain meds at facility if she needs them.

## 2021-07-05 ENCOUNTER — Encounter (HOSPITAL_COMMUNITY): Payer: Self-pay | Admitting: Orthopedic Surgery

## 2021-07-09 ENCOUNTER — Other Ambulatory Visit: Payer: Self-pay

## 2021-07-09 ENCOUNTER — Ambulatory Visit (INDEPENDENT_AMBULATORY_CARE_PROVIDER_SITE_OTHER): Payer: Medicare Other | Admitting: Orthopedic Surgery

## 2021-07-09 ENCOUNTER — Encounter: Payer: Self-pay | Admitting: Orthopedic Surgery

## 2021-07-09 VITALS — Ht 66.0 in | Wt 326.0 lb

## 2021-07-09 DIAGNOSIS — G5602 Carpal tunnel syndrome, left upper limb: Secondary | ICD-10-CM

## 2021-07-09 NOTE — Progress Notes (Signed)
Orthopaedic Postop Note  Assessment: Lauren Harmon is a 71 y.o. female s/p left open carpal tunnel release   DOS: 07/01/21  Plan: Sutures removed, steri strips placed  Complete resolution of numbness and tingling.  Pain in hands has improved.   Okay to wash her hands.  Do not scrub at the incision.  Pat the incision dry.  Steri-Strips will fall off when they fall off.  Okay to work on range of motion.  No lifting anything greater than a coffee cup.  Follow-up in 4 weeks.   Follow-up: Return in about 4 weeks (around 08/06/2021). XR at next visit: None  Subjective:  Chief Complaint  Patient presents with   Routine Post Op    Lt  hand CTR DOS 07/01/21     History of Present Illness: Lauren Harmon is a 71 y.o. female who presents following the above stated procedure.  Surgery was approximately 1 week ago.  She is doing well.  Her pain is controlled.  She reports the pain in her hand has improved compared to before surgery.  She states that the numbness and tingling she was experiencing is completely resolved  Review of Systems: No fevers or chills No numbness or tingling No Chest Pain No shortness of breath   Objective: Ht 5\' 6"  (1.676 m)    Wt (!) 326 lb (147.9 kg)    LMP 08/09/2002    BMI 52.62 kg/m   Physical Exam:  Surgical incisions healing well.  There is some residual scabbing.  No surrounding erythema or drainage.  Sensation is normal within the median nerve distribution.  Fingers are warm and well-perfused.  IMAGING: I personally ordered and reviewed the following images:  No new imaging obtained today.  08/11/2002, MD 07/09/2021 1:14 PM

## 2021-07-09 NOTE — Patient Instructions (Signed)
Okay to wash her hands.  Do not scrub at the incision.  Pat the incision dry.  Steri-Strips will fall off when they fall off.  Okay to work on range of motion.  No lifting anything greater than a coffee cup.  Follow-up in 4 weeks.

## 2021-08-06 ENCOUNTER — Other Ambulatory Visit: Payer: Self-pay

## 2021-08-06 ENCOUNTER — Ambulatory Visit (INDEPENDENT_AMBULATORY_CARE_PROVIDER_SITE_OTHER): Payer: Medicare Other | Admitting: Orthopedic Surgery

## 2021-08-06 ENCOUNTER — Encounter: Payer: Self-pay | Admitting: Orthopedic Surgery

## 2021-08-06 VITALS — Ht 66.0 in | Wt 326.0 lb

## 2021-08-06 DIAGNOSIS — G5602 Carpal tunnel syndrome, left upper limb: Secondary | ICD-10-CM

## 2021-08-06 NOTE — Progress Notes (Signed)
Orthopaedic Postop Note ? ?Assessment: ?Lauren Harmon is a 71 y.o. female s/p left open carpal tunnel release  ? ?DOS: 07/01/21 ? ?Plan: ?Healing well following surgery.  Numbness and tingling has completely resolved in the left hand.  Occasional shooting pain into the left thenar eminence.  This will continue to improve with time.  I provided reassurance.  Okay to submerge the hand.  Gentle massage of the incision will limit scarring.  Keep working on range of motion.  Okay to advance activities as tolerated.  If symptoms worsen in the right hand, I would be happy to see her in clinic, with further consideration for surgery.  No follow-up needed at this time. ? ?Follow-up: ?Return if symptoms worsen or fail to improve. ?XR at next visit: None ? ?Subjective: ? ?Chief Complaint  ?Patient presents with  ? Routine Post Op  ?  Lt CTR DOS 07/01/21  ? ? ?History of Present Illness: ?Lauren Harmon is a 71 y.o. female who presents following the above stated procedure.  Surgery was approximately 5-6 weeks ago.  She is doing well.  She denies numbness and tingling in the left hand.  Occasional shooting pains into the left thumb area.  No issues with her surgical incision.  She denies any numbness or tingling in her right hand currently.  She does have some pain in the index finger, but no recent injury.  She may have bumped it but this was a while ago.   ? ?Review of Systems: ?No fevers or chills ?No numbness or tingling ?No Chest Pain ?No shortness of breath ? ? ?Objective: ?Ht 5\' 6"  (1.676 m)   Wt (!) 326 lb (147.9 kg)   LMP 08/09/2002   BMI 52.62 kg/m?  ? ?Physical Exam: ? ?Surgical incision is healing well.  There is some scar formation in line with the incision.  No tenderness to palpation.  No numbness or tingling in the median nerve distribution.  Grip strength is 4/5. ? ?Evaluation of the right hand demonstrates intact sensation within the median nerve distribution.  She states she has some pain at the PIP  joint to the index finger, but there is no swelling in this area.  She is able to make a full fist.  No pain over the A1 pulley. ? ? ?IMAGING: ?I personally ordered and reviewed the following images: ? ?No new imaging obtained today. ? ?08/11/2002, MD ?08/06/2021 ?11:56 AM ? ? ?

## 2021-12-27 ENCOUNTER — Telehealth: Payer: Self-pay | Admitting: Orthopedic Surgery

## 2021-12-27 NOTE — Telephone Encounter (Signed)
Per voice message from [facility] facility, per Amy at ph#[phone], requesting appointment for wrist, due to swelling (post surgery).  I returned call, reached Amy's voice mail, left message offering appointment in

## 2021-12-29 ENCOUNTER — Ambulatory Visit: Payer: Self-pay

## 2021-12-29 ENCOUNTER — Encounter: Payer: Self-pay | Admitting: Orthopedic Surgery

## 2021-12-29 ENCOUNTER — Ambulatory Visit (INDEPENDENT_AMBULATORY_CARE_PROVIDER_SITE_OTHER): Payer: Medicare Other | Admitting: Orthopedic Surgery

## 2021-12-29 ENCOUNTER — Ambulatory Visit (INDEPENDENT_AMBULATORY_CARE_PROVIDER_SITE_OTHER): Payer: Medicare Other

## 2021-12-29 DIAGNOSIS — M79642 Pain in left hand: Secondary | ICD-10-CM | POA: Diagnosis not present

## 2021-12-29 DIAGNOSIS — M79641 Pain in right hand: Secondary | ICD-10-CM | POA: Diagnosis not present

## 2021-12-29 NOTE — Progress Notes (Unsigned)
Orthopaedic Postop Note  Assessment: Lauren Harmon is a 71 y.o. female s/p left open carpal tunnel release; bilateral wrist pain  DOS: 07/01/21  Plan: Lauren Harmon is having some pain in both of her hands.  Following surgery in her left hand, the numbness and tingling has improved.  She continues to have some burning pain in the palm of the left hand.  Otherwise, she is complaining of pain across the wrist.  She is also complaining of pain in her right hand, in a similar distribution.  She states she has pain into the right index finger.  EMG previously demonstrated to have mild to moderate symptoms, but I am not convinced that these of the symptoms that are causing her discomfort right now.  When attempting to elicit signs of median nerve irritation, she complained of pain directly overlying the carpal tunnel, as well as the numbness and tingling directly at the carpal tunnel.  As such, I do not think that she will benefit from proceeding with open carpal tunnel release on the right, as she is not completely satisfied with the results on the left.  I do think her symptoms will continue to improve in the left hand.  In regards to bilateral wrist pain, she does have some arthritis, which is not severe.  I have recommended she continue to take her medicines, she is a chronic pain patient.  Otherwise, she will follow-up as needed.  Follow-up: No follow-ups on file. XR at next visit: None  Subjective:  Chief Complaint  Patient presents with   Routine Post Op    LT CTR having pain and swelling   Wrist Pain    Rt wrist pain and swelling.     History of Present Illness: Lauren Harmon is a 71 y.o. female who presents following the above stated procedure.  Surgery was approximately 6 months ago.  She is complaining of swelling in the left hand, as well as a wrist pain in both hands.  She also has pain radiating into the index finger on the right hand.  Improved numbness and tingling in the  left hand.  She states she does have some numbness and tingling in the right hand, but is mostly painful.  She is localizing the pain primarily to the MCP joint of the index finger.  No specific injury.  Swelling in the left hand is intermittent.   Review of Systems: No fevers or chills No numbness or tingling No Chest Pain No shortness of breath   Objective: LMP 08/09/2002   Physical Exam:  Surgical incision is healing well.  There is some scar formation in line with the incision.  No tenderness to palpation.  No numbness or tingling in the median nerve distribution.  Grip strength is 4/5.  She has some tenderness in the wrist diffuse.  Tenderness to palpation at the base of the thumb.  Right hand without swelling.  Sensation is intact in the median nerve distribution.  She has pain at the MCP joint to the index finger.  She is able to make a full fist.  She has some tenderness to palpation along the wrist.  Ears are warm and well-perfused.   IMAGING: I personally ordered and reviewed the following images:  X-rays of bilateral hands were obtained in clinic today.  No acute injuries are noted.  Mild degenerative changes noted throughout her fingers.  There is some loss of joint space within the wrist of both hands.  Small osteophytes are noted.  No dislocations.  There is a chronic appearing, well-corticated bone fragment at the ulnar styloid to the left hand.  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

## 2021-12-30 ENCOUNTER — Encounter: Payer: Self-pay | Admitting: Orthopedic Surgery

## 2022-12-14 ENCOUNTER — Ambulatory Visit: Payer: Medicare Other | Admitting: Urology

## 2022-12-14 ENCOUNTER — Telehealth: Payer: Self-pay

## 2022-12-14 ENCOUNTER — Other Ambulatory Visit: Payer: Self-pay

## 2022-12-14 DIAGNOSIS — N200 Calculus of kidney: Secondary | ICD-10-CM

## 2022-12-14 NOTE — Telephone Encounter (Signed)
Unit nurse called and made aware patient will need to go by [facility] radiology department and have KUB done prior to appointment. Nurse voiced understanding.

## 2023-06-27 ENCOUNTER — Other Ambulatory Visit (HOSPITAL_COMMUNITY): Payer: Self-pay | Admitting: Internal Medicine

## 2023-06-27 DIAGNOSIS — A499 Bacterial infection, unspecified: Secondary | ICD-10-CM

## 2023-06-29 ENCOUNTER — Other Ambulatory Visit (HOSPITAL_COMMUNITY): Payer: Medicare Other

## 2023-06-30 ENCOUNTER — Ambulatory Visit (HOSPITAL_COMMUNITY)
Admission: RE | Admit: 2023-06-30 | Discharge: 2023-06-30 | Disposition: A | Discharge status: Home / Self Care | Payer: Medicare Other | Source: Ambulatory Visit | Attending: Internal Medicine | Admitting: Internal Medicine

## 2023-06-30 DIAGNOSIS — A499 Bacterial infection, unspecified: Secondary | ICD-10-CM | POA: Diagnosis present

## 2023-06-30 MED ORDER — HEPARIN SOD (PORK) LOCK FLUSH 100 UNIT/ML IV SOLN
INTRAVENOUS | Status: AC
  Filled 2023-06-30: qty 5

## 2023-06-30 MED ORDER — LIDOCAINE HCL 1 % IJ SOLN
20.0000 mL | Freq: Once | INTRAMUSCULAR | Status: AC
  Administered 2023-06-30: 6 mL via INTRADERMAL

## 2023-06-30 MED ORDER — LIDOCAINE HCL 1 % IJ SOLN
INTRAMUSCULAR | Status: AC
  Filled 2023-06-30: qty 20

## 2023-06-30 MED ORDER — HEPARIN SOD (PORK) LOCK FLUSH 100 UNIT/ML IV SOLN
500.0000 [IU] | Freq: Once | INTRAVENOUS | Status: AC
  Administered 2023-06-30: 500 [IU] via INTRAVENOUS

## 2023-06-30 NOTE — Procedures (Signed)
Interventional Radiology Procedure:   Indications: Needs PICC line for infection  Procedure: Placement of right arm PICC line  Findings: Right brachial Power PICC line, single lumen, 39 cm, tip at SVC/RA junction  Complications: None     EBL: Minimal  Plan: PICC line is ready to use.   Jazlyne Gauger R. Lowella Dandy, MD  Pager: [phone]

## 2023-08-15 DEATH — deceased
